# Patient Record
Sex: Female | Born: 1950 | Race: Black or African American | Hispanic: No | Marital: Single | Smoking: Former smoker
Health system: Southern US, Community
[De-identification: ages and names within clinical notes are randomized; demographics above are authoritative.]

## PROBLEM LIST (undated history)

## (undated) DIAGNOSIS — M069 Rheumatoid arthritis, unspecified: Secondary | ICD-10-CM

## (undated) DIAGNOSIS — I480 Paroxysmal atrial fibrillation: Secondary | ICD-10-CM

## (undated) DIAGNOSIS — E039 Hypothyroidism, unspecified: Secondary | ICD-10-CM

## (undated) DIAGNOSIS — E669 Obesity, unspecified: Secondary | ICD-10-CM

## (undated) DIAGNOSIS — E119 Type 2 diabetes mellitus without complications: Secondary | ICD-10-CM

## (undated) DIAGNOSIS — I749 Embolism and thrombosis of unspecified artery: Secondary | ICD-10-CM

## (undated) DIAGNOSIS — E785 Hyperlipidemia, unspecified: Secondary | ICD-10-CM

## (undated) DIAGNOSIS — I10 Essential (primary) hypertension: Secondary | ICD-10-CM

## (undated) HISTORY — PX: BELOW KNEE LEG AMPUTATION: SUR23

## (undated) HISTORY — DX: Paroxysmal atrial fibrillation: I48.0

---

## 2005-01-02 ENCOUNTER — Other Ambulatory Visit: Payer: Self-pay

## 2005-01-03 ENCOUNTER — Observation Stay: Payer: Self-pay | Admitting: Internal Medicine

## 2005-03-22 ENCOUNTER — Ambulatory Visit: Payer: Self-pay | Admitting: *Deleted

## 2005-05-24 ENCOUNTER — Ambulatory Visit: Payer: Self-pay | Admitting: *Deleted

## 2006-05-17 ENCOUNTER — Ambulatory Visit: Payer: Self-pay | Admitting: Gastroenterology

## 2006-06-06 ENCOUNTER — Ambulatory Visit: Payer: Self-pay | Admitting: *Deleted

## 2006-07-25 ENCOUNTER — Emergency Department: Payer: Self-pay | Admitting: Emergency Medicine

## 2006-07-29 ENCOUNTER — Emergency Department: Payer: Self-pay | Admitting: Emergency Medicine

## 2007-05-04 ENCOUNTER — Encounter: Payer: Self-pay | Admitting: Family Medicine

## 2008-03-25 ENCOUNTER — Ambulatory Visit: Payer: Self-pay | Admitting: Family Medicine

## 2009-03-10 ENCOUNTER — Ambulatory Visit: Payer: Self-pay | Admitting: Family Medicine

## 2010-03-17 ENCOUNTER — Ambulatory Visit: Payer: Self-pay | Admitting: Family Medicine

## 2011-04-11 ENCOUNTER — Ambulatory Visit: Payer: Self-pay | Admitting: Family Medicine

## 2012-04-24 ENCOUNTER — Ambulatory Visit: Payer: Self-pay | Admitting: Family Medicine

## 2013-03-27 ENCOUNTER — Ambulatory Visit: Payer: Self-pay | Admitting: Rheumatology

## 2013-11-18 ENCOUNTER — Ambulatory Visit: Payer: Self-pay | Admitting: Family Medicine

## 2013-11-25 ENCOUNTER — Ambulatory Visit: Payer: Self-pay | Admitting: Family Medicine

## 2018-02-07 ENCOUNTER — Inpatient Hospital Stay
Admission: EM | Admit: 2018-02-07 | Discharge: 2018-02-09 | DRG: 602 | Disposition: A | Discharge status: Home / Self Care | Payer: Medicare Other | Attending: Family Medicine | Admitting: Family Medicine

## 2018-02-07 ENCOUNTER — Other Ambulatory Visit: Payer: Self-pay

## 2018-02-07 ENCOUNTER — Emergency Department: Payer: Medicare Other

## 2018-02-07 DIAGNOSIS — Z8261 Family history of arthritis: Secondary | ICD-10-CM

## 2018-02-07 DIAGNOSIS — Z8249 Family history of ischemic heart disease and other diseases of the circulatory system: Secondary | ICD-10-CM

## 2018-02-07 DIAGNOSIS — Z8349 Family history of other endocrine, nutritional and metabolic diseases: Secondary | ICD-10-CM

## 2018-02-07 DIAGNOSIS — D649 Anemia, unspecified: Secondary | ICD-10-CM | POA: Diagnosis present

## 2018-02-07 DIAGNOSIS — E785 Hyperlipidemia, unspecified: Secondary | ICD-10-CM | POA: Diagnosis present

## 2018-02-07 DIAGNOSIS — I509 Heart failure, unspecified: Secondary | ICD-10-CM

## 2018-02-07 DIAGNOSIS — L03115 Cellulitis of right lower limb: Secondary | ICD-10-CM

## 2018-02-07 DIAGNOSIS — I959 Hypotension, unspecified: Secondary | ICD-10-CM | POA: Diagnosis not present

## 2018-02-07 DIAGNOSIS — I110 Hypertensive heart disease with heart failure: Secondary | ICD-10-CM | POA: Diagnosis present

## 2018-02-07 DIAGNOSIS — Z79890 Hormone replacement therapy (postmenopausal): Secondary | ICD-10-CM

## 2018-02-07 DIAGNOSIS — E876 Hypokalemia: Secondary | ICD-10-CM | POA: Diagnosis not present

## 2018-02-07 DIAGNOSIS — J811 Chronic pulmonary edema: Secondary | ICD-10-CM | POA: Diagnosis present

## 2018-02-07 DIAGNOSIS — Z79899 Other long term (current) drug therapy: Secondary | ICD-10-CM

## 2018-02-07 DIAGNOSIS — Z6841 Body Mass Index (BMI) 40.0 and over, adult: Secondary | ICD-10-CM

## 2018-02-07 DIAGNOSIS — Z87891 Personal history of nicotine dependence: Secondary | ICD-10-CM

## 2018-02-07 DIAGNOSIS — Z7901 Long term (current) use of anticoagulants: Secondary | ICD-10-CM

## 2018-02-07 DIAGNOSIS — R739 Hyperglycemia, unspecified: Secondary | ICD-10-CM | POA: Diagnosis not present

## 2018-02-07 DIAGNOSIS — Z86718 Personal history of other venous thrombosis and embolism: Secondary | ICD-10-CM

## 2018-02-07 DIAGNOSIS — I10 Essential (primary) hypertension: Secondary | ICD-10-CM | POA: Diagnosis present

## 2018-02-07 DIAGNOSIS — I480 Paroxysmal atrial fibrillation: Secondary | ICD-10-CM | POA: Diagnosis present

## 2018-02-07 DIAGNOSIS — Z89512 Acquired absence of left leg below knee: Secondary | ICD-10-CM

## 2018-02-07 DIAGNOSIS — E039 Hypothyroidism, unspecified: Secondary | ICD-10-CM | POA: Diagnosis present

## 2018-02-07 DIAGNOSIS — I5031 Acute diastolic (congestive) heart failure: Secondary | ICD-10-CM | POA: Diagnosis present

## 2018-02-07 DIAGNOSIS — J810 Acute pulmonary edema: Secondary | ICD-10-CM

## 2018-02-07 DIAGNOSIS — M7989 Other specified soft tissue disorders: Secondary | ICD-10-CM | POA: Diagnosis not present

## 2018-02-07 DIAGNOSIS — L0390 Cellulitis, unspecified: Secondary | ICD-10-CM | POA: Diagnosis present

## 2018-02-07 DIAGNOSIS — M069 Rheumatoid arthritis, unspecified: Secondary | ICD-10-CM | POA: Diagnosis present

## 2018-02-07 HISTORY — DX: Rheumatoid arthritis, unspecified: M06.9

## 2018-02-07 HISTORY — DX: Embolism and thrombosis of unspecified artery: I74.9

## 2018-02-07 HISTORY — DX: Obesity, unspecified: E66.9

## 2018-02-07 HISTORY — DX: Hypothyroidism, unspecified: E03.9

## 2018-02-07 HISTORY — DX: Hyperlipidemia, unspecified: E78.5

## 2018-02-07 HISTORY — DX: Essential (primary) hypertension: I10

## 2018-02-07 LAB — CBC WITH DIFFERENTIAL/PLATELET
Abs Immature Granulocytes: 0.04 10*3/uL (ref 0.00–0.07)
Basophils Absolute: 0 10*3/uL (ref 0.0–0.1)
Basophils Relative: 0 %
Eosinophils Absolute: 0 10*3/uL (ref 0.0–0.5)
Eosinophils Relative: 0 %
HCT: 38.9 % (ref 36.0–46.0)
HEMOGLOBIN: 12.4 g/dL (ref 12.0–15.0)
Immature Granulocytes: 1 %
LYMPHS PCT: 16 %
Lymphs Abs: 1.3 10*3/uL (ref 0.7–4.0)
MCH: 27.4 pg (ref 26.0–34.0)
MCHC: 31.9 g/dL (ref 30.0–36.0)
MCV: 85.9 fL (ref 80.0–100.0)
Monocytes Absolute: 0.6 10*3/uL (ref 0.1–1.0)
Monocytes Relative: 8 %
Neutro Abs: 6.1 10*3/uL (ref 1.7–7.7)
Neutrophils Relative %: 75 %
Platelets: 269 10*3/uL (ref 150–400)
RBC: 4.53 MIL/uL (ref 3.87–5.11)
RDW: 16.1 % — ABNORMAL HIGH (ref 11.5–15.5)
WBC: 8.1 10*3/uL (ref 4.0–10.5)
nRBC: 0 % (ref 0.0–0.2)

## 2018-02-07 LAB — COMPREHENSIVE METABOLIC PANEL
ALK PHOS: 41 U/L (ref 38–126)
ALT: 11 U/L (ref 0–44)
AST: 20 U/L (ref 15–41)
Albumin: 3.3 g/dL — ABNORMAL LOW (ref 3.5–5.0)
Anion gap: 6 (ref 5–15)
BUN: 12 mg/dL (ref 8–23)
CO2: 27 mmol/L (ref 22–32)
Calcium: 8.5 mg/dL — ABNORMAL LOW (ref 8.9–10.3)
Chloride: 103 mmol/L (ref 98–111)
Creatinine, Ser: 0.94 mg/dL (ref 0.44–1.00)
GFR calc Af Amer: >60 mL/min (ref >60)
GFR calc non Af Amer: >60 mL/min (ref >60)
GLUCOSE: 137 mg/dL — AB (ref 70–99)
Potassium: 3.7 mmol/L (ref 3.5–5.1)
SODIUM: 136 mmol/L (ref 135–145)
TOTAL PROTEIN: 7.9 g/dL (ref 6.5–8.1)
Total Bilirubin: 1.2 mg/dL (ref 0.3–1.2)

## 2018-02-07 LAB — TROPONIN I: Troponin I: <0.03 ng/mL (ref <0.03)

## 2018-02-07 LAB — T4, FREE: Free T4: 1.11 ng/dL (ref 0.82–1.77)

## 2018-02-07 LAB — PROTIME-INR
INR: 2.31
Prothrombin Time: 25.1 seconds — ABNORMAL HIGH (ref 11.4–15.2)

## 2018-02-07 LAB — TSH: TSH: 1.252 u[IU]/mL (ref 0.350–4.500)

## 2018-02-07 LAB — BRAIN NATRIURETIC PEPTIDE: B Natriuretic Peptide: 231 pg/mL — ABNORMAL HIGH (ref 0.0–100.0)

## 2018-02-07 MED ORDER — SODIUM CHLORIDE 0.9 % IV SOLN
2.0000 g | INTRAVENOUS | Status: DC
  Administered 2018-02-08 (×2): 2 g via INTRAVENOUS
  Filled 2018-02-07 (×2): qty 20
  Filled 2018-02-07: qty 2

## 2018-02-07 MED ORDER — FUROSEMIDE 10 MG/ML IJ SOLN
20.0000 mg | Freq: Once | INTRAMUSCULAR | Status: AC
  Administered 2018-02-07: 20 mg via INTRAVENOUS
  Filled 2018-02-07: qty 4

## 2018-02-07 MED ORDER — VANCOMYCIN HCL IN DEXTROSE 1-5 GM/200ML-% IV SOLN
1000.0000 mg | Freq: Once | INTRAVENOUS | Status: AC
  Administered 2018-02-08: 1000 mg via INTRAVENOUS
  Filled 2018-02-07: qty 200

## 2018-02-07 MED ORDER — IPRATROPIUM-ALBUTEROL 0.5-2.5 (3) MG/3ML IN SOLN
3.0000 mL | Freq: Once | RESPIRATORY_TRACT | Status: AC
  Administered 2018-02-07: 3 mL via RESPIRATORY_TRACT
  Filled 2018-02-07: qty 3

## 2018-02-07 NOTE — ED Triage Notes (Signed)
Pt arrived via home with right leg swelling that is more than normal. Family states that right leg is warmer than usual. 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 Pt A&Ox4, audible wheezing on expiration. Vitals WNL.

## 2018-02-07 NOTE — ED Provider Notes (Signed)
[facility] Emergency Department Provider Note  ____________________________________________   First MD Initiated Contact with Patient 02/07/18 2302     (approximate)  I have reviewed the triage vital signs and the nursing notes.   HISTORY  Chief Complaint Leg Swelling   HPI Maryjane Benedict is a 67 y.o. female who self presents to the emergency department with several days of right lower extremity edema and warmth.  She has a complex past medical history including hypertension as well as previous deep vein thrombosis.  She has a remote left above-the-knee amputation for "blood clot".  She has no history of heart disease.  She does sleep on about 3 pillows and does occasionally wake at night short of breath.  She says for the last 3 days or so she is had insidious onset slowly progressive now moderate severity shortness of breath and dry cough.  Worse with exertion.  She is concerned that the swelling in her right leg is progressive and that she could have another blood clot or that it could be infected.  She denies abdominal pain nausea or vomiting.  She does report increasing fatigue.   Her symptoms are improved with rest.   Past Medical History:  Diagnosis Date  . Hypertension     There are no active problems to display for this patient.     Prior to Admission medications   Medication Sig Start Date End Date Taking? Authorizing Provider  gabapentin (NEURONTIN) 600 MG tablet Take 600 mg by mouth 3 (three) times daily.    [provider]    Allergies Patient has no allergy information on record.  No family history on file.  Social History Social History   Tobacco Use  . Smoking status: Not on file  Substance Use Topics  . Alcohol use: Not on file  . Drug use: Not on file    Review of Systems Constitutional: Positive for chills Eyes: No visual changes. ENT: No sore throat. Cardiovascular: Denies chest pain. Respiratory: Positive  for shortness of breath. Gastrointestinal: No abdominal pain.  No nausea, no vomiting.  No diarrhea.  No constipation. Genitourinary: Negative for dysuria. Musculoskeletal: Positive for leg swelling Skin: Positive for rash. Neurological: Negative for headaches, focal weakness or numbness.   ____________________________________________   PHYSICAL EXAM:  VITAL SIGNS: ED Triage Vitals  Enc Vitals Group     BP 02/07/18 2246 134/68     Pulse Rate 02/07/18 2247 87     Resp 02/07/18 2248 20     Temp 02/07/18 2248 98.7 F (37.1 C)     Temp Source 02/07/18 2248 Oral     SpO2 02/07/18 2247 98 %     Weight 02/07/18 2248 (!) 400 lb (181.4 kg)     Height 02/07/18 2248 5\' 6"  (1.676 m)     Head Circumference --      Peak Flow --      Pain Score 02/07/18 2248 8     Pain Loc --      Pain Edu? --      Excl. in GC? --     Constitutional: Alert and oriented x4 appears obviously somewhat short of breath although nontoxic in no diaphoresis Eyes: PERRL EOMI. Head: Atraumatic. Nose: No congestion/rhinnorhea. Mouth/Throat: No trismus Neck: Difficulty when lying completely flat and significant JVD Cardiovascular: Normal rate, regular rhythm. Grossly normal heart sounds.  Good peripheral circulation. Respiratory: Increased respiratory effort with expiratory wheeze in all fields lung sounds are equal bilaterally lung sounds are  predominantly worse at the bases Gastrointestinal: Super morbidly obese Musculoskeletal: Above-the-knee amputation on the left.  Right lower extremity extremely edematous with cracked and warm skin.  Overlying erythema.  No purulence.  Roughly 3+ pitting edema to the upper thigh. Unable to palpate a pulse but she has biphasic DP on the right foot and brisk capillary refill Neurologic:  Normal speech and language. No gross focal neurologic deficits are appreciated. Skin: Cracked warm and cellulitic appearing skin as above Psychiatric: Mood and affect are normal. Speech and  behavior are normal.    ____________________________________________   DIFFERENTIAL includes but not limited to  Heart failure, DVT, acute pulmonary edema, bronchitis, pneumonia viral syndrome ____________________________________________   LABS (all labs ordered are listed, but only abnormal results are displayed)  Labs Reviewed  COMPREHENSIVE METABOLIC PANEL - Abnormal; Notable for the following components:      Result Value   Glucose, Bld 137 (*)    Calcium 8.5 (*)    Albumin 3.3 (*)    All other components within normal limits  BRAIN NATRIURETIC PEPTIDE - Abnormal; Notable for the following components:   B Natriuretic Peptide 231.0 (*)    All other components within normal limits  CBC WITH DIFFERENTIAL/PLATELET - Abnormal; Notable for the following components:   RDW 16.1 (*)    All other components within normal limits  PROTIME-INR - Abnormal; Notable for the following components:   Prothrombin Time 25.1 (*)    All other components within normal limits  TROPONIN I  TSH  T4, FREE  URINALYSIS, COMPLETE (UACMP) WITH MICROSCOPIC  INFLUENZA PANEL BY PCR (TYPE A & B)    Lab work reviewed by me with elevated beta natruretic peptide despite super morbid obesity concerning for acute pulmonary edema __________________________________________  EKG  ED ECG REPORT I, Merrily Brittle, the attending physician, personally viewed and interpreted this ECG.  Date: 02/07/2018 EKG Time:  Rate: 85 Rhythm: normal sinus rhythm QRS Axis: normal Intervals: normal ST/T Wave abnormalities: normal Narrative Interpretation: no evidence of acute ischemia  ____________________________________________  RADIOLOGY  Chest x-ray reviewed by me consistent with acute pulmonary edema ____________________________________________   PROCEDURES  Procedure(s) performed: no  Procedures  Critical Care performed: no  ____________________________________________   INITIAL IMPRESSION /  ASSESSMENT AND PLAN / ED COURSE  Pertinent labs & imaging results that were available during my care of the patient were reviewed by me and considered in my medical decision making (see chart for details).   As part of my medical decision making, I reviewed the following data within the electronic MEDICAL RECORD NUMBER History obtained from family if available, nursing notes, old chart and ekg, as well as notes from prior ED visits.  The patient comes to the emergency department short of breath with clinical fluid overload with JVD difficulty lying flat and significant right lower extremity edema.  Her right lower extremity does appear to be superinfected.  She is crackly throughout and I think she most likely has new onset heart failure leading to worsening edema and cellulitis and not a recurrent DVT.  I added on an INR and will check a right lower extremity ultrasound however I will cover her with ceftriaxone and vancomycin according to our antibiogram and begin diuresis with 20 mg of IV Lasix as she is Lasix nave.  As this would be new onset heart failure she requires inpatient admission for echocardiogram, monitoring of her fluid status, and cardiology consultation.  The patient verbalizes understanding and agreement with the plan.  I  then discussed with the hospitalist who has graciously agreed to admit the patient to his service.      ____________________________________________   FINAL CLINICAL IMPRESSION(S) / ED DIAGNOSES  Final diagnoses:  Acute pulmonary edema (HCC)  Acute heart failure, unspecified heart failure type (HCC)  Cellulitis of right lower extremity  Morbid obesity (HCC)      NEW MEDICATIONS STARTED DURING THIS VISIT:  New Prescriptions   No medications on file     Note:  This document was prepared using Dragon voice recognition software and may include unintentional dictation errors.    Merrily Brittle, MD 02/08/18 [phone]

## 2018-02-08 ENCOUNTER — Other Ambulatory Visit: Payer: Self-pay

## 2018-02-08 ENCOUNTER — Encounter: Payer: Self-pay | Admitting: Internal Medicine

## 2018-02-08 ENCOUNTER — Observation Stay: Payer: Medicare Other

## 2018-02-08 ENCOUNTER — Inpatient Hospital Stay (HOSPITAL_COMMUNITY)
Admit: 2018-02-08 | Discharge: 2018-02-08 | Disposition: A | Discharge status: Home / Self Care | Payer: Medicare Other | Attending: Internal Medicine | Admitting: Internal Medicine

## 2018-02-08 DIAGNOSIS — Z87891 Personal history of nicotine dependence: Secondary | ICD-10-CM | POA: Diagnosis not present

## 2018-02-08 DIAGNOSIS — Z6841 Body Mass Index (BMI) 40.0 and over, adult: Secondary | ICD-10-CM | POA: Diagnosis not present

## 2018-02-08 DIAGNOSIS — Z89512 Acquired absence of left leg below knee: Secondary | ICD-10-CM | POA: Diagnosis not present

## 2018-02-08 DIAGNOSIS — M7989 Other specified soft tissue disorders: Secondary | ICD-10-CM | POA: Diagnosis present

## 2018-02-08 DIAGNOSIS — R739 Hyperglycemia, unspecified: Secondary | ICD-10-CM | POA: Diagnosis not present

## 2018-02-08 DIAGNOSIS — E785 Hyperlipidemia, unspecified: Secondary | ICD-10-CM | POA: Diagnosis present

## 2018-02-08 DIAGNOSIS — J810 Acute pulmonary edema: Secondary | ICD-10-CM | POA: Diagnosis not present

## 2018-02-08 DIAGNOSIS — Z8249 Family history of ischemic heart disease and other diseases of the circulatory system: Secondary | ICD-10-CM | POA: Diagnosis not present

## 2018-02-08 DIAGNOSIS — E876 Hypokalemia: Secondary | ICD-10-CM | POA: Diagnosis not present

## 2018-02-08 DIAGNOSIS — Z79899 Other long term (current) drug therapy: Secondary | ICD-10-CM | POA: Diagnosis not present

## 2018-02-08 DIAGNOSIS — Z79890 Hormone replacement therapy (postmenopausal): Secondary | ICD-10-CM | POA: Diagnosis not present

## 2018-02-08 DIAGNOSIS — M069 Rheumatoid arthritis, unspecified: Secondary | ICD-10-CM | POA: Diagnosis present

## 2018-02-08 DIAGNOSIS — E039 Hypothyroidism, unspecified: Secondary | ICD-10-CM | POA: Diagnosis present

## 2018-02-08 DIAGNOSIS — Z86718 Personal history of other venous thrombosis and embolism: Secondary | ICD-10-CM | POA: Diagnosis not present

## 2018-02-08 DIAGNOSIS — Z8261 Family history of arthritis: Secondary | ICD-10-CM | POA: Diagnosis not present

## 2018-02-08 DIAGNOSIS — I5031 Acute diastolic (congestive) heart failure: Secondary | ICD-10-CM | POA: Diagnosis not present

## 2018-02-08 DIAGNOSIS — I10 Essential (primary) hypertension: Secondary | ICD-10-CM | POA: Diagnosis present

## 2018-02-08 DIAGNOSIS — I959 Hypotension, unspecified: Secondary | ICD-10-CM | POA: Diagnosis not present

## 2018-02-08 DIAGNOSIS — I110 Hypertensive heart disease with heart failure: Secondary | ICD-10-CM | POA: Diagnosis present

## 2018-02-08 DIAGNOSIS — I480 Paroxysmal atrial fibrillation: Secondary | ICD-10-CM | POA: Diagnosis present

## 2018-02-08 DIAGNOSIS — R000 Tachycardia, unspecified: Secondary | ICD-10-CM

## 2018-02-08 DIAGNOSIS — Z7901 Long term (current) use of anticoagulants: Secondary | ICD-10-CM | POA: Diagnosis not present

## 2018-02-08 DIAGNOSIS — L03115 Cellulitis of right lower limb: Secondary | ICD-10-CM | POA: Diagnosis present

## 2018-02-08 DIAGNOSIS — Z8349 Family history of other endocrine, nutritional and metabolic diseases: Secondary | ICD-10-CM | POA: Diagnosis not present

## 2018-02-08 DIAGNOSIS — D649 Anemia, unspecified: Secondary | ICD-10-CM | POA: Diagnosis present

## 2018-02-08 DIAGNOSIS — J811 Chronic pulmonary edema: Secondary | ICD-10-CM | POA: Diagnosis present

## 2018-02-08 DIAGNOSIS — L0390 Cellulitis, unspecified: Secondary | ICD-10-CM | POA: Diagnosis present

## 2018-02-08 LAB — MAGNESIUM: Magnesium: 1.7 mg/dL (ref 1.7–2.4)

## 2018-02-08 LAB — CBC
HCT: 33.4 % — ABNORMAL LOW (ref 36.0–46.0)
HEMOGLOBIN: 10.5 g/dL — AB (ref 12.0–15.0)
MCH: 26.9 pg (ref 26.0–34.0)
MCHC: 31.4 g/dL (ref 30.0–36.0)
MCV: 85.4 fL (ref 80.0–100.0)
Platelets: 232 10*3/uL (ref 150–400)
RBC: 3.91 MIL/uL (ref 3.87–5.11)
RDW: 16 % — ABNORMAL HIGH (ref 11.5–15.5)
WBC: 7.8 10*3/uL (ref 4.0–10.5)
nRBC: 0 % (ref 0.0–0.2)

## 2018-02-08 LAB — URINALYSIS, COMPLETE (UACMP) WITH MICROSCOPIC
Bilirubin Urine: NEGATIVE
GLUCOSE, UA: NEGATIVE mg/dL
Ketones, ur: NEGATIVE mg/dL
Leukocytes, UA: NEGATIVE
Nitrite: NEGATIVE
Protein, ur: NEGATIVE mg/dL
Specific Gravity, Urine: 1.005 (ref 1.005–1.030)
Squamous Epithelial / HPF: NONE SEEN (ref 0–5)
WBC, UA: NONE SEEN WBC/hpf (ref 0–5)
pH: 5 (ref 5.0–8.0)

## 2018-02-08 LAB — BASIC METABOLIC PANEL
Anion gap: 7 (ref 5–15)
BUN: 12 mg/dL (ref 8–23)
CO2: 26 mmol/L (ref 22–32)
Calcium: 8 mg/dL — ABNORMAL LOW (ref 8.9–10.3)
Chloride: 102 mmol/L (ref 98–111)
Creatinine, Ser: 0.9 mg/dL (ref 0.44–1.00)
GFR calc Af Amer: >60 mL/min (ref >60)
Glucose, Bld: 156 mg/dL — ABNORMAL HIGH (ref 70–99)
Potassium: 3.1 mmol/L — ABNORMAL LOW (ref 3.5–5.1)
Sodium: 135 mmol/L (ref 135–145)

## 2018-02-08 LAB — ECHOCARDIOGRAM COMPLETE
Height: 66 in
Weight: 6456 oz

## 2018-02-08 LAB — INFLUENZA PANEL BY PCR (TYPE A & B)
Influenza A By PCR: NEGATIVE
Influenza B By PCR: NEGATIVE

## 2018-02-08 LAB — HEMOGLOBIN A1C
Hgb A1c MFr Bld: 6.1 % — ABNORMAL HIGH (ref 4.8–5.6)
Mean Plasma Glucose: 128.37 mg/dL

## 2018-02-08 LAB — LACTIC ACID, PLASMA: LACTIC ACID, VENOUS: 1.3 mmol/L (ref 0.5–1.9)

## 2018-02-08 LAB — TSH: TSH: 1.45 u[IU]/mL (ref 0.350–4.500)

## 2018-02-08 MED ORDER — DILTIAZEM HCL-DEXTROSE 100-5 MG/100ML-% IV SOLN (PREMIX)
5.0000 mg/h | INTRAVENOUS | Status: DC
  Administered 2018-02-08: 5 mg/h via INTRAVENOUS
  Filled 2018-02-08: qty 100

## 2018-02-08 MED ORDER — ONDANSETRON HCL 4 MG/2ML IJ SOLN: 4.0000 mg | Freq: Four times a day (QID) | INTRAMUSCULAR | Status: DC | PRN

## 2018-02-08 MED ORDER — POTASSIUM CHLORIDE 20 MEQ PO PACK: 60.0000 meq | PACK | Freq: Once | ORAL | Status: DC

## 2018-02-08 MED ORDER — GABAPENTIN 600 MG PO TABS
600.0000 mg | ORAL_TABLET | Freq: Three times a day (TID) | ORAL | Status: DC
  Administered 2018-02-08 – 2018-02-09 (×4): 600 mg via ORAL
  Filled 2018-02-08 (×4): qty 1

## 2018-02-08 MED ORDER — DILTIAZEM HCL 25 MG/5ML IV SOLN
10.0000 mg | Freq: Once | INTRAVENOUS | Status: AC
  Administered 2018-02-08: 10 mg via INTRAVENOUS
  Filled 2018-02-08: qty 5

## 2018-02-08 MED ORDER — AMIODARONE HCL IN DEXTROSE 360-4.14 MG/200ML-% IV SOLN
60.0000 mg/h | INTRAVENOUS | Status: AC
  Administered 2018-02-08: 60 mg/h via INTRAVENOUS
  Filled 2018-02-08: qty 200

## 2018-02-08 MED ORDER — ACETAMINOPHEN 325 MG PO TABS
650.0000 mg | ORAL_TABLET | Freq: Four times a day (QID) | ORAL | Status: DC | PRN
  Administered 2018-02-08 – 2018-02-09 (×3): 650 mg via ORAL
  Filled 2018-02-08 (×3): qty 2

## 2018-02-08 MED ORDER — WARFARIN - PHARMACIST DOSING INPATIENT
Freq: Every day | Status: DC
  Administered 2018-02-08

## 2018-02-08 MED ORDER — SODIUM CHLORIDE 0.9 % IV SOLN
Freq: Once | INTRAVENOUS | Status: AC
  Administered 2018-02-08: 20:00:00 via INTRAVENOUS

## 2018-02-08 MED ORDER — ONDANSETRON HCL 4 MG PO TABS: 4.0000 mg | ORAL_TABLET | Freq: Four times a day (QID) | ORAL | Status: DC | PRN

## 2018-02-08 MED ORDER — FUROSEMIDE 10 MG/ML IJ SOLN
20.0000 mg | Freq: Once | INTRAMUSCULAR | Status: AC
  Administered 2018-02-08: 20 mg via INTRAVENOUS
  Filled 2018-02-08: qty 2

## 2018-02-08 MED ORDER — MAGNESIUM SULFATE 2 GM/50ML IV SOLN
2.0000 g | Freq: Once | INTRAVENOUS | Status: AC
  Administered 2018-02-08: 2 g via INTRAVENOUS
  Filled 2018-02-08: qty 50

## 2018-02-08 MED ORDER — SODIUM CHLORIDE 0.9 % IV SOLN
INTRAVENOUS | Status: DC | PRN
  Administered 2018-02-08: 1000 mL via INTRAVENOUS

## 2018-02-08 MED ORDER — LEVOTHYROXINE SODIUM 137 MCG PO TABS
137.0000 ug | ORAL_TABLET | Freq: Every day | ORAL | Status: DC
  Administered 2018-02-08 – 2018-02-09 (×2): 137 ug via ORAL
  Filled 2018-02-08 (×2): qty 1

## 2018-02-08 MED ORDER — WARFARIN SODIUM 10 MG PO TABS
10.0000 mg | ORAL_TABLET | Freq: Every day | ORAL | Status: DC
  Administered 2018-02-08: 10 mg via ORAL
  Filled 2018-02-08: qty 1

## 2018-02-08 MED ORDER — POTASSIUM CHLORIDE 20 MEQ PO PACK
60.0000 meq | PACK | Freq: Two times a day (BID) | ORAL | Status: AC
  Administered 2018-02-08 (×2): 60 meq via ORAL
  Filled 2018-02-08 (×2): qty 3

## 2018-02-08 MED ORDER — IPRATROPIUM-ALBUTEROL 0.5-2.5 (3) MG/3ML IN SOLN: 3.0000 mL | RESPIRATORY_TRACT | Status: DC | PRN

## 2018-02-08 MED ORDER — VANCOMYCIN HCL 10 G IV SOLR
1250.0000 mg | Freq: Three times a day (TID) | INTRAVENOUS | Status: DC
  Administered 2018-02-08 – 2018-02-09 (×3): 1250 mg via INTRAVENOUS
  Filled 2018-02-08 (×5): qty 1250

## 2018-02-08 MED ORDER — ACETAMINOPHEN 650 MG RE SUPP: 650.0000 mg | Freq: Four times a day (QID) | RECTAL | Status: DC | PRN

## 2018-02-08 MED ORDER — AMIODARONE HCL IN DEXTROSE 360-4.14 MG/200ML-% IV SOLN
30.0000 mg/h | INTRAVENOUS | Status: DC
  Administered 2018-02-08 – 2018-02-09 (×2): 30 mg/h via INTRAVENOUS
  Filled 2018-02-08: qty 200

## 2018-02-08 MED ORDER — PRAVASTATIN SODIUM 20 MG PO TABS
20.0000 mg | ORAL_TABLET | Freq: Every day | ORAL | Status: DC
  Administered 2018-02-08: 20 mg via ORAL
  Filled 2018-02-08: qty 1

## 2018-02-08 NOTE — Progress Notes (Signed)
   02/08/18 1620  Clinical Encounter Type  Visited With Patient and family together  Visit Type Initial;Spiritual support  Referral From Nurse  Consult/Referral To Chaplain  Spiritual Encounters  Spiritual Needs Brochure;Other (Comment)   CH received an OR to educate the patient on the AD process. After speaking with Kayla Reyes, she decided that she would wait to do her AD if she deems necessary.

## 2018-02-08 NOTE — Care Management Note (Signed)
Case Management Note  Patient Details  Name: Kayla Reyes MRN: [id number] Date of Birth: [birth year]  Subjective/Objective:       Patient is from home with sister and son.  Admitted with right leg swelling and pulmonary edema.  No history of CHF.  BNP mildly elevated.  In IV antibiotics and IV lasix.   Patient is current with her PCP.  Obtains medications at [facility] in [place].  Denies difficulties accessing medical care or affording medications.  She is currently on room air.  Uses a wheelchair at home to get around.  Left [facility] in 2008.  Offered home health services and patient declines at this time.  Will continue to follow through discharge.            Action/Plan:   Expected Discharge Date:                  Expected Discharge Plan:  Home/Self Care  In-House Referral:     Discharge planning Services  CM Consult  Post Acute Care Choice:    Choice offered to:     DME Arranged:    DME Agency:     HH Arranged:  Patient Refused HH HH Agency:     Status of Service:  In process, will continue to follow  If discussed at Long Length of Stay Meetings, dates discussed:    Additional Comments:  Sherren Kerns, RN 02/08/2018, 1:38 PM

## 2018-02-08 NOTE — Progress Notes (Signed)
*  PRELIMINARY RESULTS* Echocardiogram 2D Echocardiogram has been performed.  Kayla Reyes 02/08/2018, 10:49 AM

## 2018-02-08 NOTE — Plan of Care (Signed)
  Problem: Spiritual Needs Goal: Ability to function at adequate level Outcome: Progressing   Problem: Education: Goal: Knowledge of General Education information will improve Description Including pain rating scale, medication(s)/side effects and non-pharmacologic comfort measures Outcome: Progressing   Problem: Health Behavior/Discharge Planning: Goal: Ability to manage health-related needs will improve Outcome: Progressing   Problem: Clinical Measurements: Goal: Ability to maintain clinical measurements within normal limits will improve Outcome: Progressing Goal: Will remain free from infection Outcome: Progressing Goal: Diagnostic test results will improve Outcome: Progressing Goal: Respiratory complications will improve Outcome: Progressing Goal: Cardiovascular complication will be avoided Outcome: Progressing   Problem: Pain Managment: Goal: General experience of comfort will improve Outcome: Progressing   Problem: Education: Goal: Ability to demonstrate management of disease process will improve Outcome: Progressing Goal: Ability to verbalize understanding of medication therapies will improve Outcome: Progressing Goal: Individualized Educational Video(s) Outcome: Progressing   Problem: Cardiac: Goal: Ability to achieve and maintain adequate cardiopulmonary perfusion will improve Outcome: Progressing

## 2018-02-08 NOTE — Progress Notes (Signed)
Call received by this RN from central telemetry.  Patient heart rate in 180-200.  Patient asymptomatic, VSS.  This RN called Dr. Vivia Ewing and new orders received for one time cardizem push and EKG.  Patient showing AFIB on EKG.  Cardizem pushed.  Heart rate down to 110's-120's at the moment.  Patient without complaints.

## 2018-02-08 NOTE — Progress Notes (Signed)
Patient in acute afib in the 170s. Dr, Starleen Blue at the bedside. New order of Diltiazem infusion received. Diltiazem infusion started per order.

## 2018-02-08 NOTE — Progress Notes (Signed)
Pharmacy Antibiotic Note  Kayla Reyes is a 66 y.o. female admitted on 02/07/2018 with cellulitis.  Pharmacy has been consulted for vancomycin dosing.  Plan: DW 108kg  Vd 76L kei 0.089 hr-1  T1/2 8 hours Vancomycin 1250 mg q 8 hours ordered with stacked dosing. Level before 5th dose. Goal trough 10-15   Height: 5\' 6"  (167.6 cm) Weight: (!) 403 lb 8 oz (183 kg) IBW/kg (Calculated) : 59.3  Temp (24hrs), Avg:100 F (37.8 C), Min:98.7 F (37.1 C), Max:101.6 F (38.7 C)  Recent Labs  Lab 02/07/18 2259  WBC 8.1  CREATININE 0.94    Estimated Creatinine Clearance: 102.5 mL/min (by C-G formula based on SCr of 0.94 mg/dL).    Not on File  Antimicrobials this admission: Vancomycin, ceftriaxone 12/26 >>    >>   Dose adjustments this admission:   Microbiology results: No micro   Thank you for allowing pharmacy to be a part of this patient'Reyes care.  Kayla Reyes 02/08/2018 5:55 AM

## 2018-02-08 NOTE — Progress Notes (Signed)
   02/08/18 0800  Clinical Encounter Type  Visited With Patient;Family  Visit Type Initial;Spiritual support (AD Education/Creation)  Referral From Nurse  Recommendations Follow-up later today.   OR request for AD received. Upon arrival, patient was sleeping. Her daughters asked for Chaplain to return later in the day for AD creation/update.

## 2018-02-08 NOTE — Progress Notes (Signed)
Dr, Anne Hahn was notified of patient's SBP being in the 90s, while patient still in the afib in the 170s and Diltiazem infusion, and patient's last magnesium of 1.7. New order to discontinue Diltiazem infusion was stopped. 50mg  of magnesium IV and Amioderone infusion at 60mg /hr   started per order .

## 2018-02-08 NOTE — Consult Note (Signed)
Cardiology Consultation:   Patient ID: Kayla Reyes; [id number]; 1950-06-28   Admit date: 02/07/2018 Date of Consult: 02/08/2018  Primary Care Provider: Leanna Sato, MD Primary Cardiologist: New to [facility] - consult by Kirke Corin   Patient Profile:   Kayla Reyes is a 67 y.o. 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 Anne Hahn.  History of Present Illness:   Ms. Brower has no previously known cardiac history. She notes a long history of swelling in the right leg, though over the past 1-2 weeks this swelling has been worse with associated pain, warmth, and erythema. In this setting, she has noted some increased SOB and cough that has been productive of clear sputum with associated wheezing. She has stable 2 pillow orthopnea and denies any PND, abdominal distension, or early satiety. No chest pain, palpitations, dizziness, presyncope, or syncope. She does not know her baseline weight as she does not weigh herself at home. She does not watch her diet for salt.   Upon the patient's arrival to [facility] they were found to have stable BP and heart rate. T max 101.6 this morning. EKG showed no acute changes as below, CXR showed aortic calcifications with slightly increased interstitial markings on the right. Lower extremity venous ultrasound was a limited study, though negative for DVT. Labs showed troponin negative x 1, BNP 231, K+ 3.7-->3.1, SCr 0.94-->0.90, albumin 3.3, WBC 8.1, HGB 12.4-->10.5, TSH normal, INR 2.31, influenza negative. Since her admission, she has received IV Lasix 20 mg x 2, Dounebs, and IV Rocephin and vancomycin for lower extremity cellulitis.  Cardiology asked to evaluate for possible CHF component. Currently, notes some improvement in her SOB and cough following IV Lasix as above. Echo is pending.    Past Medical History:  Diagnosis Date  . Arterial thrombosis (HCC)    a. s/p thromboectomy and thrombolysis with left BKA in 2008 on chronic Coumadin   . HLD (hyperlipidemia)   . HLD (hyperlipidemia)   . Hypertension   . Hypothyroidism   . Obesity   . Rheumatoid arthritis [facility])     Past Surgical History:  Procedure Laterality Date  . BELOW KNEE LEG AMPUTATION       Home Meds: Prior to Admission medications   Medication Sig Start Date End Date Taking? Authorizing Provider  albuterol (PROVENTIL HFA;VENTOLIN HFA) 108 (90 Base) MCG/ACT inhaler Inhale 2 puffs into the lungs every 6 (six) hours as needed for wheezing or shortness of breath.   Yes [provider]  atenolol-chlorthalidone (TENORETIC) 50-25 MG tablet Take 1 tablet by mouth at bedtime.   Yes [provider]  fluticasone (FLONASE) 50 MCG/ACT nasal spray Place 1 spray into both nostrils daily as needed for allergies or rhinitis.   Yes [provider]  gabapentin (NEURONTIN) 600 MG tablet Take 600 mg by mouth 3 (three) times daily.   Yes [provider]  levothyroxine (SYNTHROID, LEVOTHROID) 137 MCG tablet Take 137 mcg by mouth daily before breakfast.   Yes [provider]  lovastatin (MEVACOR) 20 MG tablet Take 20 mg by mouth every evening.   Yes [provider]  warfarin (COUMADIN) 10 MG tablet Take 10 mg  by mouth daily.   Yes [provider]    Inpatient Medications: Scheduled Meds: . gabapentin  600 mg Oral TID  . levothyroxine  137 mcg Oral QAC breakfast  . potassium chloride  60 mEq Oral Once  . pravastatin  20 mg Oral q1800  . warfarin  10 mg Oral q1800   Continuous Infusions: . cefTRIAXone (ROCEPHIN)  IV Stopped (02/08/18 0124)  . vancomycin     PRN Meds: acetaminophen **OR** acetaminophen,  ipratropium-albuterol, ondansetron **OR** ondansetron (ZOFRAN) IV  Allergies:  Not on File  Social History:   Social History   Socioeconomic History  . Marital status: Single    Spouse name: Not on file  . Number of children: Not on file  . Years of education: Not on file  . Highest education level: Not on file  Occupational History  . Not on file  Social Needs  . Financial resource strain: Not on file  . Food insecurity:    Worry: Not on file    Inability: Not on file  . Transportation needs:    Medical: Not on file    Non-medical: Not on file  Tobacco Use  . Smoking status: Former Games developer  . Smokeless tobacco: Never Used  Substance and Sexual Activity  . Alcohol use: Not Currently    Frequency: Never  . Drug use: Not Currently  . Sexual activity: Not on file  Lifestyle  . Physical activity:    Days per week: Not on file    Minutes per session: Not on file  . Stress: Not on file  Relationships  . Social connections:    Talks on phone: Not on file    Gets together: Not on file    Attends religious service: Not on file    Active member of club or organization: Not on file    Attends meetings of clubs or organizations: Not on file    Relationship status: Not on file  . Intimate partner violence:    Fear of current or ex partner: Not on file    Emotionally abused: Not on file    Physically abused: Not on file    Forced sexual activity: Not on file  Other Topics Concern  . Not on file  Social History Narrative  . Not on file     Family History:   Family History  Problem Relation Age of Onset  . Arthritis Mother   . Hypertension Mother   . Cancer Father   . Thyroid disease Father     ROS:  Review of Systems  Constitutional: Positive for malaise/fatigue. Negative for chills, diaphoresis, fever and weight loss.  HENT: Negative for congestion.   Eyes: Negative for discharge and redness.  Respiratory: Positive for cough, sputum production, shortness of  breath and wheezing. Negative for hemoptysis.        Clear sputum  Cardiovascular: Positive for leg swelling. Negative for chest pain, palpitations, orthopnea, claudication and PND.  Gastrointestinal: Negative for abdominal pain, blood in stool, heartburn, melena, nausea and vomiting.  Genitourinary: Negative for hematuria.  Musculoskeletal: Negative for falls and myalgias.  Skin: Negative for rash.  Neurological: Positive for weakness. Negative for dizziness, tingling, tremors, sensory change, speech change, focal weakness and loss of consciousness.  Endo/Heme/Allergies: Does not bruise/bleed easily.  Psychiatric/Behavioral: Negative for substance abuse. The patient is not nervous/anxious.   All other systems reviewed and are negative.     Physical Exam/Data:   Vitals:   02/08/18 0220 02/08/18 0500 02/08/18 1610  02/08/18 0813  BP: (!) 105/54 (!) 85/55 (!) 102/56 100/61  Pulse: 98 93 85 87  Resp:    20  Temp: (!) 101.6 F (38.7 C) 99.6 F (37.6 C)  99.2 F (37.3 C)  TempSrc: Oral Oral  Oral  SpO2: 100% 97%  100%  Weight: (!) 183 kg     Height:        Intake/Output Summary (Last 24 hours) at 02/08/2018 0838 Last data filed at 02/08/2018 0508 Gross per 24 hour  Intake -  Output 300 ml  Net -300 ml   Filed Weights   02/07/18 2248 02/08/18 0220  Weight: (!) 181.4 kg (!) 183 kg   Body mass index is 65.13 kg/m.   Physical Exam: General: Well developed, well nourished, in no acute distress. Head: Normocephalic, atraumatic, sclera non-icteric, no xanthomas, nares without discharge.  Neck: Negative for carotid bruits. JVD difficult to assess secondary to body habitus. Lungs: Clear bilaterally to auscultation without wheezes, rales, or rhonchi. Breathing is unlabored. Heart: RRR with S1 S2. No murmurs, rubs, or gallops appreciated. Abdomen: Soft, non-tender, non-distended with normoactive bowel sounds. No hepatomegaly. No rebound/guarding. No obvious abdominal masses. Msk:   Strength and tone appear normal for age. Extremities: No clubbing or cyanosis. Status post left BKA. Right lower extremity with 1+ lower extremity swelling to the knee with changes consistent with chronic swelling.  Neuro: Alert and oriented X 3. No facial asymmetry. No focal deficit. Moves all extremities spontaneously. Psych:  Responds to questions appropriately with a normal affect.   EKG:  The EKG was personally reviewed and demonstrates: NSR, 85 bpm, inferior Q waves, low voltage QRS along the precordial leads, no acute st/t changes  Telemetry:  Telemetry was personally reviewed and demonstrates: NSR with PACs  Weights: Filed Weights   02/07/18 2248 02/08/18 0220  Weight: (!) 181.4 kg (!) 183 kg    Relevant CV Studies: Myoview 2006: Negative scan with an EF of 72%.   Laboratory Data:  Chemistry Recent Labs  Lab 02/07/18 2259 02/08/18 0620  NA 136 135  K 3.7 3.1*  CL 103 102  CO2 27 26  GLUCOSE 137* 156*  BUN 12 12  CREATININE 0.94 0.90  CALCIUM 8.5* 8.0*  GFRNONAA >60 >60  GFRAA >60 >60  ANIONGAP 6 7    Recent Labs  Lab 02/07/18 2259  PROT 7.9  ALBUMIN 3.3*  AST 20  ALT 11  ALKPHOS 41  BILITOT 1.2   Hematology Recent Labs  Lab 02/07/18 2259 02/08/18 0620  WBC 8.1 7.8  RBC 4.53 3.91  HGB 12.4 10.5*  HCT 38.9 33.4*  MCV 85.9 85.4  MCH 27.4 26.9  MCHC 31.9 31.4  RDW 16.1* 16.0*  PLT 269 232   Cardiac Enzymes Recent Labs  Lab 02/07/18 2259  TROPONINI <0.03   No results for input(s): TROPIPOC in the last 168 hours.  BNP Recent Labs  Lab 02/07/18 2259  BNP 231.0*    DDimer No results for input(s): DDIMER in the last 168 hours.  Radiology/Studies:  Dg Chest 2 View  Result Date: 02/07/2018 IMPRESSION: Changes most consistent with asymmetric edema within the right lung. Electronically Signed   By: Alcide Clever M.D.   On: 02/07/2018 23:35   US Venous Img Lower Right (dvt Study)  Result Date: 02/08/2018 IMPRESSION: Somewhat limited exam  although no deep venous thrombosis is noted. Electronically Signed   By: Alcide Clever M.D.   On: 02/08/2018 02:11    Assessment and Plan:  1. Mild pulmonary edema: -Not the primary reason for the patient's presentation to the ED or the reason for her admission -BNP is minimally elevated at 231 with CXR demonstrating slight right-sided pulmonary edema -Status post IV Lasix 20 mg x 2 with improvement in her dyspnea  -Will give another dose of IV Lasix 20 mg with KCl repletion  -May need prn Lasix at home for lower extremity swelling, weight gain, SOB -Echo pending to evaluate EF -If echo demonstrates a new cardiomyopathy, ischemic evaluation will be needed, however, for now we will continue Coumadin without transition to heparin gtt  2. Arterial thrombus: -Status post thrombectomy, thrombolysis and left BKA in 2008 with a negative hypercoagulable workup -Chronic Coumadin therapy per IM/pharmacy/PCP  3. Right lower extremity cellulitis with fever: -ABX per IM -Lower extremity venous ultrasound was somewhat limited, though negative for DVT -Patient has been on Coumadin dating back to 2008 with therapeutic INR upon admission making DVT unlikely  4. HTN: -Blood pressure on the soft side -PTA atenolol/chlorthalidone held -Status post IV Lasix as above  5. HLD: -Continue PTA statin  6. Hypokalemia: -Replete to goal 4.0  7. Anemia: -Possibly dilutional -Monitor -Per IM  8. Hyperglycemia: -Check A1c for further risk stratification    For questions or updates, please contact [facility] Please consult [email or web address] for contact info under Cardiology/STEMI.   Signed, Eula Listen, PA-C [facility] Pager: [phone] 02/08/2018, 8:38 AM

## 2018-02-08 NOTE — H&P (Signed)
[facility] -  at [facility]   PATIENT NAME: Kayla Reyes    MR#:  [id number]  DATE OF BIRTH:  1950/08/20  DATE OF ADMISSION:  02/07/2018  PRIMARY CARE PHYSICIAN: Leanna Sato, MD   REQUESTING/REFERRING PHYSICIAN: Lamont Snowball, MD  CHIEF COMPLAINT:   Chief Complaint  Patient presents with  . Leg Swelling    HISTORY OF PRESENT ILLNESS:  Kayla Reyes  is a 67 y.o. female who presents with chief complaint as above.  Patient presents to the ED with complaint of right lower extremity erythema, warmth, tenderness for the past couple of days.  She states that she always has significant swelling in that leg, though it has potentially been worse recently.  She is unable to elucidate a clear timeframe.  However, couple of days ago it became tender and erythematous.  Here in the ED tonight she appears to have some cellulitis in that leg.  She has skin cracking due to significant edema.  She also had some pulmonary edema on chest x-ray and a mildly elevated BNP, raising concern for the possibility of heart failure.  Hospitalist were called for treatment of cellulitis and further evaluation of heart failure.  PAST MEDICAL HISTORY:   Past Medical History:  Diagnosis Date  . Arterial thrombosis (HCC)   . HLD (hyperlipidemia)   . Hypertension   . Hypothyroidism   . Rheumatoid arthritis (HCC)      PAST SURGICAL HISTORY:   Past Surgical History:  Procedure Laterality Date  . BELOW KNEE LEG AMPUTATION       SOCIAL HISTORY:   Social History   Tobacco Use  . Smoking status: Former Smoker  Substance Use Topics  . Alcohol use: Not Currently    Frequency: Never     FAMILY HISTORY:   Family History  Problem Relation Age of Onset  . Arthritis Mother   . Hypertension Mother   . Cancer Father   . Thyroid disease Father      DRUG ALLERGIES:  Not on File  MEDICATIONS AT HOME:   Prior to Admission medications   Medication Sig Start Date End  Date Taking? Authorizing Provider  albuterol (PROVENTIL HFA;VENTOLIN HFA) 108 (90 Base) MCG/ACT inhaler Inhale 2 puffs into the lungs every 6 (six) hours as needed for wheezing or shortness of breath.   Yes [provider]  atenolol-chlorthalidone (TENORETIC) 50-25 MG tablet Take 1 tablet by mouth at bedtime.   Yes [provider]  fluticasone (FLONASE) 50 MCG/ACT nasal spray Place 1 spray into both nostrils daily as needed for allergies or rhinitis.   Yes [provider]  gabapentin (NEURONTIN) 600 MG tablet Take 600 mg by mouth 3 (three) times daily.   Yes [provider]  levothyroxine (SYNTHROID, LEVOTHROID) 137 MCG tablet Take 137 mcg by mouth daily before breakfast.   Yes [provider]  lovastatin (MEVACOR) 20 MG tablet Take 20 mg by mouth every evening.   Yes [provider]  warfarin (COUMADIN) 10 MG tablet Take 10 mg by mouth daily.   Yes [provider]    REVIEW OF SYSTEMS:  Review of Systems  Constitutional: Negative for chills, fever, malaise/fatigue and weight loss.  HENT: Negative for ear pain, hearing loss and tinnitus.   Eyes: Negative for blurred vision, double vision, pain and redness.  Respiratory: Negative for cough, hemoptysis and shortness of breath.   Cardiovascular: Positive for leg swelling. Negative for chest pain, palpitations and orthopnea.  Gastrointestinal: Negative for abdominal  pain, constipation, diarrhea, nausea and vomiting.  Genitourinary: Negative for dysuria, frequency and hematuria.  Musculoskeletal: Negative for back pain, joint pain and neck pain.  Skin:       Erythema and tenderness of the right lower extremity  Neurological: Negative for dizziness, tremors, focal weakness and weakness.  Endo/Heme/Allergies: Negative for polydipsia. Does not bruise/bleed easily.  Psychiatric/Behavioral: Negative for depression. The patient is not nervous/anxious and does not have insomnia.      VITAL  SIGNS:   Vitals:   02/07/18 2247 02/07/18 2248 02/07/18 2300 02/07/18 2305  BP:  134/68  (!) 113/55  Pulse: 87 87 87 85  Resp:  20 (!) 25 19  Temp:  98.7 F (37.1 C)    TempSrc:  Oral    SpO2: 98% 99% 98% 97%  Weight:  (!) 181.4 kg    Height:  5\' 6"  (1.676 m)     Wt Readings from Last 3 Encounters:  02/07/18 (!) 181.4 kg    PHYSICAL EXAMINATION:  Physical Exam  Vitals reviewed. Constitutional: She is oriented to person, place, and time. She appears well-developed and well-nourished. No distress.  HENT:  Head: Normocephalic and atraumatic.  Mouth/Throat: Oropharynx is clear and moist.  Eyes: Pupils are equal, round, and reactive to light. Conjunctivae and EOM are normal. No scleral icterus.  Neck: Normal range of motion. Neck supple. No JVD present. No thyromegaly present.  Cardiovascular: Normal rate, regular rhythm and intact distal pulses. Exam reveals no gallop and no friction rub.  No murmur heard. Respiratory: Effort normal. No respiratory distress. She has no wheezes. She has rales.  GI: Soft. Bowel sounds are normal. She exhibits no distension. There is no abdominal tenderness.  Musculoskeletal: Normal range of motion.        General: Deformity (Left BKA) and edema present.     Comments: No arthritis, no gout  Lymphadenopathy:    She has no cervical adenopathy.  Neurological: She is alert and oriented to person, place, and time. No cranial nerve deficit.  No dysarthria, no aphasia  Skin: Skin is warm and dry. No rash noted. There is erythema (With tenderness and warmth of the right lower extremity).  Psychiatric: She has a normal mood and affect. Her behavior is normal. Judgment and thought content normal.    LABORATORY PANEL:   CBC Recent Labs  Lab 02/07/18 2259  WBC 8.1  HGB 12.4  HCT 38.9  PLT 269   ------------------------------------------------------------------------------------------------------------------  Chemistries  Recent Labs  Lab  02/07/18 2259  NA 136  K 3.7  CL 103  CO2 27  GLUCOSE 137*  BUN 12  CREATININE 0.94  CALCIUM 8.5*  AST 20  ALT 11  ALKPHOS 41  BILITOT 1.2   ------------------------------------------------------------------------------------------------------------------  Cardiac Enzymes Recent Labs  Lab 02/07/18 2259  TROPONINI <0.03   ------------------------------------------------------------------------------------------------------------------  RADIOLOGY:  Dg Chest 2 View  Result Date: 02/07/2018 CLINICAL DATA:  Shortness of breath and leg swelling EXAM: CHEST - 2 VIEW COMPARISON:  07/25/2006 FINDINGS: Cardiac shadow is within normal limits. Aortic calcifications are seen. The lungs are well aerated bilaterally with slight interstitial markings on the right which may represent some asymmetric edema. No focal infiltrate or effusion is seen. No bony abnormality is noted. IMPRESSION: Changes most consistent with asymmetric edema within the right lung. Electronically Signed   By: 09/24/2006 M.D.   On: 02/07/2018 23:35    EKG:   Orders placed or performed during the hospital encounter of 02/07/18  . EKG 12-Lead  .  EKG 12-Lead    IMPRESSION AND PLAN:  Principal Problem:   Pulmonary edema -concern for possible heart failure.  IV Lasix given in the ED.  BNP was mildly elevated.  We will get an echocardiogram, and a cardiology consult Active Problems:   Cellulitis -IV antibiotics   HTN (hypertension) -continue home meds   Hypothyroidism -home dose thyroid replacement   HLD (hyperlipidemia) -Home dose antilipid  Chart review performed and case discussed with ED provider. Labs, imaging and/or ECG reviewed by provider and discussed with patient/family. Management plans discussed with the patient and/or family.  DVT PROPHYLAXIS: Systemic anticoagulation  GI PROPHYLAXIS:  None  ADMISSION STATUS: Observation  CODE STATUS: Full  TOTAL TIME TAKING CARE OF THIS PATIENT: 40 minutes.    Mark Hassey FIELDING 02/08/2018, 12:35 AM  [facility]  Office  [phone]  CC: Primary care physician; Leanna Sato, MD  Note:  This document was prepared using Dragon voice recognition software and may include unintentional dictation errors.

## 2018-02-08 NOTE — Progress Notes (Signed)
Dr, Anne Hahn was notified of patient conversion  back to NSR. Per Dr. Marjory Sneddon now to be infusing at 30mg /hr.

## 2018-02-08 NOTE — Progress Notes (Signed)
ANTICOAGULATION CONSULT NOTE - Initial Consult  Pharmacy Consult for warfarin dosing Indication: h/o arterial thrombosis  Not on File  Patient Measurements: Height: 5\' 6"  (167.6 cm) Weight: (!) 403 lb 8 oz (183 kg) IBW/kg (Calculated) : 59.3 Heparin Dosing Weight: n/a  Vital Signs: Temp: 101.6 F (38.7 C) (12/26 0220) Temp Source: Oral (12/26 0220) BP: 105/54 (12/26 0220) Pulse Rate: 98 (12/26 0220)  Labs: Recent Labs    02/07/18 2259  HGB 12.4  HCT 38.9  PLT 269  LABPROT 25.1*  INR 2.31  CREATININE 0.94  TROPONINI <0.03    Estimated Creatinine Clearance: 102.5 mL/min (by C-G formula based on SCr of 0.94 mg/dL).   Medical History: Past Medical History:  Diagnosis Date  . Arterial thrombosis (HCC)   . HLD (hyperlipidemia)   . Hypertension   . Hypothyroidism   . Rheumatoid arthritis (HCC)     Medications:  Home dose 10 mg daily  Assessment: INR therapeutic on admission  Goal of Therapy:  INR 2-3    Plan:  Resume home dose as above. Daily INR while on antibiotics.  Gautam Langhorst S 02/08/2018,2:52 AM

## 2018-02-08 NOTE — Care Management Obs Status (Signed)
MEDICARE OBSERVATION STATUS NOTIFICATION   Patient Details  Name: Kayla Reyes MRN: [id number] Date of Birth: [birth year]   Medicare Observation Status Notification Given:  Yes    Sherren Kerns, RN 02/08/2018, 1:35 PM

## 2018-02-08 NOTE — Progress Notes (Signed)
Patient heart rate back up to 170's-190's.  MD aware.  Bolus NS ordered as well as TSH and lactic acid.  MD will be down to see patient.

## 2018-02-08 NOTE — Progress Notes (Signed)
Paged by nurse regarding patient having tachycardia in the 180s. Patient seen and evaluated.  Sitting comfortably.  No chest pain or shortness of breath.  Admitted for right lower extremity cellulitis.  Temperature 100.7.  Obese patient Irregularly irregular with tachycardia Lungs clear to auscultation Lower extremity edema  *Atrial fibrillation with rapid ventricular rate.  IV Cardizem push 10 mg given.  Heart rate continues to be high.  Started on Cardizem drip.  Patient already on warfarin with therapeutic INR.  Sent message to Dr. Kirke Corin for follow-up tomorrow regarding new onset atrial fibrillation.  Will check TSH.  I also ordered a lactic acid to rule out sepsis at this point with the cellulitis. 500 ml NS bolus given.  * RLE cellulitis On IV abx.  Discussed with patient in detail and all questions answered.  Total critical care time spent 35 minutes

## 2018-02-09 LAB — PROTIME-INR
INR: 2.65
Prothrombin Time: 27.9 seconds — ABNORMAL HIGH (ref 11.4–15.2)

## 2018-02-09 LAB — BASIC METABOLIC PANEL
Anion gap: 7 (ref 5–15)
BUN: 11 mg/dL (ref 8–23)
CO2: 26 mmol/L (ref 22–32)
Calcium: 7.9 mg/dL — ABNORMAL LOW (ref 8.9–10.3)
Chloride: 103 mmol/L (ref 98–111)
Creatinine, Ser: 0.86 mg/dL (ref 0.44–1.00)
GFR calc Af Amer: >60 mL/min (ref >60)
GFR calc non Af Amer: >60 mL/min (ref >60)
Glucose, Bld: 159 mg/dL — ABNORMAL HIGH (ref 70–99)
POTASSIUM: 3.8 mmol/L (ref 3.5–5.1)
Sodium: 136 mmol/L (ref 135–145)

## 2018-02-09 LAB — CBC
HCT: 34.7 % — ABNORMAL LOW (ref 36.0–46.0)
HEMOGLOBIN: 11 g/dL — AB (ref 12.0–15.0)
MCH: 27.4 pg (ref 26.0–34.0)
MCHC: 31.7 g/dL (ref 30.0–36.0)
MCV: 86.3 fL (ref 80.0–100.0)
Platelets: 234 10*3/uL (ref 150–400)
RBC: 4.02 MIL/uL (ref 3.87–5.11)
RDW: 16.1 % — ABNORMAL HIGH (ref 11.5–15.5)
WBC: 7.1 10*3/uL (ref 4.0–10.5)
nRBC: 0 % (ref 0.0–0.2)

## 2018-02-09 LAB — VANCOMYCIN, TROUGH: VANCOMYCIN TR: 18 ug/mL (ref 15–20)

## 2018-02-09 LAB — HIV ANTIBODY (ROUTINE TESTING W REFLEX): HIV Screen 4th Generation wRfx: NONREACTIVE

## 2018-02-09 MED ORDER — CEFDINIR 300 MG PO CAPS: 300.0000 mg | ORAL_CAPSULE | Freq: Two times a day (BID) | ORAL | 0 refills | Status: DC

## 2018-02-09 MED ORDER — WARFARIN SODIUM 7.5 MG PO TABS
7.5000 mg | ORAL_TABLET | Freq: Once | ORAL | Status: DC
  Filled 2018-02-09: qty 1

## 2018-02-09 MED ORDER — ATENOLOL 25 MG PO TABS: 75.0000 mg | ORAL_TABLET | Freq: Every day | ORAL | 5 refills | Status: DC

## 2018-02-09 MED ORDER — WARFARIN SODIUM 6 MG PO TABS: 7.0000 mg | ORAL_TABLET | Freq: Once | ORAL | Status: DC

## 2018-02-09 MED ORDER — DOXYCYCLINE HYCLATE 100 MG PO CAPS: 100.0000 mg | ORAL_CAPSULE | Freq: Two times a day (BID) | ORAL | 0 refills | Status: DC

## 2018-02-09 MED ORDER — METOPROLOL TARTRATE 25 MG PO TABS
12.5000 mg | ORAL_TABLET | Freq: Two times a day (BID) | ORAL | Status: DC
  Filled 2018-02-09: qty 1

## 2018-02-09 MED ORDER — FUROSEMIDE 20 MG PO TABS: 20.0000 mg | ORAL_TABLET | Freq: Every day | ORAL | 5 refills | Status: DC

## 2018-02-09 NOTE — Progress Notes (Signed)
Heart Failure Education  Counseled patient on heart failure. Discussed discharge medications, metoprolol and furosemide, decreasing salt intake, OTC medications to avoid, S/S of HF, and when to contact PCP. Discussed weighing daily, but patient is currently unable to weigh herself as she is in a wheelchair. Discussed being mindful of S/S and see doctor regularly. Patient was accepting of counseling and understood.   Mauri Reading, PharmD Pharmacy Resident  02/09/2018 3:16 PM

## 2018-02-09 NOTE — Progress Notes (Signed)
Pt to d/c home per MD order, [facility] has been called for transportation.

## 2018-02-09 NOTE — Progress Notes (Signed)
Progress Note  Patient Name: Kayla Reyes Date of Encounter: 02/09/2018  Primary Cardiologist: new to [facility] - consult by Kirke Corin  Subjective   She developed new onset Afib with RVR at 18:46 on 12/26 with ventricular rates into the 180s bpm. She was given IV Cardizem with slight improvement in rates, though ultimately required IV Cardizem gtt. In this setting, she became hypotensive with SBP in the 80s to 90s mmHg. IV Cardizem gtt was stopped and she was placed on IV amiodarone. Labs showed a magnesium of 1.7, potassium 3.1, TSH normal. She received IV magnesium and potassium was repleted to 3.8 by this morning. She is already anticoagulated with Coumadin with a therapeutic INR given her history of arterial clot occlusion with recommendation for chronic Coumadin per notes. She converted to NSR at 23:02 and has remained in sinus rhythm since. INR this morning of 2.65. She remains on IV amiodarone.   During the above, she noted a "fluttering" in her chest. She reports a prior history of "fluttering" though has never formally had this evaluated or been told she has Afib in the past. Currently feels well and is without chest pain, SOB, or palpitations.   Inpatient Medications    Scheduled Meds: . gabapentin  600 mg Oral TID  . levothyroxine  137 mcg Oral QAC breakfast  . pravastatin  20 mg Oral q1800  . warfarin  7 mg Oral ONCE-1800  . Warfarin - Pharmacist Dosing Inpatient   Does not apply q1800   Continuous Infusions: . sodium chloride 1,000 mL (02/08/18 2235)  . amiodarone 30 mg/hr (02/08/18 2339)  . cefTRIAXone (ROCEPHIN)  IV 2 g (02/08/18 2330)  . vancomycin 1,250 mg (02/09/18 0403)   PRN Meds: sodium chloride, acetaminophen **OR** acetaminophen, ipratropium-albuterol, ondansetron **OR** ondansetron (ZOFRAN) IV   Vital Signs    Vitals:   02/09/18 0200 02/09/18 0400 02/09/18 0559 02/09/18 0602  BP: (!) 95/59 (!) 106/58  107/63  Pulse: 85 88 87 87  Resp:   18   Temp:   99.1  F (37.3 C)   TempSrc:      SpO2:   97%   Weight:    (!) 181.1 kg  Height:        Intake/Output Summary (Last 24 hours) at 02/09/2018 0750 Last data filed at 02/09/2018 0500 Gross per 24 hour  Intake 779.14 ml  Output 1950 ml  Net -1170.86 ml   Filed Weights   02/07/18 2248 02/08/18 0220 02/09/18 0602  Weight: (!) 181.4 kg (!) 183 kg (!) 181.1 kg    Telemetry    Developed new onset Afib with RVR at 18:46-->23:02 NSR with frequent PACs, remains in NSR this this morning with less atrial ectopy - Personally Reviewed  ECG    12/25 - NSR with low voltage; 12/26 - Afib with RVR, 177 bpm, right axis deviation, nonspecific inferolateral st/t changes - Personally Reviewed  Physical Exam   GEN: No acute distress.   Neck: No JVD. Cardiac: RRR, no murmurs, rubs, or gallops.  Respiratory: Clear to auscultation bilaterally.  GI: Obese, soft, nontender, non-distended.   MS: 1+ right lower extremity edema with changes consistent with chronic swelling; Status post left BKA. Neuro:  Alert and oriented x 3; Nonfocal.  Psych: Normal affect.  Labs    Chemistry Recent Labs  Lab 02/07/18 2259 02/08/18 0620 02/09/18 0528  NA 136 135 136  K 3.7 3.1* 3.8  CL 103 102 103  CO2 27 26 26   GLUCOSE 137* 156*  159*  BUN 12 12 11   CREATININE 0.94 0.90 0.86  CALCIUM 8.5* 8.0* 7.9*  PROT 7.9  --   --   ALBUMIN 3.3*  --   --   AST 20  --   --   ALT 11  --   --   ALKPHOS 41  --   --   BILITOT 1.2  --   --   GFRNONAA >60 >60 >60  GFRAA >60 >60 >60  ANIONGAP 6 7 7      Hematology Recent Labs  Lab 02/07/18 2259 02/08/18 0620 02/09/18 0528  WBC 8.1 7.8 7.1  RBC 4.53 3.91 4.02  HGB 12.4 10.5* 11.0*  HCT 38.9 33.4* 34.7*  MCV 85.9 85.4 86.3  MCH 27.4 26.9 27.4  MCHC 31.9 31.4 31.7  RDW 16.1* 16.0* 16.1*  PLT 269 232 234    Cardiac Enzymes Recent Labs  Lab 02/07/18 2259  TROPONINI <0.03   No results for input(s): TROPIPOC in the last 168 hours.   BNP Recent Labs  Lab  02/07/18 2259  BNP 231.0*     DDimer No results for input(s): DDIMER in the last 168 hours.   Radiology    Dg Chest 2 View  Result Date: 02/07/2018 IMPRESSION: Changes most consistent with asymmetric edema within the right lung. Electronically Signed   By: Alcide Clever M.D.   On: 02/07/2018 23:35   US Venous Img Lower Right (dvt Study)  Result Date: 02/08/2018 IMPRESSION: Somewhat limited exam although no deep venous thrombosis is noted. Electronically Signed   By: Alcide Clever M.D.   On: 02/08/2018 02:11    Cardiac Studies   Echo 02/08/2018: Study Conclusions  - Left ventricle: The cavity size was normal. Systolic function was   normal. The estimated ejection fraction was in the range of 55%   to 60%. Wall motion was normal; there were no regional wall   motion abnormalities. Left ventricular diastolic function   parameters were normal. - Pulmonary arteries: Systolic pressure could not be accurately   estimated. - Inferior vena cava: The vessel was normal in size.  Impressions:  - suboptimal study due to morbid obesity.  Patient Profile     67 y.o. 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   Assessment & Plan    1. New onset Afib with RVR; -Brief episode from 18:46-->23:02, likely in the setting of lower extremity cellulitis, hypokalemia and hypomagnesemia  -Converted to NSR with IV amiodarone and has remained in sinus since -Already adequately anticoagulated with Coumadin given her history of arterial clot with recommendation for chronic  Coumadin  -Stop IV amiodarone -Start Lopressor 12.5 mg bid -TSH normal -Recent echo as above -Potassium improved  -Status post magnesium IV repletion -Recommend outpatient cardiac monitoring in follow up to evaluate for recurrent arrhythmia given her reported prior palpitations without formal evaluation   2. Mild pulmonary edema with possible mild diastolic CHF: -Improved following gentle diuresis  -Echo as above -She may require low dose Lasix at discharge  3. Arterial thrombus: -Status post thrombectomy, thrombolysis and left BKA in 2008 with a  negative hypercoagulable workup -Chronic Coumadin therapy per IM/pharmacy/PCP  4. Right lower extremity cellulitis with fever: -ABX per IM -Lower extremity venous ultrasound was somewhat limited, though negative for DVT -Patient has been on Coumadin dating back to 2008 with therapeutic INR upon admission making DVT unlikely  5. HTN: -Blood pressure on the soft side -PTA atenolol/chlorthalidone held -Status post IV Lasix as above -Start low dose Lopressor as above  6. HLD: -Continue PTA statin  7. Hypokalemia: -Replete to goal 4.0  8. Anemia: -Stable  9. Hyperglycemia: -A1c 6.1 -Outpatient follow up    For questions or updates, please contact [facility] Please consult [email or web address] for contact info under Cardiology/STEMI.    Signed, Eula Listen, PA-C [facility] Pager: [phone] 02/09/2018, 7:50 AM

## 2018-02-09 NOTE — Discharge Summary (Signed)
[facility] [place] at [facility]   PATIENT NAME: Kayla Reyes    MR#:  [id number]  DATE OF BIRTH:  08-15-1950  DATE OF ADMISSION:  02/07/2018 ADMITTING PHYSICIAN: Oralia Manis, MD  DATE OF DISCHARGE: No discharge date for patient encounter.  PRIMARY CARE PHYSICIAN: Leanna Sato, MD    ADMISSION DIAGNOSIS:  Morbid obesity (HCC) [E66.01] Acute pulmonary edema (HCC) [J81.0] Cellulitis of right lower extremity [L03.115] Acute heart failure, unspecified heart failure type (HCC) [I50.9]  DISCHARGE DIAGNOSIS:  Principal Problem:   Pulmonary edema Active Problems:   Cellulitis   HTN (hypertension)   Hypothyroidism   HLD (hyperlipidemia)   SECONDARY DIAGNOSIS:   Past Medical History:  Diagnosis Date  . Arterial thrombosis (HCC)    a. s/p thromboectomy and thrombolysis with left BKA in 2008 on chronic Coumadin   . HLD (hyperlipidemia)   . HLD (hyperlipidemia)   . Hypertension   . Hypothyroidism   . Obesity   . Rheumatoid arthritis [facility])     HOSPITAL COURSE:  *Acute right lower extremity cellulitis Resolving Treated empirically on our cellulitis protocol while in house, will discharge on cefdinir/doxycycline, patient follow-up with primary care provider status post discharge for reevaluation  *Acute paroxysmal A. fib with RVR Resolved Cardiology did see patient while in house - recommended increasing beta-blocker therapy, low-dose Lasix, echocardiogram was a normal study, BNP was mildly elevated  *Acute probable mild diastolic congestive heart failure Cardiology did see patient while in house, recommended low-dose Lasix, beta-blocker therapy going forward, will follow with pulmonology status post discharge for continued care going forward  *Chronic benign essential hypertension Able on current regiment  *Chronic hypothyroidism Continue Synthroid  *Chronic hyperlipidemia, unspecified Continue statin therapy   *Chronic morbid obesity   Lifestyle modification recommended   *Chronic lower extremity amputee Home health services status post discharge given limited mobility  DISCHARGE CONDITIONS:   stable  CONSULTS OBTAINED:  Treatment Team:  Iran Ouch, MD  DRUG ALLERGIES:  Not on File  DISCHARGE MEDICATIONS:   Allergies as of 02/09/2018   Not on File     Medication List    STOP taking these medications   atenolol-chlorthalidone 50-25 MG tablet Commonly known as:  TENORETIC     TAKE these medications   albuterol 108 (90 Base) MCG/ACT inhaler Commonly known as:  PROVENTIL HFA;VENTOLIN HFA Inhale 2 puffs into the lungs every 6 (six) hours as needed for wheezing or shortness of breath.   atenolol 25 MG tablet Commonly known as:  TENORMIN Take 3 tablets (75 mg total) by mouth daily.   cefdinir 300 MG capsule Commonly known as:  OMNICEF Take 1 capsule (300 mg total) by mouth 2 (two) times daily.   doxycycline 100 MG capsule Commonly known as:  VIBRAMYCIN Take 1 capsule (100 mg total) by mouth 2 (two) times daily.   FLONASE 50 MCG/ACT nasal spray Generic drug:  fluticasone Place 1 spray into both nostrils daily as needed for allergies or rhinitis.   furosemide 20 MG tablet Commonly known as:  LASIX Take 1 tablet (20 mg total) by mouth daily.   gabapentin 600 MG tablet Commonly known as:  NEURONTIN Take 600 mg by mouth 3 (three) times daily.   levothyroxine 137 MCG tablet Commonly known as:  SYNTHROID, LEVOTHROID Take 137 mcg by mouth daily before breakfast.   lovastatin 20 MG tablet Commonly known as:  MEVACOR Take 20 mg by mouth every evening.   warfarin 10 MG tablet Commonly known as:  COUMADIN Take 10 mg by mouth daily.        DISCHARGE INSTRUCTIONS:     If you experience worsening of your admission symptoms, develop shortness of breath, life threatening emergency, suicidal or homicidal thoughts you must seek medical attention immediately by calling 911 or calling your  MD immediately  if symptoms less severe.  You Must read complete instructions/literature along with all the possible adverse reactions/side effects for all the Medicines you take and that have been prescribed to you. Take any new Medicines after you have completely understood and accept all the possible adverse reactions/side effects.   Please note  You were cared for by a hospitalist during your hospital stay. If you have any questions about your discharge medications or the care you received while you were in the hospital after you are discharged, you can call the unit and asked to speak with the hospitalist on call if the hospitalist that took care of you is not available. Once you are discharged, your primary care physician will handle any further medical issues. Please note that NO REFILLS for any discharge medications will be authorized once you are discharged, as it is imperative that you return to your primary care physician (or establish a relationship with a primary care physician if you do not have one) for your aftercare needs so that they can reassess your need for medications and monitor your lab values.    Today   CHIEF COMPLAINT:   Chief Complaint  Patient presents with  . Leg Swelling    HISTORY OF PRESENT ILLNESS:  67 y.o. female who presents with chief complaint as above.  Patient presents to the ED with complaint of right lower extremity erythema, warmth, tenderness for the past couple of days.  She states that she always has significant swelling in that leg, though it has potentially been worse recently.  She is unable to elucidate a clear timeframe.  However, couple of days ago it became tender and erythematous.  Here in the ED tonight she appears to have some cellulitis in that leg.  She has skin cracking due to significant edema.  She also had some pulmonary edema on chest x-ray and a mildly elevated BNP, raising concern for the possibility of heart failure.  Hospitalist  were called for treatment of cellulitis and further evaluation of heart failure.    VITAL SIGNS:  Blood pressure (!) 108/55, pulse 90, temperature 99.5 F (37.5 C), temperature source Oral, resp. rate 18, height 5\' 6"  (1.676 m), weight (!) 181.1 kg, SpO2 97 %.  I/O:    Intake/Output Summary (Last 24 hours) at 02/09/2018 1036 Last data filed at 02/09/2018 0700 Gross per 24 hour  Intake 1260.76 ml  Output 1150 ml  Net 110.76 ml    PHYSICAL EXAMINATION:  GENERAL:  67 y.o.-year-old patient lying in the bed with no acute distress.  EYES: Pupils equal, round, reactive to light and accommodation. No scleral icterus. Extraocular muscles intact.  HEENT: Head atraumatic, normocephalic. Oropharynx and nasopharynx clear.  NECK:  Supple, no jugular venous distention. No thyroid enlargement, no tenderness.  LUNGS: Normal breath sounds bilaterally, no wheezing, rales,rhonchi or crepitation. No use of accessory muscles of respiration.  CARDIOVASCULAR: S1, S2 normal. No murmurs, rubs, or gallops.  ABDOMEN: Soft, non-tender, non-distended. Bowel sounds present. No organomegaly or mass.  EXTREMITIES: No pedal edema, cyanosis, or clubbing.  NEUROLOGIC: Cranial nerves II through XII are intact. Muscle strength 5/5 in all extremities. Sensation intact. Gait not checked.  PSYCHIATRIC:  The patient is alert and oriented x 3.  SKIN: No obvious rash, lesion, or ulcer.   DATA REVIEW:   CBC Recent Labs  Lab 02/09/18 0528  WBC 7.1  HGB 11.0*  HCT 34.7*  PLT 234    Chemistries  Recent Labs  Lab 02/07/18 2259 02/08/18 0620 02/09/18 0528  NA 136 135 136  K 3.7 3.1* 3.8  CL 103 102 103  CO2 27 26 26   GLUCOSE 137* 156* 159*  BUN 12 12 11   CREATININE 0.94 0.90 0.86  CALCIUM 8.5* 8.0* 7.9*  MG  --  1.7  --   AST 20  --   --   ALT 11  --   --   ALKPHOS 41  --   --   BILITOT 1.2  --   --     Cardiac Enzymes Recent Labs  Lab 02/07/18 2259  TROPONINI <0.03    Microbiology Results  No  results found for this or any previous visit.  RADIOLOGY:  Dg Chest 2 View  Result Date: 02/07/2018 CLINICAL DATA:  Shortness of breath and leg swelling EXAM: CHEST - 2 VIEW COMPARISON:  07/25/2006 FINDINGS: Cardiac shadow is within normal limits. Aortic calcifications are seen. The lungs are well aerated bilaterally with slight interstitial markings on the right which may represent some asymmetric edema. No focal infiltrate or effusion is seen. No bony abnormality is noted. IMPRESSION: Changes most consistent with asymmetric edema within the right lung. Electronically Signed   By: Alcide Clever M.D.   On: 02/07/2018 23:35   US Venous Img Lower Right (dvt Study)  Result Date: 02/08/2018 CLINICAL DATA:  Right leg pain and swelling for several days EXAM: RIGHT LOWER EXTREMITY VENOUS DOPPLER ULTRASOUND TECHNIQUE: Gray-scale sonography with graded compression, as well as color Doppler and duplex ultrasound were performed to evaluate the lower extremity deep venous systems from the level of the common femoral vein and including the common femoral, femoral, profunda femoral, popliteal and calf veins including the posterior tibial, peroneal and gastrocnemius veins when visible. The superficial great saphenous vein was also interrogated. Spectral Doppler was utilized to evaluate flow at rest and with distal augmentation maneuvers in the common femoral, femoral and popliteal veins. COMPARISON:  None. FINDINGS: Contralateral Common Femoral Vein: Unable to be observed due to overlying soft tissue Common Femoral Vein: No evidence of thrombus. Normal compressibility, respiratory phasicity and response to augmentation. Saphenofemoral Junction: No evidence of thrombus. Normal compressibility and flow on color Doppler imaging. Profunda Femoral Vein: No evidence of thrombus. Normal compressibility and flow on color Doppler imaging. Femoral Vein: No evidence of thrombus. Normal compressibility, respiratory phasicity and  response to augmentation. Popliteal Vein: No evidence of thrombus. Normal compressibility, respiratory phasicity and response to augmentation. Calf Veins: Not well visualized. Superficial Great Saphenous Vein: No evidence of thrombus. Normal compressibility. Venous Reflux:  None. Other Findings:  None. IMPRESSION: Somewhat limited exam although no deep venous thrombosis is noted. Electronically Signed   By: Alcide Clever M.D.   On: 02/08/2018 02:11    EKG:   Orders placed or performed during the hospital encounter of 02/07/18  . EKG 12-Lead  . EKG 12-Lead  . EKG 12-Lead  . EKG 12-Lead      Management plans discussed with the patient, family and they are in agreement.  CODE STATUS:     Code Status Orders  (From admission, onward)         Start     Ordered   02/08/18 0217  Full code  Continuous     02/08/18 0216        Code Status History    This patient has a current code status but no historical code status.      TOTAL TIME TAKING CARE OF THIS PATIENT: 40 minutes.    Evelena Asa Salary M.D on 02/09/2018 at 10:36 AM  Between 7am to 6pm - Pager - [phone]  After 6pm go to [email or web address] - password [email or web address]  [facility]  Office  [phone]  CC: Primary care physician; Leanna Sato, MD   Note: This dictation was prepared with Dragon dictation along with smaller phrase technology. Any transcriptional errors that result from this process are unintentional.

## 2018-02-09 NOTE — Care Management Note (Signed)
Case Management Note  Patient Details  Name: Kayla Reyes MRN: [id number] Date of Birth: 1950-09-21  Subjective/Objective:  Patient continues to refuse home health. Patient is wheelchair bound but has consistently told RNCM team that she has many supports and voices no safety concerns. Patient prefers to use EMS for transportation and she is without her wheelchair and needs assistance getting into the home. Medical necessity completed. Primary RN aware of disposition. No further needs                  Action/Plan:   Expected Discharge Date:  02/09/18               Expected Discharge Plan:  Home/Self Care  In-House Referral:     Discharge planning Services  CM Consult  Post Acute Care Choice:  Home Health Choice offered to:     DME Arranged:    DME Agency:     HH Arranged:  Patient Refused HH HH Agency:     Status of Service:  Completed, signed off  If discussed at [employer], dates discussed:    Additional Comments:  Virgel Manifold, RN 02/09/2018, 11:40 AM

## 2018-02-09 NOTE — Progress Notes (Addendum)
ANTICOAGULATION CONSULT NOTE - Follow-Up Consult  Pharmacy Consult for warfarin dosing Indication: h/o arterial thrombosis  Not on File  Patient Measurements: Height: 5\' 6"  (167.6 cm) Weight: (!) 399 lb 3.2 oz (181.1 kg) IBW/kg (Calculated) : 59.3 Heparin Dosing Weight: n/a  Vital Signs: Temp: 99.1 F (37.3 C) (12/27 0559) BP: 107/63 (12/27 0602) Pulse Rate: 87 (12/27 0602)  Labs: Recent Labs    02/07/18 2259 02/08/18 0620 02/09/18 0528  HGB 12.4 10.5* 11.0*  HCT 38.9 33.4* 34.7*  PLT 269 232 234  LABPROT 25.1*  --  27.9*  INR 2.31  --  2.65  CREATININE 0.94 0.90 0.86  TROPONINI <0.03  --   --     Estimated Creatinine Clearance: 111.2 mL/min (by C-G formula based on SCr of 0.86 mg/dL).   Medical History: Past Medical History:  Diagnosis Date  . Arterial thrombosis (HCC)    a. s/p thromboectomy and thrombolysis with left BKA in 2008 on chronic Coumadin   . HLD (hyperlipidemia)   . HLD (hyperlipidemia)   . Hypertension   . Hypothyroidism   . Obesity   . Rheumatoid arthritis (HCC)     Medications:  Home dose 10 mg daily  Assessment: 67 yo female admitted for leg swelling and heart failure. Found to have cellulitis of right lower extremity. Patient had no prior cardiac history. On warfarin outpatient for arterial thrombus with left BKA in 2008. Has been therapeutic outpatient. Patient is on ceftriaxone + Vancomycin for cellulitis. Ceftriaxone can increase warfarin effects, will monitor.  Patient did have afib with RVR on 12/26 was started on diltiazem drip, which has since been discontinued, but was on amiodarone drip that ran for ~11 hours. Will decrease warfarin dose tonight by 25% giving a 7.5 mg dose to prevent supratherapeutic INR. Will follow-up.   INR therapeutic at 2.65.  Goal of Therapy:  INR 2-3    Plan:  Decrease warfarin dose to 7.5 mg PO at 1800 as above. Daily INR/CBC while on antibiotics   1/27, PharmD Pharmacy Resident   02/09/2018 7:15 AM

## 2018-02-09 NOTE — Progress Notes (Signed)
Kayla Reyes to be D/C'd Home per MD order.  Discussed prescriptions and follow up appointments with the patient. Prescriptions given to patient, medication list explained in detail. Pt verbalized understanding.  Allergies as of 02/09/2018   Not on File     Medication List    STOP taking these medications   atenolol-chlorthalidone 50-25 MG tablet Commonly known as:  TENORETIC     TAKE these medications   albuterol 108 (90 Base) MCG/ACT inhaler Commonly known as:  PROVENTIL HFA;VENTOLIN HFA Inhale 2 puffs into the lungs every 6 (six) hours as needed for wheezing or shortness of breath.   atenolol 25 MG tablet Commonly known as:  TENORMIN Take 3 tablets (75 mg total) by mouth daily.   cefdinir 300 MG capsule Commonly known as:  OMNICEF Take 1 capsule (300 mg total) by mouth 2 (two) times daily.   doxycycline 100 MG capsule Commonly known as:  VIBRAMYCIN Take 1 capsule (100 mg total) by mouth 2 (two) times daily.   FLONASE 50 MCG/ACT nasal spray Generic drug:  fluticasone Place 1 spray into both nostrils daily as needed for allergies or rhinitis.   furosemide 20 MG tablet Commonly known as:  LASIX Take 1 tablet (20 mg total) by mouth daily.   gabapentin 600 MG tablet Commonly known as:  NEURONTIN Take 600 mg by mouth 3 (three) times daily.   levothyroxine 137 MCG tablet Commonly known as:  SYNTHROID, LEVOTHROID Take 137 mcg by mouth daily before breakfast.   lovastatin 20 MG tablet Commonly known as:  MEVACOR Take 20 mg by mouth every evening.   warfarin 10 MG tablet Commonly known as:  COUMADIN Take 10 mg by mouth daily.       Vitals:   02/09/18 1103 02/09/18 1517  BP: 102/65 112/69  Pulse: 81 84  Resp:  18  Temp:  98.5 F (36.9 C)  SpO2:  99%    Tele box removed and returned. Skin clean, dry and intact without evidence of skin break down, no evidence of skin tears noted. IV catheter discontinued intact. Site without signs and symptoms of  complications. Dressing and pressure applied. Pt denies pain at this time. No complaints noted.  An After Visit Summary was printed and given to the patient. Patient escorted via WC, and D/C home via private auto.  Rigoberto Noel

## 2018-02-12 ENCOUNTER — Telehealth: Payer: Self-pay

## 2018-02-12 NOTE — Telephone Encounter (Signed)
Flagged on EMMI report for not knowing who to call regarding changes in condition.  Called and spoke with patient.  She is aware to follow up with Dr. Marvis Moeller, her PCP or Eula Listen with [facility].  She has already reached out to Dr. Marvis Moeller' office this morning with a question regarding her warfarin and is waiting to hear back.  No other questions or concerns at [facility] time.  I thanked her for her time and informed her she would receive one more automated call checking in over the next few days.

## 2018-02-16 ENCOUNTER — Encounter: Payer: Self-pay | Admitting: Physician Assistant

## 2018-02-16 NOTE — Progress Notes (Signed)
Cardiology Office Note Date:  02/19/2018  Patient ID:  Kayla Reyes, DOB 06/05/1950, MRN [id number] PCP:  Leanna SatoMiles, Linda M, MD  Cardiologist:  Dr. Kirke CorinArida, MD    Chief Complaint: Hospital follow up  History of Present Illness: Kayla Reyes is a 68 y.o. 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   Prior to the above admission, she did not have any previously known cardiac history. She was admitted to [facility] in 01/2018 with increased swelling of the right leg and SOB for the prior 1-2 weeks. Lower extremity ultrasound was negative for DVT. CXR showed slightly increased interstitial markings. Cardiac enzymes were negative. BNP 231. INR was therapeutic at 2.31. She was gently diuresed by IM for possible mild diastolic CHF. Echo showed an EF of 55-60%, normal wall motion, normal LV diastolic function, unable to estimate PASP, IVC normal in size, no significant valvular abnormalities. Her lower extremity swelling was felt to be mostly secondary to venous insufficiency in the setting of morbid obesity. She did develop new onset Afib in the setting of hypokalemia prior to discharge and converted to sinus rhythm with IV amiodarone. INR remained therapeutic during her admission with discharge INR of 2.65. It was recommended she be placed on metoprolol by cardiology.  However, upon patient being discharged she was continued on atenolol.     Labs: 01/2018 - INR 2.65, K+ 3.8, SCr 0.86, WBC 7.1, HGB 11.0, PLT 234, TSH normal, Mg++ 1.7, A1c 6.1  She comes  in doing well from a cardiac perspective.  She did note on the day of discharge, 12/20 7 in the evening hours she had several episodes of tachypalpitations that spontaneously resolved each time.  Since then, she has not had any symptoms concerning for A. fib.  No chest pain, increased shortness of breath, dizziness, presyncope, or syncope.  Her right lower extremity swelling remains stable.  She has a stable, 3 pillow orthopnea.  No early satiety.  She is unable to weigh herself at home secondary to her prior left-sided BKA.  Last INR check was 02/14/2018 at home with a patient reported value of 3.0.  Her INRs are followed by her PCP and have been stable.  No falls, BRBPR or melena.  She is dealing with a URI with significant nasal congestion, sinus pressure, postnasal drip, cough, and wheeze.  She has been using her albuterol inhaler as needed though has typically only been using it once daily if at all.   Past Medical History:  Diagnosis Date  . Arterial thrombosis (HCC)    a. s/p thromboectomy and thrombolysis with left BKA in 2008 on chronic Coumadin   . HLD (hyperlipidemia)   . Hypertension   . Hypothyroidism   . Obesity   . PAF (paroxysmal atrial fibrillation) (HCC)    a. noted 12/19 in the setting of hypokalemia; b. CHADS2VASc 4 (HTN, age x 1, vascular disease, female); c. on chronic Coumadin in the setting of prior arterial clot  . Rheumatoid arthritis [facility](HCC)     Past Surgical History:  Procedure Laterality Date  . BELOW KNEE LEG  AMPUTATION      Current Meds  Medication Sig  . albuterol (PROVENTIL HFA;VENTOLIN HFA) 108 (90 Base) MCG/ACT inhaler Inhale 2 puffs into the lungs every 6 (six) hours as needed for wheezing or shortness of breath.  Marland Kitchen atenolol (TENORMIN) 25 MG tablet Take 3 tablets (75 mg total) by mouth daily.  . fluticasone (FLONASE) 50 MCG/ACT nasal spray Place 1 spray into both nostrils daily as needed for allergies or rhinitis.  . furosemide (LASIX) 20 MG tablet Take 1  tablet (20 mg total) by mouth daily.  Marland Kitchen gabapentin (NEURONTIN) 600 MG tablet Take 600 mg by mouth 3 (three) times daily.  Marland Kitchen levothyroxine (SYNTHROID, LEVOTHROID) 137 MCG tablet Take 137 mcg by mouth daily before breakfast.  . lovastatin (MEVACOR) 20 MG tablet Take 20 mg by mouth every evening.  . warfarin (COUMADIN) 10 MG tablet Take 10 mg by mouth daily.    Allergies:   Patient has no allergy information on record.   Social History:  The patient  reports that she has quit smoking. She has never used smokeless tobacco. She reports previous alcohol use. She reports previous drug use.   Family History:  The patient's family history includes Arthritis in her mother; Cancer in her father; Hypertension in her mother; Thyroid disease in her father.  ROS:   Review of Systems  Constitutional: Positive for malaise/fatigue. Negative for chills, diaphoresis, fever and weight loss.  HENT: Positive for congestion, sinus pain and sore throat.        Postnasal drip  Eyes: Negative for discharge and redness.  Respiratory: Positive for cough and wheezing. Negative for hemoptysis, sputum production and shortness of breath.   Cardiovascular: Positive for leg swelling. Negative for chest pain, palpitations, orthopnea, claudication and PND.  Gastrointestinal: Negative for abdominal pain, blood in stool, heartburn, melena, nausea and vomiting.  Genitourinary: Negative for hematuria.  Musculoskeletal: Negative for falls and myalgias.  Skin: Negative for rash.  Neurological: Positive for weakness. Negative for dizziness, tingling, tremors, sensory change, speech change, focal weakness and loss of consciousness.  Endo/Heme/Allergies: Does not bruise/bleed easily.  Psychiatric/Behavioral: Negative for substance abuse. The patient is not nervous/anxious.   All other systems reviewed and are negative.    PHYSICAL EXAM:  VS:  BP 132/70 (BP Location: Right Arm, Patient Position: Sitting, Cuff Size: Large)   Pulse  68   Ht 5\' 6"  (1.676 m)   Wt (!) 399 lb (181 kg)   BMI 64.40 kg/m  BMI: Body mass index is 64.4 kg/m.  Physical Exam  Constitutional: She is oriented to person, place, and time. She appears well-developed and well-nourished.  HENT:  Head: Normocephalic and atraumatic.  Eyes: Right eye exhibits no discharge. Left eye exhibits no discharge.  Neck: Normal range of motion. No JVD present.  Cardiovascular: Normal rate, regular rhythm, S1 normal, S2 normal and normal heart sounds. Exam reveals no distant heart sounds, no friction rub, no midsystolic click and no opening snap.  No murmur heard. Pulmonary/Chest: Effort normal. No respiratory distress. She has no decreased breath sounds. She has wheezes in the right lower field and the left lower field. She has no rales. She exhibits no tenderness.  Abdominal: Soft. She exhibits no distension. There is no abdominal tenderness.  Musculoskeletal:        General: Edema present.     Comments: 1+ right lower extremity swelling with chronic woody edema appearance.  Status post left-sided BKA.  Neurological: She is alert and oriented to person, place, and time.  Skin: Skin is warm and dry. No cyanosis. Nails show no clubbing.  Psychiatric: She has a normal mood and affect. Her speech is normal and behavior is normal. Judgment and thought content normal.     EKG:  Was ordered and interpreted by me today. Shows NSR, 68 bpm, no acute st/t changes   Recent Labs: 02/07/2018: ALT 11; B Natriuretic Peptide 231.0 02/08/2018: Magnesium 1.7; TSH 1.450 02/09/2018: BUN 11; Creatinine, Ser 0.86; Hemoglobin 11.0; Platelets 234; Potassium 3.8; Sodium 136  No results found for requested labs within last 8760 hours.   Estimated Creatinine Clearance: 111.2 mL/min (by C-G formula based on SCr of 0.86 mg/dL).   Wt Readings from Last 3 Encounters:  02/19/18 (!) 399 lb (181 kg)  02/09/18 (!) 399 lb 3.2 oz (181.1 kg)     Other studies reviewed: Additional  studies/records reviewed today include: summarized above  ASSESSMENT AND PLAN:  1. PAF: Maintaining sinus rhythm.  Given the patient's active wheezing in the setting of URI, we we will discontinue her atenolol and place her on Cardizem CD 120 mg daily.  This will assist in some of her upper respiratory issues in the setting of her URI.  However, I do have concerns that long-term calcium channel blocker usage in her will certainly exacerbate her right lower extremity swelling given she has underlying dependent edema with venous insufficiency.  In this setting, I would recommend to only use calcium channel blocker for short-term and once her wheezing has improved with transition her to metoprolol succinate.  Given her CHADS2VASc of at least 4 (HTN, age x 1, vascular disease, female) she will remain on Coumadin, which she was previously on secondary to arterial thrombus.    2. HFpEF: She appears well compensated and is euvolemic on exam.  Right lower extremity swelling is not related to heart failure and more related to dependent edema from chronic venous insufficiency in the setting of morbid obesity.  Recommend compression stockings and leg elevation.  Can use low-dose diuretic sparingly for exacerbations.  Should she redevelop A. fib with RVR for sustained time span, she would be at increased risk for diastolic CHF exacerbation.  3. Venous insufficiency: Leg elevation and compression stocking recommended.  Lasix sparingly.  4. HTN: Blood pressure reasonably controlled today.  Transition from atenolol to Cardizem as outlined above.  Remains on Lasix 20 mg daily.  Check BMP.  5. HLD: Continue statin.  Managed by PCP.  6. History of arterial thrombus status post left BKA: On chronic Coumadin.  Managed by PCP.  7. Morbid obesity: Weight loss advised.  8. URI/wheezing: Advised patient to follow-up with PCP this week.  She will use her albuterol inhaler every 4 to 6 hours.  We have discontinued her  beta-blocker as outlined above given ongoing wheezing.  We will not transition her from albuterol to Xopenex given cost concerns.  Disposition: F/u with Dr. Kirke CorinArida or an APP in 3 months.  Current medicines are reviewed at length with the patient today.  The patient did not have any concerns regarding medicines.  Signed, Eula Listenyan Dunn, PA-C 02/19/2018 1:34 PM     [facility] [place] [address] [place], [place] [address] [phone]

## 2018-02-19 ENCOUNTER — Encounter: Payer: Self-pay | Admitting: Physician Assistant

## 2018-02-19 ENCOUNTER — Ambulatory Visit (INDEPENDENT_AMBULATORY_CARE_PROVIDER_SITE_OTHER): Payer: Medicare Other | Admitting: Physician Assistant

## 2018-02-19 VITALS — BP 132/70 | HR 68 | Ht 66.0 in | Wt 399.0 lb

## 2018-02-19 DIAGNOSIS — I5032 Chronic diastolic (congestive) heart failure: Secondary | ICD-10-CM | POA: Diagnosis not present

## 2018-02-19 DIAGNOSIS — I10 Essential (primary) hypertension: Secondary | ICD-10-CM | POA: Diagnosis not present

## 2018-02-19 DIAGNOSIS — E785 Hyperlipidemia, unspecified: Secondary | ICD-10-CM | POA: Diagnosis not present

## 2018-02-19 DIAGNOSIS — I480 Paroxysmal atrial fibrillation: Secondary | ICD-10-CM

## 2018-02-19 DIAGNOSIS — I872 Venous insufficiency (chronic) (peripheral): Secondary | ICD-10-CM

## 2018-02-19 MED ORDER — DILTIAZEM HCL ER COATED BEADS 120 MG PO CP24: 120.0000 mg | ORAL_CAPSULE | Freq: Every day | ORAL | 3 refills | Status: DC

## 2018-02-19 NOTE — Patient Instructions (Signed)
Medication Instructions:  Your physician has recommended you make the following change in your medication:  1- STOP Atenolol 2- START Cardizem Take 1 capsule (120 mg total) by mouth daily. If you need a refill on your cardiac medications before your next appointment, please call your pharmacy.   Lab work: Your physician recommends that you return for lab work today (CBC, BMET)  If you have labs (blood work) drawn today and your tests are completely normal, you will receive your results only by: Marland Kitchen MyChart Message (if you have MyChart) OR . A paper copy in the mail If you have any lab test that is abnormal or we need to change your treatment, we will call you to review the results.  Testing/Procedures: None ordered   Follow-Up: At [facility], you and your health needs are our priority.  As part of our continuing mission to provide you with exceptional heart care, we have created designated Provider Care Teams.  These Care Teams include your primary Cardiologist (physician) and Advanced Practice Providers (APPs -  Physician Assistants and Nurse Practitioners) who all work together to provide you with the care you need, when you need it. You will need a follow up appointment in 3 months. You may see Dr. Kirke Corin or one of the following Advanced Practice Providers on your designated Care Team:   Nicolasa Ducking, NP Eula Listen, PA-C . Marisue Ivan, PA-C

## 2018-02-20 ENCOUNTER — Telehealth: Payer: Self-pay

## 2018-02-20 LAB — COMPREHENSIVE METABOLIC PANEL
ALT: 6 IU/L (ref 0–32)
AST: 16 IU/L (ref 0–40)
Albumin/Globulin Ratio: 0.7 — ABNORMAL LOW (ref 1.2–2.2)
Albumin: 3.1 g/dL — ABNORMAL LOW (ref 3.6–4.8)
Alkaline Phosphatase: 62 IU/L (ref 39–117)
BUN/Creatinine Ratio: 7 — ABNORMAL LOW (ref 12–28)
BUN: 7 mg/dL — ABNORMAL LOW (ref 8–27)
Bilirubin Total: 0.3 mg/dL (ref 0.0–1.2)
CO2: 24 mmol/L (ref 20–29)
Calcium: 8.8 mg/dL (ref 8.7–10.3)
Chloride: 99 mmol/L (ref 96–106)
Creatinine, Ser: 0.95 mg/dL (ref 0.57–1.00)
GFR calc Af Amer: 73 mL/min/{1.73_m2} (ref >59)
GFR calc non Af Amer: 63 mL/min/{1.73_m2} (ref >59)
Globulin, Total: 4.2 g/dL (ref 1.5–4.5)
Glucose: 103 mg/dL — ABNORMAL HIGH (ref 65–99)
Potassium: 4.6 mmol/L (ref 3.5–5.2)
Sodium: 137 mmol/L (ref 134–144)
TOTAL PROTEIN: 7.3 g/dL (ref 6.0–8.5)

## 2018-02-20 LAB — CBC
Hematocrit: 38.1 % (ref 34.0–46.6)
Hemoglobin: 12.6 g/dL (ref 11.1–15.9)
MCH: 27 pg (ref 26.6–33.0)
MCHC: 33.1 g/dL (ref 31.5–35.7)
MCV: 82 fL (ref 79–97)
PLATELETS: 401 10*3/uL (ref 150–450)
RBC: 4.66 x10E6/uL (ref 3.77–5.28)
RDW: 15.3 % (ref 11.7–15.4)
WBC: 5.8 10*3/uL (ref 3.4–10.8)

## 2018-02-20 NOTE — Telephone Encounter (Signed)
Call attempted, LMTCB 

## 2018-02-20 NOTE — Telephone Encounter (Signed)
Results called to pt. Pt verbalized understanding.  

## 2018-02-20 NOTE — Telephone Encounter (Signed)
Patient returning call.

## 2018-02-20 NOTE — Telephone Encounter (Signed)
-----   Message from Sondra Bargesyan M Dunn, PA-C sent at 02/20/2018  7:46 AM EST ----- Blood count normal.  Renal function normal.  Potassium at goal.  Albumin is low, which is likely contributing to some of her edema.

## 2019-03-14 NOTE — Progress Notes (Deleted)
Cardiology Office Note    Date:  03/14/2019   ID:  Kayla Reyes, DOB 04-04-1950, MRN [id number]  PCP:  Leanna Sato, MD  Cardiologist:  Lorine Bears, MD  Electrophysiologist:  None   Chief Complaint: ***  History of Present Illness:   Kayla Reyes is a 69 y.o. female with history of ***  ***   Labs independently reviewed: ***  Past Medical History:  Diagnosis Date  . Arterial thrombosis (HCC)    a. s/p thromboectomy and thrombolysis with left BKA in 2008 on chronic Coumadin   . HLD (hyperlipidemia)   . Hypertension   . Hypothyroidism   . Obesity   . PAF (paroxysmal atrial fibrillation) (HCC)    a. noted 12/19 in the setting of hypokalemia; b. CHADS2VASc 4 (HTN, age x 1, vascular disease, female); c. on chronic Coumadin in the setting of prior arterial clot  . Rheumatoid arthritis [facility])     Past Surgical History:  Procedure Laterality Date  . BELOW KNEE LEG AMPUTATION      Current Medications: No outpatient medications have been marked as taking for the 03/20/19 encounter (Appointment) with Sondra Barges, PA-C.    Allergies:   Patient has no allergy information on record.   Social History   Socioeconomic History  . Marital status: Single    Spouse name: Not on file  . Number of children: Not on file  . Years of education: Not on file  . Highest education level: Not on file  Occupational History  . Not on file  Tobacco Use  . Smoking status: Former Games developer  . Smokeless tobacco: Never Used  Substance and Sexual Activity  . Alcohol use: Not Currently  . Drug use: Not Currently  . Sexual activity: Not on file  Other Topics Concern  . Not on file  Social History Narrative  . Not on file   Social Determinants of Health   Financial Resource Strain:   . Difficulty of Paying Living Expenses: Not on file  Food Insecurity:   . Worried About Programme researcher, broadcasting/film/video in the Last Year: Not on file  . Ran Out of Food in the Last Year: Not on file    Transportation Needs:   . Lack of Transportation (Medical): Not on file  . Lack of Transportation (Non-Medical): Not on file  Physical Activity:   . Days of Exercise per Week: Not on file  . Minutes of Exercise per Session: Not on file  Stress:   . Feeling of Stress : Not on file  Social Connections:   . Frequency of Communication with Friends and Family: Not on file  . Frequency of Social Gatherings with Friends and Family: Not on file  . Attends Religious Services: Not on file  . Active Member of Clubs or Organizations: Not on file  . Attends Banker Meetings: Not on file  . Marital Status: Not on file     Family History:  The patient's family history includes Arthritis in her mother; Cancer in her father; Hypertension in her mother; Thyroid disease in her father.  ROS:   ROS   EKGs/Labs/Other Studies Reviewed:    Studies reviewed were summarized above. The additional studies were reviewed today: ***  EKG:  EKG is ordered today.  The EKG ordered today demonstrates ***  Recent Labs: No results found for requested labs within last 8760 hours.  Recent Lipid Panel No results found for: CHOL, TRIG, HDL, CHOLHDL, VLDL, LDLCALC, LDLDIRECT  PHYSICAL  EXAM:    VS:  There were no vitals taken for this visit.  BMI: There is no height or weight on file to calculate BMI.  Physical Exam  Wt Readings from Last 3 Encounters:  02/19/18 (!) 399 lb (181 kg)  02/09/18 (!) 399 lb 3.2 oz (181.1 kg)     ASSESSMENT & PLAN:   1. ***  Disposition: F/u with Dr. Fletcher Anon or an APP in ***.   Medication Adjustments/Labs and Tests Ordered: Current medicines are reviewed at length with the patient today.  Concerns regarding medicines are outlined above. Medication changes, Labs and Tests ordered today are summarized above and listed in the Patient Instructions accessible in Encounters.   Signed, Christell Faith, PA-C 03/14/2019 4:37 PM     [address], [place] [address] [phone]

## 2019-03-15 ENCOUNTER — Telehealth: Payer: Self-pay | Admitting: Physician Assistant

## 2019-03-15 NOTE — Telephone Encounter (Signed)
Patient calling  States that she has had a cough  Would like to know if it would be ok to come to appointment on Wed Please advise

## 2019-03-15 NOTE — Telephone Encounter (Signed)
I don't know if she would pass the screening coming in.  We have a couple of options:  1) if she is doing well and stable from a cardiac perspective we could postpone her visit for a couple of weeks until she is feeling better  2) we could transition her to a virtual visit  3) if she is having acute issues from a cardiac perspective that she feels like need to be evaluated in person we could follow current recommendations, which I believe are to have her be the last patient for the day with all staff wearing appropriate PPE

## 2019-03-15 NOTE — Telephone Encounter (Signed)
I spoke with the patient. She states she was diagnosed with a sinus infection last month and has had a cough since then. Her PCP has advised her to take mucinex. She is not on any antibiotics and not running a fever.   She is due for a follow up appointment on 03/20/19 with Eula Listen, PA. Will forward to provider to review. The patient is aware we will call her back and voices understanding.

## 2019-03-15 NOTE — Telephone Encounter (Signed)
I called and spoke with the patient regarding Ryan's recommendations.  The patient states she his stable from a Cardiac perspective.   She would like to r/s her follow up a few weeks out. I have advised her I will cancel her appt for 2/3 with Eula Listen, PA and send a message to scheduling to please call her to arrange a follow up in a few weeks.  The patient is agreeable and voices understanding.  Staff message sent to scheduling to please call the patient.

## 2019-03-20 ENCOUNTER — Ambulatory Visit: Payer: Medicare Other | Admitting: Physician Assistant

## 2019-04-01 NOTE — Progress Notes (Signed)
Office Visit    Patient Name: Kayla Reyes Date of Encounter: 04/10/2019  Primary Care Provider:  Leanna Sato, MD Primary Cardiologist:  Lorine Bears, MD Electrophysiologist:  None   Chief Complaint    Kayla Reyes is a 69 y.o. 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    Past Medical History    Past Medical History:  Diagnosis Date  . Arterial thrombosis (HCC)    a. s/p thromboectomy and thrombolysis with left BKA in 2008 on chronic Coumadin   . HLD (hyperlipidemia)   . Hypertension   . Hypothyroidism   . Obesity   . PAF (paroxysmal atrial fibrillation) (HCC)    a. noted 12/19 in the setting of hypokalemia; b. CHADS2VASc 4 (HTN, age x 1, vascular disease, female); c. on chronic Coumadin in the setting of prior arterial clot  . Rheumatoid arthritis [facility])    Past Surgical History:  Procedure Laterality Date  . BELOW KNEE LEG AMPUTATION      Allergies  No Known Allergies  History of Present Illness    Kayla Reyes is a 69 y.o. 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 She was last seen 02/2018 by Eula Listen, PA.  Admitted 01/2018 increased RLE swelling and SOB. LE ultrasound negative for DVT. CXR with slightly increased interstitial markings. Cardiac enzymes negative, BNP 231, INR therapeutic at 2.31. Echo EF 55-60^, normal wall motion, normal LV diastolic function, unable to estimate PASP, IVC normal size, no significant valvular abnormalities. LE edema presumed due to venous insufficiency. New onset atrial fib in hospital in setting of hypokalemia, converted to SR with IV  amiodarone.   She follows with rheumatology for rheymatoid arthritis.  Labs via Care Everywhere 04/09/19:  Hb 13.1, Creatinine 0.7, GFR 101, AST 13, ALT 8  She will notice intermittent palpitations maybe once per month that will go away on their own lasting only a couple minutes. She reports they are not bothersome. Not associated with pain or shortness of breath. We discussed triggers of palpitations - does not drink/smoke, minimal caffeine intake, no prescription nor OTC palpitations. We discussed atrial fibrillation and she was reassured that her risk of stroke is reduced by her Warfarin.   Reports no chest pain, pressure, tightness. No SOB nor DOE. Reports her RLE edema is stable at baseline. She does try to sit with her leg up. Does not wear compression stockings.   Reports her rheumatoid arthritis bothers her. Very limited exercise capacity due to this and her L BKA. No formal exercise regimen. Tells me she sometimes is frustrated by things she cannot do for herself.  EKGs/Labs/Other Studies Reviewed:   The following studies were reviewed today:  EKG:  EKG is ordered today.  The ekg ordered today demonstrates SR 68 bpm with poor R wave progression.   Recent Labs: No results found for requested labs within last 8760 hours.  Recent Lipid Panel No results found for: CHOL, TRIG, HDL, CHOLHDL, VLDL, LDLCALC, LDLDIRECT  Home Medications   Current Meds  Medication Sig  . albuterol (PROVENTIL HFA;VENTOLIN HFA) 108 (90 Base) MCG/ACT inhaler Inhale 2 puffs into the lungs every 6 (  six) hours as needed for wheezing or shortness of breath.  . fluticasone (FLONASE) 50 MCG/ACT nasal spray Place 1 spray into both nostrils daily as needed for allergies or rhinitis.  . folic acid (FOLVITE) 1 MG tablet Take 1 mg by mouth daily.   . furosemide (LASIX) 20 MG tablet Take 1 tablet (20 mg total) by mouth daily.  Marland Kitchen gabapentin (NEURONTIN) 600 MG tablet Take 600 mg by mouth 3 (three) times daily.  .  hydroxychloroquine (PLAQUENIL) 200 MG tablet TAKE 2 TABLETS(400 MG) BY MOUTH EVERY DAY  . levothyroxine (SYNTHROID, LEVOTHROID) 137 MCG tablet Take 137 mcg by mouth daily before breakfast.  . lovastatin (MEVACOR) 20 MG tablet Take 20 mg by mouth every evening.  . methotrexate (RHEUMATREX) 2.5 MG tablet Take 2.5 mg by mouth once a week.   . metoprolol tartrate (LOPRESSOR) 25 MG tablet Take 25 mg by mouth 2 (two) times daily.   . predniSONE (DELTASONE) 5 MG tablet Take 5 mg by mouth daily with breakfast.   . warfarin (COUMADIN) 10 MG tablet Take 10 mg by mouth daily.      Review of Systems      Review of Systems  Constitution: Negative for chills, fever and malaise/fatigue.  Cardiovascular: Positive for leg swelling and palpitations. Negative for chest pain, dyspnea on exertion, irregular heartbeat, near-syncope, orthopnea and syncope.  Respiratory: Negative for cough, shortness of breath and wheezing.   Gastrointestinal: Negative for melena, nausea and vomiting.  Genitourinary: Negative for hematuria.  Neurological: Negative for dizziness, light-headedness and weakness.   All other systems reviewed and are otherwise negative except as noted above.  Physical Exam    VS:  BP (!) 144/82 (BP Location: Right Arm, Patient Position: Sitting, Cuff Size: Large)   Pulse 68   Ht 5\' 7"  (1.702 m)   SpO2 98%   BMI 62.49 kg/m  , BMI Body mass index is 62.49 kg/m. GEN: Well nourished, morbidly obese, well developed, in no acute distress. HEENT: normal. Neck: Supple, no JVD, carotid bruits, or masses. Cardiac: RRR, no murmurs, rubs, or gallops. No clubbing, cyanosis.  Radials 2+ and equal bilaterally. RLE DP and PT 1+. RLE with 1+ swelling with chronic woody edema appearance.  Respiratory: Respirations regular and unlabored, clear to auscultation bilaterally. GI: Soft, nontender, nondistended, BS + x 4. MS: No deformity or atrophy. S/p L BKA.  Skin: Warm and dry, no rash. Neuro:  Strength and  sensation are intact. Psych: Normal affect.  Accessory Clinical Findings    ECG personally reviewed by me today - SR 68 bpm with poor R wave progression - no acute changes.  Assessment & Plan    1. PAF - Intermittent palpitations <1x/week that last only minutes and are not bothersome. Continue Metoprolol 25mg  BID. Would avoid CCB in setting of chronic RLE edema. EKG today SR 68 bpm. We discussed triggers to avoid such as caffeine, etoh, stress. We discussed increasing her Metoprolol and she politely declines as she feels her symptoms are well controlled. Appropriately anticoagulated on Warfarin for CHADS2VASc of at least 4 due to hx of arterial thrombus.  2. HFpEF - Well compensated and euvolemic on exam. RLE not related to HF and more related to dependent edema from venous insufficiency in setting of morbid obesity. If noted to be in recurrent atrial fibrillation, increased risk diastolic CHF exacerbation.   3. Venous insufficiency - RLE noted 1+ edema with chronic edema woody appearance. Recommend elevating lower extremity. Recommend compression stocking. Recommend low sodium diet. Continue  Lasix as prescribed by her primary care provider.   4. HTN - BP mildly elevated today but well controlled at recent office visit. Continue present antihypertensive regimen.   5. HLD - Follows with per primary care provider. Continue Lovastatin 20mg  daily.   6. Hx of arterial thrombus s/p L BKA - On chronic Coumadin. Denies bleeding complications.   7. Morbid obesity - Weight loss recommended. As she is s/p L BKA most of weight loss will likely be related to diet as her exercise abilities are limited.   Disposition: Follow up in 6 month(s) with Dr. Fletcher Anon or APP   Loel Dubonnet, NP 04/10/2019, 3:18 PM

## 2019-04-10 ENCOUNTER — Other Ambulatory Visit: Payer: Self-pay

## 2019-04-10 ENCOUNTER — Ambulatory Visit (INDEPENDENT_AMBULATORY_CARE_PROVIDER_SITE_OTHER): Payer: Medicare Other | Admitting: Family

## 2019-04-10 ENCOUNTER — Encounter: Payer: Self-pay | Admitting: Family

## 2019-04-10 VITALS — BP 144/82 | HR 68 | Ht 67.0 in

## 2019-04-10 DIAGNOSIS — I872 Venous insufficiency (chronic) (peripheral): Secondary | ICD-10-CM

## 2019-04-10 DIAGNOSIS — I5032 Chronic diastolic (congestive) heart failure: Secondary | ICD-10-CM | POA: Diagnosis not present

## 2019-04-10 DIAGNOSIS — I10 Essential (primary) hypertension: Secondary | ICD-10-CM

## 2019-04-10 DIAGNOSIS — I480 Paroxysmal atrial fibrillation: Secondary | ICD-10-CM | POA: Diagnosis not present

## 2019-04-10 DIAGNOSIS — Z7901 Long term (current) use of anticoagulants: Secondary | ICD-10-CM

## 2019-04-10 NOTE — Patient Instructions (Signed)
Medication Instructions:  No medication changes today.  *If you need a refill on your cardiac medications before your next appointment, please call your pharmacy*  Lab Work: No lab work today.  If you have labs (blood work) drawn today and your tests are completely normal, you will receive your results only by: Marland Kitchen MyChart Message (if you have MyChart) OR . A paper copy in the mail If you have any lab test that is abnormal or we need to change your treatment, we will call you to review the results.  Testing/Procedures: You had an EKG. It showed sinus rhythm which is a normal heart rhyth.  Follow-Up: At [facility], you and your health needs are our priority.  As part of our continuing mission to provide you with exceptional heart care, we have created designated Provider Care Teams.  These Care Teams include your primary Cardiologist (physician) and Advanced Practice Providers (APPs -  Physician Assistants and Nurse Practitioners) who all work together to provide you with the care you need, when you need it.  Your next appointment:   6 month(s)  The format for your next appointment:   In Person  Provider:    You may see Lorine Bears, MD or one of the following Advanced Practice Providers on your designated Care Team:    Nicolasa Ducking, NP  Eula Listen, PA-C  Marisue Ivan, PA-C   Other Instructions  Atrial Fibrillation  Atrial fibrillation is a type of heartbeat that is irregular or fast. If you have this condition, your heart beats without any order. This makes it hard for your heart to pump blood in a normal way. Atrial fibrillation may come and go, or it may become a long-lasting problem. If this condition is not treated, it can put you at higher risk for stroke, heart failure, and other heart problems. What are the causes? This condition may be caused by diseases that damage the heart. They include:  High blood pressure.  Heart failure.  Heart valve  disease.  Heart surgery. Other causes include:  Diabetes.  Thyroid disease.  Being overweight.  Kidney disease. Sometimes the cause is not known. What increases the risk? You are more likely to develop this condition if:  You are older.  You smoke.  You exercise often and very hard.  You have a family history of this condition.  You are a man.  You use drugs.  You drink a lot of alcohol.  You have lung conditions, such as emphysema, pneumonia, or COPD.  You have sleep apnea. What are the signs or symptoms? Common symptoms of this condition include:  A feeling that your heart is beating very fast.  Chest pain or discomfort.  Feeling short of breath.  Suddenly feeling light-headed or weak.  Getting tired easily during activity.  Fainting.  Sweating. In some cases, there are no symptoms. How is this treated? Treatment for this condition depends on underlying conditions and how you feel when you have atrial fibrillation. They include:  Medicines to: ? Prevent blood clots. ? Treat heart rate or heart rhythm problems.  Using devices, such as a pacemaker, to correct heart rhythm problems.  Doing surgery to remove the part of the heart that sends bad signals.  Closing an area where clots can form in the heart (left atrial appendage). In some cases, your doctor will treat other underlying conditions. Follow these instructions at home: Medicines  Take over-the-counter and prescription medicines only as told by your doctor.  Do not  take any new medicines without first talking to your doctor.  If you are taking blood thinners: ? Talk with your doctor before you take any medicines that have aspirin or NSAIDs, such as ibuprofen, in them. ? Take your medicine exactly as told by your doctor. Take it at the same time each day. ? Avoid activities that could hurt or bruise you. Follow instructions about how to prevent falls. ? Wear a bracelet that says you are  taking blood thinners. Or, carry a card that lists what medicines you take. Lifestyle      Do not use any products that have nicotine or tobacco in them. These include cigarettes, e-cigarettes, and chewing tobacco. If you need help quitting, ask your doctor.  Eat heart-healthy foods. Talk with your doctor about the right eating plan for you.  Exercise regularly as told by your doctor.  Do not drink alcohol.  Lose weight if you are overweight.  Do not use drugs, including cannabis. General instructions  If you have a condition that causes breathing to stop for a short period of time (apnea), treat it as told by your doctor.  Keep a healthy weight. Do not use diet pills unless your doctor says they are safe for you. Diet pills may make heart problems worse.  Keep all follow-up visits as told by your doctor. This is important. Contact a doctor if:  You notice a change in the speed, rhythm, or strength of your heartbeat.  You are taking a blood-thinning medicine and you get more bruising.  You get tired more easily when you move or exercise.  You have a sudden change in weight. Get help right away if:   You have pain in your chest or your belly (abdomen).  You have trouble breathing.  You have side effects of blood thinners, such as blood in your vomit, poop (stool), or pee (urine), or bleeding that cannot stop.  You have any signs of a stroke. "BE FAST" is an easy way to remember the main warning signs: ? B - Balance. Signs are dizziness, sudden trouble walking, or loss of balance. ? E - Eyes. Signs are trouble seeing or a change in how you see. ? F - Face. Signs are sudden weakness or loss of feeling in the face, or the face or eyelid drooping on one side. ? A - Arms. Signs are weakness or loss of feeling in an arm. This happens suddenly and usually on one side of the body. ? S - Speech. Signs are sudden trouble speaking, slurred speech, or trouble understanding what  people say. ? T - Time. Time to call emergency services. Write down what time symptoms started.  You have other signs of a stroke, such as: ? A sudden, very bad headache with no known cause. ? Feeling like you may vomit (nausea). ? Vomiting. ? A seizure. These symptoms may be an emergency. Do not wait to see if the symptoms will go away. Get medical help right away. Call your local emergency services (911 in the [place] Do not drive yourself to the hospital. Summary  Atrial fibrillation is a type of heartbeat that is irregular or fast.  You are at higher risk of this condition if you smoke, are older, have diabetes, or are overweight.  Follow your doctor's instructions about medicines, diet, exercise, and follow-up visits.  Get help right away if you have signs or symptoms of a stroke.  Get help right away if you cannot catch your breath,  or you have chest pain or discomfort. This information is not intended to replace advice given to you by your health care provider. Make sure you discuss any questions you have with your health care provider. Document Revised: 07/25/2018 Document Reviewed: 07/25/2018 Elsevier Patient Education  2020 [employer].

## 2019-10-08 ENCOUNTER — Encounter: Payer: Self-pay | Admitting: Cardiovascular Disease

## 2019-10-08 ENCOUNTER — Other Ambulatory Visit: Payer: Self-pay

## 2019-10-08 ENCOUNTER — Ambulatory Visit (INDEPENDENT_AMBULATORY_CARE_PROVIDER_SITE_OTHER): Payer: Medicare Other | Admitting: Cardiovascular Disease

## 2019-10-08 VITALS — BP 116/72 | HR 72 | Ht 67.0 in

## 2019-10-08 DIAGNOSIS — I480 Paroxysmal atrial fibrillation: Secondary | ICD-10-CM | POA: Diagnosis not present

## 2019-10-08 DIAGNOSIS — I10 Essential (primary) hypertension: Secondary | ICD-10-CM | POA: Diagnosis not present

## 2019-10-08 DIAGNOSIS — I5030 Unspecified diastolic (congestive) heart failure: Secondary | ICD-10-CM | POA: Diagnosis not present

## 2019-10-08 DIAGNOSIS — E785 Hyperlipidemia, unspecified: Secondary | ICD-10-CM | POA: Diagnosis not present

## 2019-10-08 NOTE — Patient Instructions (Signed)

## 2019-10-08 NOTE — Progress Notes (Signed)
Cardiology Office Note   Date:  10/08/2019   ID:  Kayla Reyes, DOB February 27, 1950, MRN [id number]  PCP:  Leanna Sato, MD  Cardiologist:   Lorine Bears, MD   Chief Complaint  Patient presents with  . Other    6 month follow up. Patient c/o swelling in hands. meds reviewed verbally with patient.       History of Present Illness: Kayla Reyes is a 69 y.o. female who presents for a follow-up visit regarding paroxysmal atrial fibrillation.  She has history of arterial embolism with occlusion of the left external iliac artery, common femoral artery and SFA status post thrombectomy and thrombolysis with subsequent BKA in 2008, osteoarthritis, essential hypertension, hyperlipidemia, obesity and hypothyroidism.   She is on long-term anticoagulation with warfarin. Echocardiogram done in December 2019 showed an EF of 55 to 60%. She has been doing reasonably well with no recent chest pain or worsening dyspnea.  No palpitations.  Unfortunately, she is mostly wheelchair-bound.  Anticoagulation is managed by her primary care physician.  Past Medical History:  Diagnosis Date  . Arterial thrombosis (HCC)    a. s/p thromboectomy and thrombolysis with left BKA in 2008 on chronic Coumadin   . HLD (hyperlipidemia)   . Hypertension   . Hypothyroidism   . Obesity   . PAF (paroxysmal atrial fibrillation) (HCC)    a. noted 12/19 in the setting of hypokalemia; b. CHADS2VASc 4 (HTN, age x 1, vascular disease, female); c. on chronic Coumadin in the setting of prior arterial clot  . Rheumatoid arthritis [facility])     Past Surgical History:  Procedure Laterality Date  . BELOW KNEE LEG AMPUTATION       Current Outpatient Medications  Medication Sig Dispense Refill  . albuterol (PROVENTIL HFA;VENTOLIN HFA) 108 (90 Base) MCG/ACT inhaler Inhale 2 puffs into the lungs every 6 (six) hours as needed for wheezing or shortness of breath.    . fluticasone (FLONASE) 50 MCG/ACT nasal spray Place 1 spray into  both nostrils daily as needed for allergies or rhinitis.    . furosemide (LASIX) 20 MG tablet Take 1 tablet (20 mg total) by mouth daily. 30 tablet 5  . gabapentin (NEURONTIN) 600 MG tablet Take 600 mg by mouth 3 (three) times daily.    . hydroxychloroquine (PLAQUENIL) 200 MG tablet TAKE 2 TABLETS(400 MG) BY MOUTH EVERY DAY    . levothyroxine (SYNTHROID, LEVOTHROID) 137 MCG tablet Take 137 mcg by mouth daily before breakfast.    . lovastatin (MEVACOR) 20 MG tablet Take 20 mg by mouth every evening.    . methotrexate (RHEUMATREX) 2.5 MG tablet Take 2.5 mg by mouth once a week.     . metoprolol tartrate (LOPRESSOR) 25 MG tablet Take 25 mg by mouth 2 (two) times daily.     . predniSONE (DELTASONE) 5 MG tablet Take 5 mg by mouth daily with breakfast.     . warfarin (COUMADIN) 10 MG tablet Take 10 mg by mouth daily.     No current facility-administered medications for this visit.    Allergies:   Patient has no known allergies.    Social History:  The patient  reports that she has quit smoking. She has never used smokeless tobacco. She reports previous alcohol use. She reports previous drug use.   Family History:  The patient's family history includes Arthritis in her mother; Cancer in her father; Hypertension in her mother; Thyroid disease in her father.    ROS:  Please see the  history of present illness.   Otherwise, review of systems are positive for none.   All other systems are reviewed and negative.    PHYSICAL EXAM: VS:  BP 116/72 (BP Location: Left Wrist, Patient Position: Sitting, Cuff Size: Normal)   Pulse 72   Ht 5\' 7"  (1.702 m)   SpO2 97%   BMI 62.49 kg/m  , BMI Body mass index is 62.49 kg/m. GEN: Morbidly obese patient in no acute distress. HEENT: normal  Neck: no JVD, carotid bruits, or masses Cardiac: RRR; no murmurs, rubs, or gallops,no edema  Respiratory:  clear to auscultation bilaterally, normal work of breathing GI: soft, nontender, nondistended, + BS MS: no  deformity or atrophy  Skin: warm and dry, no rash Neuro:  Strength and sensation are intact Psych: euthymic mood, full affect   EKG:  EKG is ordered today. The ekg ordered today demonstrates normal sinus rhythm with no significant ST or T wave changes.   Recent Labs: No results found for requested labs within last 8760 hours.    Lipid Panel No results found for: CHOL, TRIG, HDL, CHOLHDL, VLDL, LDLCALC, LDLDIRECT    Wt Readings from Last 3 Encounters:  02/19/18 (!) 399 lb (181 kg)  02/09/18 (!) 399 lb 3.2 oz (181.1 kg)       No flowsheet data found.    ASSESSMENT AND PLAN:  1.  Paroxysmal atrial fibrillation: She continues to be in sinus rhythm with no evidence of significant palpitations.  She is on long-term anticoagulation with warfarin.  2.  Chronic diastolic heart failure: Volume status is difficult to evaluate given morbid obesity.  Recommend continuing small dose furosemide.  3.  Chronic venous insufficiency: Stable.  4.  Essential hypertension: Blood pressure is reasonably controlled.  5.  History of arterial embolism status post left BKA: On long-term anticoagulation with warfarin.  6.  Morbid obesity: Unfortunately, she reports inability to exercise given previous amputation and difficulty using a prosthesis.    Disposition:   FU with me in 1 year  Signed,  02/11/18, MD  10/08/2019 2:42 PM    [facility]

## 2020-09-05 IMAGING — CR DG CHEST 2V
4 series · 4 of 4 positions shown · non-contrast
Comparison: 07/25/2006

CLINICAL DATA: Shortness of breath and leg swelling

EXAM:
CHEST - 2 VIEW

[chest lat (1 of 2)]
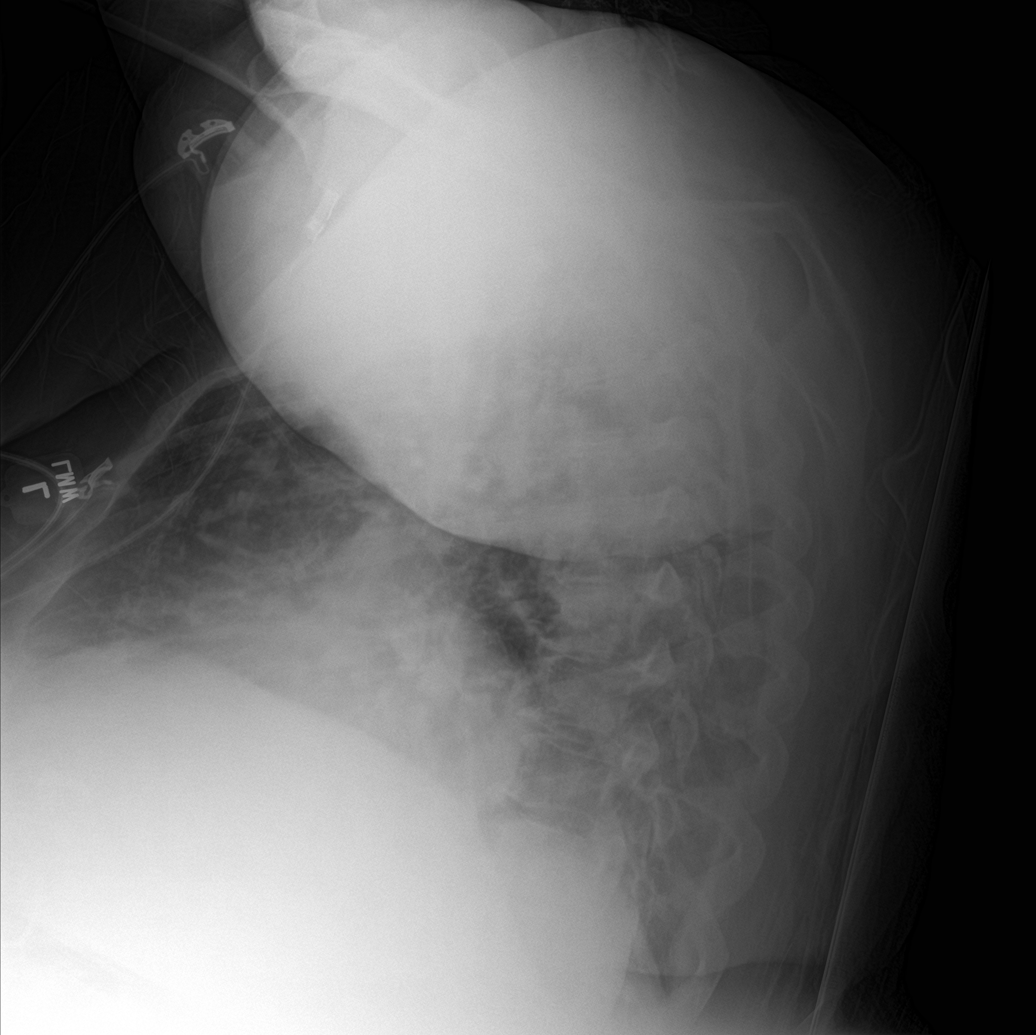

[chest ap (1 of 2)]
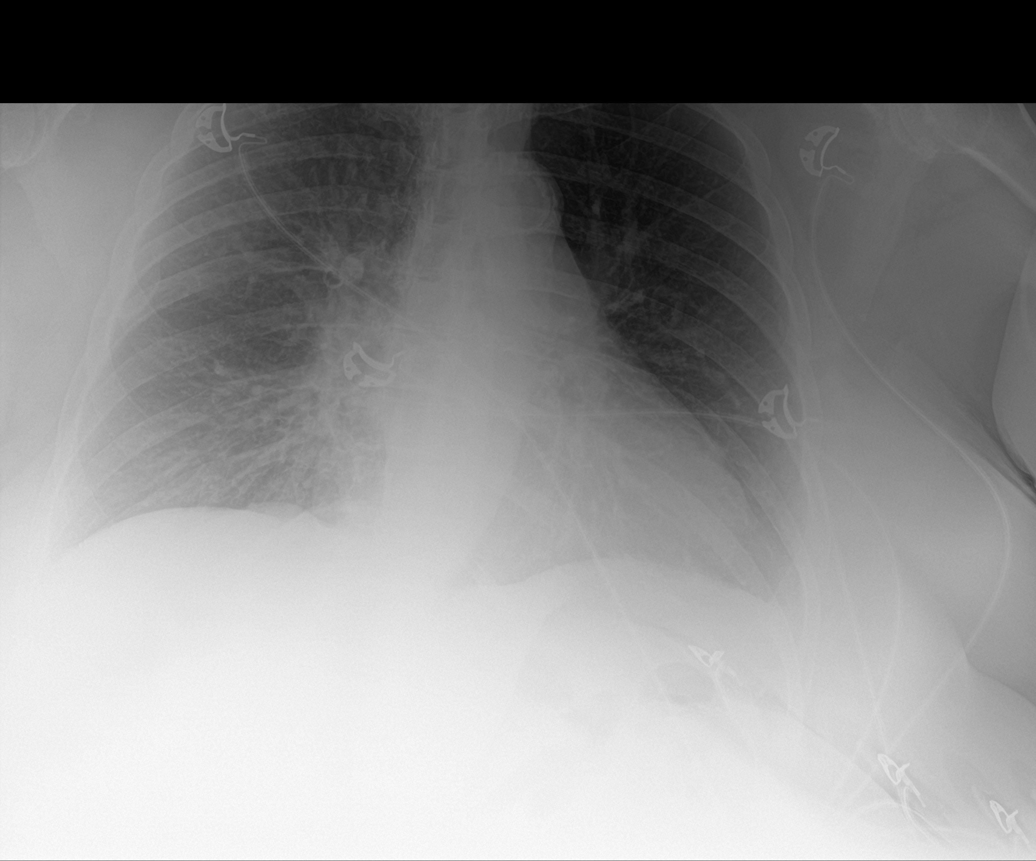

[chest lat (2 of 2)]
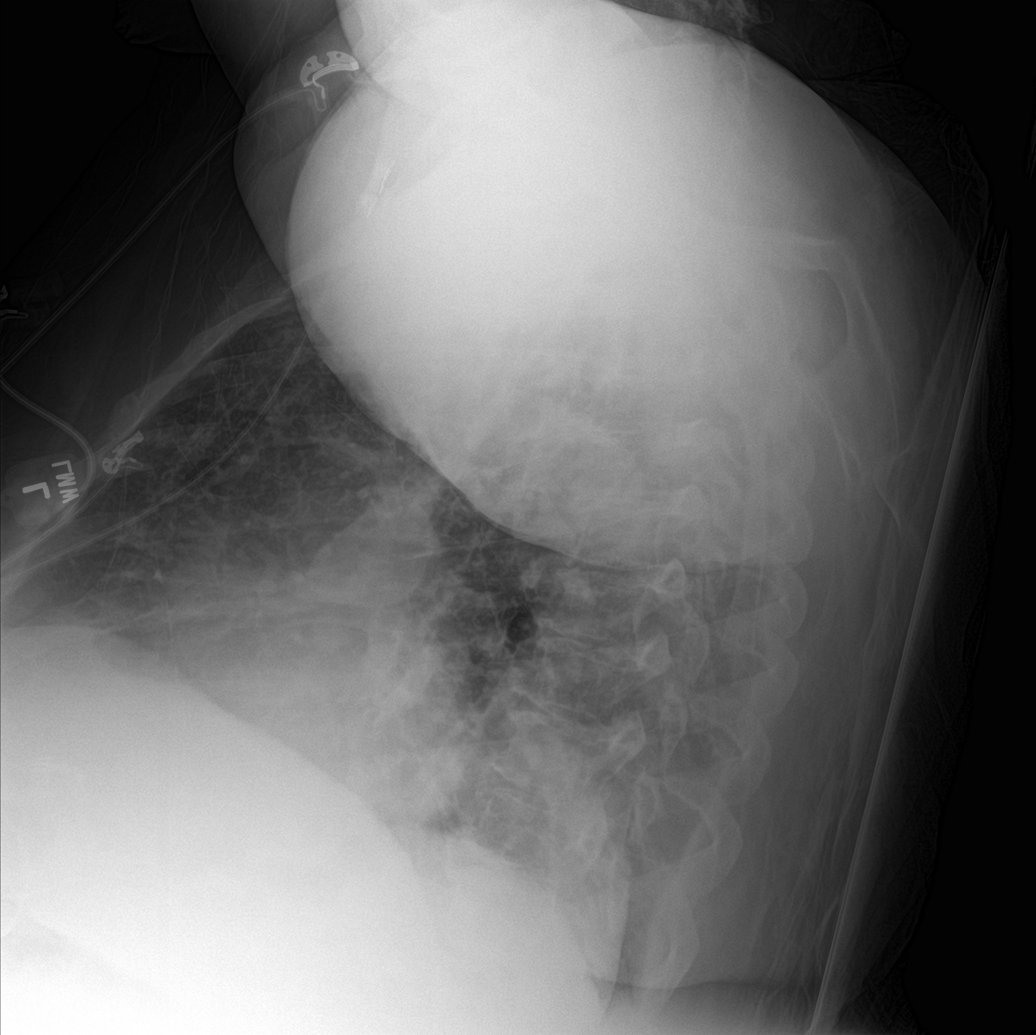

[chest ap (2 of 2)]
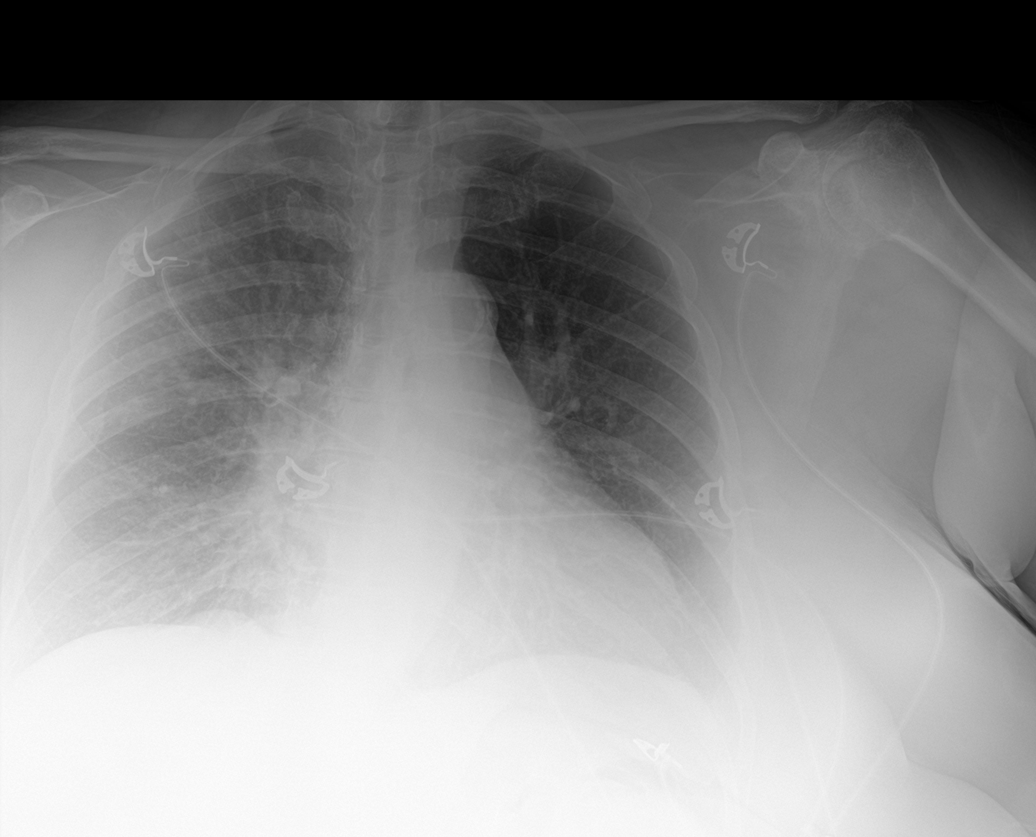

[4 of 4 positions shown; findings below may reference images not displayed]

FINDINGS: Cardiac shadow is within normal limits. Aortic calcifications are
seen. The lungs are well aerated bilaterally with slight
interstitial markings on the right which may represent some
asymmetric edema. No focal infiltrate or effusion is seen. No bony
abnormality is noted.
IMPRESSION: Changes most consistent with asymmetric edema within the right lung.

## 2022-05-10 ENCOUNTER — Inpatient Hospital Stay: Payer: Medicare HMO

## 2022-05-10 ENCOUNTER — Emergency Department: Payer: Medicare HMO

## 2022-05-10 ENCOUNTER — Other Ambulatory Visit: Payer: Self-pay

## 2022-05-10 ENCOUNTER — Inpatient Hospital Stay (HOSPITAL_COMMUNITY)
Admit: 2022-05-10 | Discharge: 2022-05-10 | Disposition: A | Discharge status: Home / Self Care | Payer: Medicare HMO | Attending: Critical Care Medicine | Admitting: Critical Care Medicine

## 2022-05-10 ENCOUNTER — Inpatient Hospital Stay
Admission: EM | Admit: 2022-05-10 | Discharge: 2022-05-19 | DRG: 871 | Disposition: A | Discharge status: Home / Self Care | Payer: Medicare HMO | Attending: Internal Medicine | Admitting: Internal Medicine

## 2022-05-10 ENCOUNTER — Encounter: Payer: Self-pay | Admitting: Radiology

## 2022-05-10 DIAGNOSIS — Z7901 Long term (current) use of anticoagulants: Secondary | ICD-10-CM

## 2022-05-10 DIAGNOSIS — D84821 Immunodeficiency due to drugs: Secondary | ICD-10-CM | POA: Diagnosis present

## 2022-05-10 DIAGNOSIS — Z1152 Encounter for screening for COVID-19: Secondary | ICD-10-CM

## 2022-05-10 DIAGNOSIS — L89152 Pressure ulcer of sacral region, stage 2: Secondary | ICD-10-CM | POA: Diagnosis present

## 2022-05-10 DIAGNOSIS — E785 Hyperlipidemia, unspecified: Secondary | ICD-10-CM | POA: Diagnosis present

## 2022-05-10 DIAGNOSIS — D696 Thrombocytopenia, unspecified: Secondary | ICD-10-CM | POA: Diagnosis not present

## 2022-05-10 DIAGNOSIS — L89159 Pressure ulcer of sacral region, unspecified stage: Secondary | ICD-10-CM

## 2022-05-10 DIAGNOSIS — M1990 Unspecified osteoarthritis, unspecified site: Secondary | ICD-10-CM | POA: Diagnosis present

## 2022-05-10 DIAGNOSIS — E872 Acidosis, unspecified: Secondary | ICD-10-CM | POA: Diagnosis present

## 2022-05-10 DIAGNOSIS — A419 Sepsis, unspecified organism: Secondary | ICD-10-CM | POA: Diagnosis not present

## 2022-05-10 DIAGNOSIS — I2489 Other forms of acute ischemic heart disease: Secondary | ICD-10-CM | POA: Diagnosis present

## 2022-05-10 DIAGNOSIS — Z87891 Personal history of nicotine dependence: Secondary | ICD-10-CM

## 2022-05-10 DIAGNOSIS — I480 Paroxysmal atrial fibrillation: Secondary | ICD-10-CM

## 2022-05-10 DIAGNOSIS — Z79890 Hormone replacement therapy (postmenopausal): Secondary | ICD-10-CM

## 2022-05-10 DIAGNOSIS — Z8349 Family history of other endocrine, nutritional and metabolic diseases: Secondary | ICD-10-CM

## 2022-05-10 DIAGNOSIS — J9601 Acute respiratory failure with hypoxia: Secondary | ICD-10-CM | POA: Diagnosis present

## 2022-05-10 DIAGNOSIS — M793 Panniculitis, unspecified: Secondary | ICD-10-CM | POA: Diagnosis not present

## 2022-05-10 DIAGNOSIS — Z79631 Long term (current) use of antimetabolite agent: Secondary | ICD-10-CM

## 2022-05-10 DIAGNOSIS — N182 Chronic kidney disease, stage 2 (mild): Secondary | ICD-10-CM | POA: Diagnosis present

## 2022-05-10 DIAGNOSIS — R000 Tachycardia, unspecified: Secondary | ICD-10-CM | POA: Diagnosis not present

## 2022-05-10 DIAGNOSIS — E876 Hypokalemia: Secondary | ICD-10-CM | POA: Diagnosis present

## 2022-05-10 DIAGNOSIS — R9431 Abnormal electrocardiogram [ECG] [EKG]: Secondary | ICD-10-CM

## 2022-05-10 DIAGNOSIS — R7401 Elevation of levels of liver transaminase levels: Secondary | ICD-10-CM | POA: Diagnosis present

## 2022-05-10 DIAGNOSIS — Z8261 Family history of arthritis: Secondary | ICD-10-CM

## 2022-05-10 DIAGNOSIS — Z89512 Acquired absence of left leg below knee: Secondary | ICD-10-CM | POA: Diagnosis not present

## 2022-05-10 DIAGNOSIS — I130 Hypertensive heart and chronic kidney disease with heart failure and stage 1 through stage 4 chronic kidney disease, or unspecified chronic kidney disease: Secondary | ICD-10-CM | POA: Diagnosis present

## 2022-05-10 DIAGNOSIS — R053 Chronic cough: Secondary | ICD-10-CM | POA: Diagnosis present

## 2022-05-10 DIAGNOSIS — Z86718 Personal history of other venous thrombosis and embolism: Secondary | ICD-10-CM

## 2022-05-10 DIAGNOSIS — I5033 Acute on chronic diastolic (congestive) heart failure: Secondary | ICD-10-CM | POA: Diagnosis present

## 2022-05-10 DIAGNOSIS — B372 Candidiasis of skin and nail: Secondary | ICD-10-CM | POA: Diagnosis not present

## 2022-05-10 DIAGNOSIS — Z7984 Long term (current) use of oral hypoglycemic drugs: Secondary | ICD-10-CM

## 2022-05-10 DIAGNOSIS — E039 Hypothyroidism, unspecified: Secondary | ICD-10-CM | POA: Diagnosis present

## 2022-05-10 DIAGNOSIS — I4891 Unspecified atrial fibrillation: Secondary | ICD-10-CM

## 2022-05-10 DIAGNOSIS — N179 Acute kidney failure, unspecified: Secondary | ICD-10-CM

## 2022-05-10 DIAGNOSIS — R6520 Severe sepsis without septic shock: Secondary | ICD-10-CM | POA: Diagnosis present

## 2022-05-10 DIAGNOSIS — I959 Hypotension, unspecified: Secondary | ICD-10-CM

## 2022-05-10 DIAGNOSIS — R509 Fever, unspecified: Principal | ICD-10-CM

## 2022-05-10 DIAGNOSIS — M069 Rheumatoid arthritis, unspecified: Secondary | ICD-10-CM | POA: Diagnosis present

## 2022-05-10 DIAGNOSIS — B377 Candidal sepsis: Principal | ICD-10-CM | POA: Diagnosis present

## 2022-05-10 DIAGNOSIS — Z79899 Other long term (current) drug therapy: Secondary | ICD-10-CM

## 2022-05-10 DIAGNOSIS — Z6841 Body Mass Index (BMI) 40.0 and over, adult: Secondary | ICD-10-CM

## 2022-05-10 DIAGNOSIS — R6521 Severe sepsis with septic shock: Secondary | ICD-10-CM

## 2022-05-10 DIAGNOSIS — Z8249 Family history of ischemic heart disease and other diseases of the circulatory system: Secondary | ICD-10-CM

## 2022-05-10 DIAGNOSIS — Z7952 Long term (current) use of systemic steroids: Secondary | ICD-10-CM

## 2022-05-10 DIAGNOSIS — Z7401 Bed confinement status: Secondary | ICD-10-CM

## 2022-05-10 LAB — TROPONIN I (HIGH SENSITIVITY)
Troponin I (High Sensitivity): 38 ng/L — ABNORMAL HIGH (ref <18)
Troponin I (High Sensitivity): 51 ng/L — ABNORMAL HIGH (ref <18)
Troponin I (High Sensitivity): 63 ng/L — ABNORMAL HIGH (ref <18)

## 2022-05-10 LAB — BLOOD GAS, ARTERIAL
Acid-base deficit: 6.6 mmol/L — ABNORMAL HIGH (ref 0.0–2.0)
Acid-base deficit: 9.3 mmol/L — ABNORMAL HIGH (ref 0.0–2.0)
Bicarbonate: 16.7 mmol/L — ABNORMAL LOW (ref 20.0–28.0)
Bicarbonate: 15.5 mmol/L — ABNORMAL LOW (ref 20.0–28.0)
FIO2: 0.21 %
O2 Content: 1.5 L/min
O2 Saturation: 95.8 %
O2 Saturation: 97.5 %
Patient temperature: 37
Patient temperature: 37
pCO2 arterial: 27 mmHg — ABNORMAL LOW (ref 32–48)
pCO2 arterial: 30 mmHg — ABNORMAL LOW (ref 32–48)
pH, Arterial: 7.4 (ref 7.35–7.45)
pH, Arterial: 7.32 — ABNORMAL LOW (ref 7.35–7.45)
pO2, Arterial: 71 mmHg — ABNORMAL LOW (ref 83–108)
pO2, Arterial: 78 mmHg — ABNORMAL LOW (ref 83–108)

## 2022-05-10 LAB — CBC WITH DIFFERENTIAL/PLATELET
Abs Immature Granulocytes: 0.04 10*3/uL (ref 0.00–0.07)
Basophils Absolute: 0 10*3/uL (ref 0.0–0.1)
Basophils Relative: 1 %
Eosinophils Absolute: 0 10*3/uL (ref 0.0–0.5)
Eosinophils Relative: 0 %
HCT: 48.8 % — ABNORMAL HIGH (ref 36.0–46.0)
Hemoglobin: 15.2 g/dL — ABNORMAL HIGH (ref 12.0–15.0)
Immature Granulocytes: 1 %
Lymphocytes Relative: 16 %
Lymphs Abs: 0.7 10*3/uL (ref 0.7–4.0)
MCH: 30.2 pg (ref 26.0–34.0)
MCHC: 31.1 g/dL (ref 30.0–36.0)
MCV: 97 fL (ref 80.0–100.0)
Monocytes Absolute: 0.1 10*3/uL (ref 0.1–1.0)
Monocytes Relative: 2 %
Neutro Abs: 3.5 10*3/uL (ref 1.7–7.7)
Neutrophils Relative %: 80 %
Platelets: 217 10*3/uL (ref 150–400)
RBC: 5.03 MIL/uL (ref 3.87–5.11)
RDW: 17.4 % — ABNORMAL HIGH (ref 11.5–15.5)
Smear Review: NORMAL
WBC: 4.4 10*3/uL (ref 4.0–10.5)
nRBC: 0.5 % — ABNORMAL HIGH (ref 0.0–0.2)

## 2022-05-10 LAB — BASIC METABOLIC PANEL
Anion gap: 10 (ref 5–15)
BUN: 23 mg/dL (ref 8–23)
CO2: 18 mmol/L — ABNORMAL LOW (ref 22–32)
Calcium: 7.5 mg/dL — ABNORMAL LOW (ref 8.9–10.3)
Chloride: 106 mmol/L (ref 98–111)
Creatinine, Ser: 1.38 mg/dL — ABNORMAL HIGH (ref 0.44–1.00)
GFR, Estimated: 41 mL/min — ABNORMAL LOW (ref >60)
Glucose, Bld: 183 mg/dL — ABNORMAL HIGH (ref 70–99)
Potassium: 3.6 mmol/L (ref 3.5–5.1)
Sodium: 134 mmol/L — ABNORMAL LOW (ref 135–145)

## 2022-05-10 LAB — GLUCOSE, CAPILLARY
Glucose-Capillary: 123 mg/dL — ABNORMAL HIGH (ref 70–99)
Glucose-Capillary: 134 mg/dL — ABNORMAL HIGH (ref 70–99)
Glucose-Capillary: 152 mg/dL — ABNORMAL HIGH (ref 70–99)
Glucose-Capillary: 126 mg/dL — ABNORMAL HIGH (ref 70–99)
Glucose-Capillary: 168 mg/dL — ABNORMAL HIGH (ref 70–99)

## 2022-05-10 LAB — URINALYSIS, W/ REFLEX TO CULTURE (INFECTION SUSPECTED)
Bacteria, UA: NONE SEEN
Bilirubin Urine: NEGATIVE
Glucose, UA: >=500 mg/dL — AB
Ketones, ur: NEGATIVE mg/dL
Leukocytes,Ua: NEGATIVE
Nitrite: NEGATIVE
Protein, ur: NEGATIVE mg/dL
Specific Gravity, Urine: 1.015 (ref 1.005–1.030)
pH: 5 (ref 5.0–8.0)

## 2022-05-10 LAB — BLOOD GAS, VENOUS
Acid-Base Excess: 6.9 mmol/L — ABNORMAL HIGH (ref 0.0–2.0)
Bicarbonate: 32 mmol/L — ABNORMAL HIGH (ref 20.0–28.0)
O2 Saturation: 89.4 %
Patient temperature: 37
pCO2, Ven: 46 mmHg (ref 44–60)
pH, Ven: 7.45 — ABNORMAL HIGH (ref 7.25–7.43)
pO2, Ven: 55 mmHg — ABNORMAL HIGH (ref 32–45)

## 2022-05-10 LAB — RESPIRATORY PANEL BY PCR

## 2022-05-10 LAB — COMPREHENSIVE METABOLIC PANEL
ALT: 19 U/L (ref 0–44)
AST: 49 U/L — ABNORMAL HIGH (ref 15–41)
Albumin: 3 g/dL — ABNORMAL LOW (ref 3.5–5.0)
Alkaline Phosphatase: 29 U/L — ABNORMAL LOW (ref 38–126)
Anion gap: 11 (ref 5–15)
BUN: 18 mg/dL (ref 8–23)
CO2: 19 mmol/L — ABNORMAL LOW (ref 22–32)
Calcium: 8.1 mg/dL — ABNORMAL LOW (ref 8.9–10.3)
Chloride: 108 mmol/L (ref 98–111)
Creatinine, Ser: 1.94 mg/dL — ABNORMAL HIGH (ref 0.44–1.00)
GFR, Estimated: 28 mL/min — ABNORMAL LOW (ref >60)
Glucose, Bld: 159 mg/dL — ABNORMAL HIGH (ref 70–99)
Potassium: 3.2 mmol/L — ABNORMAL LOW (ref 3.5–5.1)
Sodium: 138 mmol/L (ref 135–145)
Total Bilirubin: 0.8 mg/dL (ref 0.3–1.2)
Total Protein: 6.3 g/dL — ABNORMAL LOW (ref 6.5–8.1)

## 2022-05-10 LAB — T4, FREE: Free T4: 1.03 ng/dL (ref 0.61–1.12)

## 2022-05-10 LAB — HEPARIN LEVEL (UNFRACTIONATED)
Heparin Unfractionated: 0.13 IU/mL — ABNORMAL LOW (ref 0.30–0.70)
Heparin Unfractionated: 0.39 IU/mL (ref 0.30–0.70)
Heparin Unfractionated: 0.3 IU/mL (ref 0.30–0.70)

## 2022-05-10 LAB — PROTIME-INR
INR: 2.8 — ABNORMAL HIGH (ref 0.8–1.2)
INR: 3.7 — ABNORMAL HIGH (ref 0.8–1.2)
Prothrombin Time: 29.1 seconds — ABNORMAL HIGH (ref 11.4–15.2)
Prothrombin Time: 36.4 seconds — ABNORMAL HIGH (ref 11.4–15.2)

## 2022-05-10 LAB — MRSA NEXT GEN BY PCR, NASAL: MRSA by PCR Next Gen: DETECTED — AB

## 2022-05-10 LAB — RENAL FUNCTION PANEL
Albumin: 2.6 g/dL — ABNORMAL LOW (ref 3.5–5.0)
Anion gap: 15 (ref 5–15)
BUN: 23 mg/dL (ref 8–23)
CO2: 15 mmol/L — ABNORMAL LOW (ref 22–32)
Calcium: 7.8 mg/dL — ABNORMAL LOW (ref 8.9–10.3)
Chloride: 103 mmol/L (ref 98–111)
Creatinine, Ser: 1.71 mg/dL — ABNORMAL HIGH (ref 0.44–1.00)
GFR, Estimated: 32 mL/min — ABNORMAL LOW (ref >60)
Glucose, Bld: 174 mg/dL — ABNORMAL HIGH (ref 70–99)
Phosphorus: 4 mg/dL (ref 2.5–4.6)
Potassium: 3.7 mmol/L (ref 3.5–5.1)
Sodium: 133 mmol/L — ABNORMAL LOW (ref 135–145)

## 2022-05-10 LAB — LACTIC ACID, PLASMA
Lactic Acid, Venous: 8.6 mmol/L (ref 0.5–1.9)
Lactic Acid, Venous: >9 mmol/L (ref 0.5–1.9)
Lactic Acid, Venous: 7.9 mmol/L (ref 0.5–1.9)
Lactic Acid, Venous: 7.5 mmol/L (ref 0.5–1.9)
Lactic Acid, Venous: 6.2 mmol/L (ref 0.5–1.9)
Lactic Acid, Venous: 4.6 mmol/L (ref 0.5–1.9)

## 2022-05-10 LAB — RESP PANEL BY RT-PCR (RSV, FLU A&B, COVID)  RVPGX2
Influenza A by PCR: NEGATIVE
Influenza B by PCR: NEGATIVE
Resp Syncytial Virus by PCR: NEGATIVE
SARS Coronavirus 2 by RT PCR: NEGATIVE

## 2022-05-10 LAB — PROCALCITONIN: Procalcitonin: 78.45 ng/mL

## 2022-05-10 LAB — HIV ANTIBODY (ROUTINE TESTING W REFLEX): HIV Screen 4th Generation wRfx: NONREACTIVE

## 2022-05-10 LAB — BRAIN NATRIURETIC PEPTIDE: B Natriuretic Peptide: 103.2 pg/mL — ABNORMAL HIGH (ref 0.0–100.0)

## 2022-05-10 LAB — APTT: aPTT: 31 seconds (ref 24–36)

## 2022-05-10 LAB — MAGNESIUM
Magnesium: 1.6 mg/dL — ABNORMAL LOW (ref 1.7–2.4)
Magnesium: 1.8 mg/dL (ref 1.7–2.4)

## 2022-05-10 LAB — PHOSPHORUS: Phosphorus: 3.1 mg/dL (ref 2.5–4.6)

## 2022-05-10 LAB — TSH: TSH: 0.971 u[IU]/mL (ref 0.350–4.500)

## 2022-05-10 MED ORDER — ALBUTEROL SULFATE (2.5 MG/3ML) 0.083% IN NEBU
2.5000 mg | INHALATION_SOLUTION | RESPIRATORY_TRACT | Status: DC | PRN
  Administered 2022-05-10: 2.5 mg via RESPIRATORY_TRACT
  Filled 2022-05-10: qty 3

## 2022-05-10 MED ORDER — VASOPRESSIN 20 UNITS/100 ML INFUSION FOR SHOCK
0.0000 [IU]/min | INTRAVENOUS | Status: DC
  Administered 2022-05-10: 0.01 [IU]/min via INTRAVENOUS
  Administered 2022-05-11: 0.03 [IU]/min via INTRAVENOUS
  Administered 2022-05-11: 0.04 [IU]/min via INTRAVENOUS
  Administered 2022-05-12: 0.03 [IU]/min via INTRAVENOUS
  Administered 2022-05-12 – 2022-05-13 (×3): 0.04 [IU]/min via INTRAVENOUS
  Filled 2022-05-10 (×7): qty 100

## 2022-05-10 MED ORDER — POTASSIUM CHLORIDE 10 MEQ/100ML IV SOLN
10.0000 meq | INTRAVENOUS | Status: AC
  Administered 2022-05-10 (×3): 10 meq via INTRAVENOUS
  Filled 2022-05-10 (×3): qty 100

## 2022-05-10 MED ORDER — MEDIHONEY WOUND/BURN DRESSING EX PSTE
1.0000 | PASTE | Freq: Every day | CUTANEOUS | Status: DC
  Administered 2022-05-10 – 2022-05-18 (×9): 1 via TOPICAL
  Filled 2022-05-10: qty 44

## 2022-05-10 MED ORDER — ACETAMINOPHEN 325 MG RE SUPP
RECTAL | Status: AC
  Administered 2022-05-10: 975 mg via RECTAL
  Filled 2022-05-10: qty 3

## 2022-05-10 MED ORDER — THIAMINE HCL 100 MG/ML IJ SOLN
100.0000 mg | Freq: Every day | INTRAMUSCULAR | Status: DC
  Administered 2022-05-10 – 2022-05-17 (×8): 100 mg via INTRAVENOUS
  Filled 2022-05-10 (×8): qty 2

## 2022-05-10 MED ORDER — LIDOCAINE HCL 1 % IJ SOLN: 5.0000 mL | Freq: Once | INTRAMUSCULAR | Status: AC

## 2022-05-10 MED ORDER — FLUCONAZOLE IN SODIUM CHLORIDE 400-0.9 MG/200ML-% IV SOLN
400.0000 mg | Freq: Once | INTRAVENOUS | Status: AC
  Administered 2022-05-10: 400 mg via INTRAVENOUS
  Filled 2022-05-10: qty 200

## 2022-05-10 MED ORDER — IOHEXOL 9 MG/ML PO SOLN
1000.0000 mL | Freq: Once | ORAL | Status: AC | PRN
  Administered 2022-05-10: 1000 mL via ORAL

## 2022-05-10 MED ORDER — INSULIN ASPART 100 UNIT/ML IJ SOLN
0.0000 [IU] | INTRAMUSCULAR | Status: DC
  Administered 2022-05-10: 2 [IU] via SUBCUTANEOUS
  Administered 2022-05-10: 3 [IU] via SUBCUTANEOUS
  Administered 2022-05-10 – 2022-05-11 (×6): 2 [IU] via SUBCUTANEOUS
  Administered 2022-05-11: 3 [IU] via SUBCUTANEOUS
  Administered 2022-05-11 – 2022-05-12 (×3): 2 [IU] via SUBCUTANEOUS
  Administered 2022-05-12: 1 [IU] via SUBCUTANEOUS
  Administered 2022-05-13: 3 [IU] via SUBCUTANEOUS
  Administered 2022-05-13 – 2022-05-14 (×6): 2 [IU] via SUBCUTANEOUS
  Administered 2022-05-15: 3 [IU] via SUBCUTANEOUS
  Filled 2022-05-10 (×21): qty 1

## 2022-05-10 MED ORDER — LACTATED RINGERS IV BOLUS
500.0000 mL | Freq: Once | INTRAVENOUS | Status: AC
  Administered 2022-05-10: 500 mL via INTRAVENOUS

## 2022-05-10 MED ORDER — IPRATROPIUM-ALBUTEROL 0.5-2.5 (3) MG/3ML IN SOLN
3.0000 mL | Freq: Four times a day (QID) | RESPIRATORY_TRACT | Status: DC
  Administered 2022-05-10 – 2022-05-11 (×4): 3 mL via RESPIRATORY_TRACT
  Filled 2022-05-10 (×6): qty 3

## 2022-05-10 MED ORDER — FLUCONAZOLE IN SODIUM CHLORIDE 200-0.9 MG/100ML-% IV SOLN
200.0000 mg | INTRAVENOUS | Status: DC
  Administered 2022-05-11 – 2022-05-17 (×7): 200 mg via INTRAVENOUS
  Filled 2022-05-10 (×8): qty 100

## 2022-05-10 MED ORDER — PANTOPRAZOLE SODIUM 40 MG IV SOLR
40.0000 mg | Freq: Every day | INTRAVENOUS | Status: DC
  Administered 2022-05-10 – 2022-05-17 (×8): 40 mg via INTRAVENOUS
  Filled 2022-05-10 (×8): qty 10

## 2022-05-10 MED ORDER — SODIUM CHLORIDE 0.9% FLUSH
10.0000 mL | Freq: Two times a day (BID) | INTRAVENOUS | Status: DC
  Administered 2022-05-10 – 2022-05-13 (×7): 10 mL
  Administered 2022-05-14: 40 mL
  Administered 2022-05-14 – 2022-05-15 (×2): 10 mL
  Administered 2022-05-15: 40 mL
  Administered 2022-05-16 – 2022-05-17 (×3): 10 mL
  Administered 2022-05-17: 20 mL
  Administered 2022-05-18: 10 mL

## 2022-05-10 MED ORDER — NOREPINEPHRINE 4 MG/250ML-% IV SOLN
2.0000 ug/min | INTRAVENOUS | Status: DC
  Administered 2022-05-10: 6 ug/min via INTRAVENOUS
  Administered 2022-05-10: 8 ug/min via INTRAVENOUS
  Administered 2022-05-10: 2 ug/min via INTRAVENOUS
  Filled 2022-05-10 (×2): qty 250

## 2022-05-10 MED ORDER — SODIUM CHLORIDE 0.9% FLUSH: 10.0000 mL | INTRAVENOUS | Status: DC | PRN

## 2022-05-10 MED ORDER — ACETAMINOPHEN 650 MG RE SUPP: 650.0000 mg | Freq: Four times a day (QID) | RECTAL | Status: DC | PRN

## 2022-05-10 MED ORDER — AMIODARONE HCL IN DEXTROSE 360-4.14 MG/200ML-% IV SOLN
30.0000 mg/h | INTRAVENOUS | Status: DC
  Administered 2022-05-11 (×2): 30 mg/h via INTRAVENOUS
  Filled 2022-05-10: qty 200

## 2022-05-10 MED ORDER — LACTATED RINGERS IV BOLUS
1000.0000 mL | Freq: Once | INTRAVENOUS | Status: AC
  Administered 2022-05-10: 1000 mL via INTRAVENOUS

## 2022-05-10 MED ORDER — HEPARIN (PORCINE) 25000 UT/250ML-% IV SOLN
1900.0000 [IU]/h | INTRAVENOUS | Status: DC
  Administered 2022-05-10: 2000 [IU]/h via INTRAVENOUS
  Administered 2022-05-10: 1700 [IU]/h via INTRAVENOUS
  Administered 2022-05-11 (×2): 2000 [IU]/h via INTRAVENOUS
  Administered 2022-05-12 – 2022-05-13 (×4): 2200 [IU]/h via INTRAVENOUS
  Administered 2022-05-14 (×2): 2400 [IU]/h via INTRAVENOUS
  Administered 2022-05-15: 2600 [IU]/h via INTRAVENOUS
  Administered 2022-05-15: 2800 [IU]/h via INTRAVENOUS
  Administered 2022-05-16: 2400 [IU]/h via INTRAVENOUS
  Administered 2022-05-16: 2200 [IU]/h via INTRAVENOUS
  Administered 2022-05-17: 2000 [IU]/h via INTRAVENOUS
  Filled 2022-05-10 (×15): qty 250

## 2022-05-10 MED ORDER — LIDOCAINE HCL 1 % IJ SOLN: 5.0000 mL | Freq: Once | INTRAMUSCULAR | Status: DC

## 2022-05-10 MED ORDER — VANCOMYCIN HCL IN DEXTROSE 1-5 GM/200ML-% IV SOLN
1000.0000 mg | Freq: Once | INTRAVENOUS | Status: DC
  Filled 2022-05-10: qty 200

## 2022-05-10 MED ORDER — MAGNESIUM SULFATE 2 GM/50ML IV SOLN
2.0000 g | Freq: Once | INTRAVENOUS | Status: AC
  Administered 2022-05-10: 2 g via INTRAVENOUS
  Filled 2022-05-10: qty 50

## 2022-05-10 MED ORDER — IPRATROPIUM-ALBUTEROL 0.5-2.5 (3) MG/3ML IN SOLN
3.0000 mL | Freq: Once | RESPIRATORY_TRACT | Status: AC
  Administered 2022-05-10: 3 mL via RESPIRATORY_TRACT

## 2022-05-10 MED ORDER — HYDROCORTISONE SOD SUC (PF) 100 MG IJ SOLR
100.0000 mg | Freq: Every day | INTRAMUSCULAR | Status: DC
  Administered 2022-05-10 – 2022-05-17 (×8): 100 mg via INTRAVENOUS
  Filled 2022-05-10 (×8): qty 2

## 2022-05-10 MED ORDER — LACTATED RINGERS IV BOLUS (SEPSIS)
1000.0000 mL | Freq: Once | INTRAVENOUS | Status: DC
  Administered 2022-05-10: 1000 mL via INTRAVENOUS

## 2022-05-10 MED ORDER — SODIUM CHLORIDE 0.9 % IV SOLN
1.0000 g | Freq: Two times a day (BID) | INTRAVENOUS | Status: DC
  Administered 2022-05-10 – 2022-05-11 (×3): 1 g via INTRAVENOUS
  Filled 2022-05-10: qty 1
  Filled 2022-05-10: qty 20
  Filled 2022-05-10: qty 1
  Filled 2022-05-10: qty 20

## 2022-05-10 MED ORDER — SODIUM CHLORIDE 0.9 % IV SOLN
2.0000 g | Freq: Once | INTRAVENOUS | Status: AC
  Administered 2022-05-10: 2 g via INTRAVENOUS
  Filled 2022-05-10: qty 12.5

## 2022-05-10 MED ORDER — AMIODARONE LOAD VIA INFUSION
150.0000 mg | Freq: Once | INTRAVENOUS | Status: AC
  Administered 2022-05-10: 150 mg via INTRAVENOUS
  Filled 2022-05-10: qty 83.34

## 2022-05-10 MED ORDER — SODIUM CHLORIDE 0.9 % IV SOLN
250.0000 mL | INTRAVENOUS | Status: DC
  Administered 2022-05-10 – 2022-05-14 (×2): 250 mL via INTRAVENOUS

## 2022-05-10 MED ORDER — METRONIDAZOLE 500 MG/100ML IV SOLN
500.0000 mg | Freq: Once | INTRAVENOUS | Status: AC
  Administered 2022-05-10: 500 mg via INTRAVENOUS
  Filled 2022-05-10: qty 100

## 2022-05-10 MED ORDER — SODIUM BICARBONATE 8.4 % IV SOLN
100.0000 meq | Freq: Once | INTRAVENOUS | Status: AC
  Administered 2022-05-10: 100 meq via INTRAVENOUS
  Filled 2022-05-10: qty 50

## 2022-05-10 MED ORDER — LEVOTHYROXINE SODIUM 137 MCG PO TABS
137.0000 ug | ORAL_TABLET | Freq: Every day | ORAL | Status: DC
  Filled 2022-05-10 (×2): qty 1

## 2022-05-10 MED ORDER — VANCOMYCIN HCL IN DEXTROSE 1-5 GM/200ML-% IV SOLN
1000.0000 mg | Freq: Once | INTRAVENOUS | Status: AC
  Administered 2022-05-10: 1000 mg via INTRAVENOUS

## 2022-05-10 MED ORDER — AMIODARONE LOAD VIA INFUSION: 150.0000 mg | Freq: Once | INTRAVENOUS | Status: DC

## 2022-05-10 MED ORDER — NOREPINEPHRINE 16 MG/250ML-% IV SOLN
0.0000 ug/min | INTRAVENOUS | Status: DC
  Administered 2022-05-10: 8 ug/min via INTRAVENOUS
  Filled 2022-05-10 (×2): qty 250

## 2022-05-10 MED ORDER — HYDROXYCHLOROQUINE SULFATE 200 MG PO TABS
200.0000 mg | ORAL_TABLET | Freq: Every day | ORAL | Status: DC
  Filled 2022-05-10: qty 1

## 2022-05-10 MED ORDER — PANTOPRAZOLE SODIUM 40 MG PO TBEC: 40.0000 mg | DELAYED_RELEASE_TABLET | Freq: Every day | ORAL | Status: DC

## 2022-05-10 MED ORDER — MUPIROCIN 2 % EX OINT
1.0000 | TOPICAL_OINTMENT | Freq: Two times a day (BID) | CUTANEOUS | Status: AC
  Administered 2022-05-10 – 2022-05-14 (×10): 1 via NASAL
  Filled 2022-05-10: qty 22

## 2022-05-10 MED ORDER — HEPARIN BOLUS VIA INFUSION
3000.0000 [IU] | Freq: Once | INTRAVENOUS | Status: AC
  Administered 2022-05-10: 3000 [IU] via INTRAVENOUS
  Filled 2022-05-10: qty 3000

## 2022-05-10 MED ORDER — ACETAMINOPHEN 325 MG PO TABS: 650.0000 mg | ORAL_TABLET | Freq: Four times a day (QID) | ORAL | Status: DC | PRN

## 2022-05-10 MED ORDER — ZINC OXIDE 40 % EX OINT
TOPICAL_OINTMENT | Freq: Three times a day (TID) | CUTANEOUS | Status: DC
  Administered 2022-05-13 – 2022-05-15 (×3): 1 via TOPICAL
  Filled 2022-05-10 (×3): qty 113

## 2022-05-10 MED ORDER — METHYLPREDNISOLONE SODIUM SUCC 40 MG IJ SOLR: 40.0000 mg | Freq: Every day | INTRAMUSCULAR | Status: DC

## 2022-05-10 MED ORDER — DOCUSATE SODIUM 100 MG PO CAPS: 100.0000 mg | ORAL_CAPSULE | Freq: Two times a day (BID) | ORAL | Status: DC | PRN

## 2022-05-10 MED ORDER — METHYLPREDNISOLONE SODIUM SUCC 40 MG IJ SOLR
40.0000 mg | Freq: Two times a day (BID) | INTRAMUSCULAR | Status: DC
  Filled 2022-05-10: qty 1

## 2022-05-10 MED ORDER — ALBUMIN HUMAN 25 % IV SOLN
25.0000 g | Freq: Once | INTRAVENOUS | Status: AC
  Administered 2022-05-10: 25 g via INTRAVENOUS
  Filled 2022-05-10: qty 100

## 2022-05-10 MED ORDER — AMIODARONE HCL IN DEXTROSE 360-4.14 MG/200ML-% IV SOLN: 30.0000 mg/h | INTRAVENOUS | Status: DC

## 2022-05-10 MED ORDER — STERILE WATER FOR INJECTION IV SOLN
INTRAVENOUS | Status: DC
  Filled 2022-05-10: qty 1000
  Filled 2022-05-10 (×2): qty 150
  Filled 2022-05-10: qty 1000

## 2022-05-10 MED ORDER — METRONIDAZOLE 500 MG/100ML IV SOLN: 500.0000 mg | Freq: Two times a day (BID) | INTRAVENOUS | Status: DC

## 2022-05-10 MED ORDER — CHLORHEXIDINE GLUCONATE CLOTH 2 % EX PADS
6.0000 | MEDICATED_PAD | Freq: Every day | CUTANEOUS | Status: DC
  Administered 2022-05-10 – 2022-05-13 (×4): 6 via TOPICAL

## 2022-05-10 MED ORDER — AMIODARONE HCL IN DEXTROSE 360-4.14 MG/200ML-% IV SOLN
60.0000 mg/h | INTRAVENOUS | Status: DC
  Administered 2022-05-10 (×2): 60 mg/h via INTRAVENOUS
  Filled 2022-05-10 (×2): qty 200

## 2022-05-10 MED ORDER — ONDANSETRON HCL 4 MG/2ML IJ SOLN
INTRAMUSCULAR | Status: AC
  Administered 2022-05-10: 4 mg
  Filled 2022-05-10: qty 2

## 2022-05-10 MED ORDER — POLYETHYLENE GLYCOL 3350 17 G PO PACK: 17.0000 g | PACK | Freq: Every day | ORAL | Status: DC | PRN

## 2022-05-10 MED ORDER — AMIODARONE HCL IN DEXTROSE 360-4.14 MG/200ML-% IV SOLN: 60.0000 mg/h | INTRAVENOUS | Status: DC

## 2022-05-10 MED ORDER — ACETAMINOPHEN 325 MG RE SUPP: 975.0000 mg | Freq: Once | RECTAL | Status: AC

## 2022-05-10 MED ORDER — ONDANSETRON HCL 4 MG/2ML IJ SOLN
4.0000 mg | Freq: Four times a day (QID) | INTRAMUSCULAR | Status: DC | PRN
  Administered 2022-05-10 – 2022-05-11 (×4): 4 mg via INTRAVENOUS
  Filled 2022-05-10 (×4): qty 2

## 2022-05-10 MED ORDER — NOREPINEPHRINE 4 MG/250ML-% IV SOLN: 0.0000 ug/min | INTRAVENOUS | Status: DC

## 2022-05-10 MED ORDER — METOPROLOL TARTRATE 5 MG/5ML IV SOLN
2.5000 mg | INTRAVENOUS | Status: DC | PRN
  Administered 2022-05-10: 2.5 mg via INTRAVENOUS
  Filled 2022-05-10: qty 5

## 2022-05-10 MED ORDER — VANCOMYCIN VARIABLE DOSE PER UNSTABLE RENAL FUNCTION (PHARMACIST DOSING): Status: DC

## 2022-05-10 MED ORDER — VANCOMYCIN HCL 1500 MG/300ML IV SOLN
1500.0000 mg | Freq: Once | INTRAVENOUS | Status: AC
  Administered 2022-05-10: 1500 mg via INTRAVENOUS
  Filled 2022-05-10: qty 300

## 2022-05-10 MED ORDER — LIDOCAINE HCL 1 % IJ SOLN
INTRAMUSCULAR | Status: AC
  Administered 2022-05-10: 10 mL
  Filled 2022-05-10: qty 10

## 2022-05-10 NOTE — Consult Note (Signed)
PHARMACY CONSULT NOTE - FOLLOW UP  Pharmacy Consult for Electrolyte Monitoring and Replacement   Recent Labs: Potassium (mmol/L)  Date Value  05/10/2022 3.2 (L)   Magnesium (mg/dL)  Date Value  05/10/2022 1.6 (L)   Calcium (mg/dL)  Date Value  05/10/2022 8.1 (L)   Albumin (g/dL)  Date Value  05/10/2022 3.0 (L)  02/19/2018 3.1 (L)   Phosphorus (mg/dL)  Date Value  05/10/2022 3.1   Sodium (mmol/L)  Date Value  05/10/2022 138  02/19/2018 137     Assessment: 72 yo F presenting to [facility] ED from home via EMS on 05/10/22 for evaluation of altered mental status. Family and patient report that she was in her normal state of health until late in the evening on 05/09/22 when she began having significant chills. Then she became less responsive prompting family to call EMS. Pharmacy has been consulted to monitor and replace electrolytes while under PCCM care.  Goal of Therapy:  Electrolytes WNL  Plan:  CCNP ordered KCL 48mEq IV x 6 doses, Mag sulfate 2g IV x 1 Will defer any further replacement at this time Recheck electrolytes with AM labs   Pearla Dubonnet ,PharmD Clinical Pharmacist 05/10/2022 9:03 AM

## 2022-05-10 NOTE — Progress Notes (Signed)
ANTICOAGULATION CONSULT NOTE - Initial Consult  Pharmacy Consult for Heparin Indication:  arterial thrombosis  No Known Allergies  Patient Measurements: Height: 5\' 7"  (170.2 cm) Weight: (!) 167.1 kg (368 lb 6.2 oz) IBW/kg (Calculated) : 61.6 Heparin Dosing Weight: 104 kg   Vital Signs: Temp: 98.3 F (36.8 C) (03/26 1919) Temp Source: Oral (03/26 1919) BP: 91/33 (03/26 1200) Pulse Rate: 161 (03/26 1945)  Labs: Recent Labs    05/10/22 0327 05/10/22 0532 05/10/22 0932 05/10/22 1437 05/10/22 1930  HGB 15.2*  --   --   --   --   HCT 48.8*  --   --   --   --   PLT 217  --   --   --   --   APTT 31  --   --   --   --   LABPROT 29.1*  --   --   --   --   INR 2.8*  --   --   --   --   HEPARINUNFRC  --   --  0.13*  --  0.39  CREATININE 1.94*  --   --  1.71*  --   TROPONINIHS 38* 51*  --   --  63*     Estimated Creatinine Clearance: 50.9 mL/min (A) (by C-G formula based on SCr of 1.71 mg/dL (H)).   Medical History: Past Medical History:  Diagnosis Date   Arterial thrombosis ([place])    a. 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 noted 12/19 in the setting of hypokalemia; b. CHADS2VASc 4 (HTN, age x 1, vascular disease, female); c. on chronic Coumadin in the setting of prior arterial clot   Rheumatoid arthritis ([place])     Medications:  Medications Prior to Admission  Medication Sig Dispense Refill Last Dose   albuterol (PROVENTIL HFA;VENTOLIN HFA) 108 (90 Base) MCG/ACT inhaler Inhale 2 puffs into the lungs every 6 (six) hours as needed for wheezing or shortness of breath.   prn at unk   fluticasone (FLONASE) 50 MCG/ACT nasal spray Place 1 spray into both nostrils daily as needed for allergies or rhinitis.   prn at unk   furosemide (LASIX) 40 MG tablet Take 40 mg by mouth daily.      gabapentin (NEURONTIN) 600 MG  tablet Take 600 mg by mouth 3 (three) times daily.      hydroxychloroquine (PLAQUENIL) 200 MG tablet TAKE 2 TABLETS(400 MG) BY MOUTH EVERY DAY      levothyroxine (SYNTHROID) 150 MCG tablet Take 150 mcg by mouth daily.      lovastatin (MEVACOR) 20 MG tablet Take 20 mg by mouth every evening.      methotrexate (RHEUMATREX) 2.5 MG tablet Take 2.5 mg by mouth once a week.       metoprolol tartrate (LOPRESSOR) 25 MG tablet Take 25 mg by mouth 2 (two) times daily.       potassium chloride (KLOR-CON M) 10 MEQ tablet Take 10 mEq by mouth daily.      predniSONE (DELTASONE) 1 MG tablet Take by mouth.      TRADJENTA 5 MG TABS tablet Take 5 mg by mouth daily.      warfarin (COUMADIN) 10 MG tablet Take 10 mg by mouth daily.      furosemide (LASIX) 20 MG tablet  Take 1 tablet (20 mg total) by mouth daily. 30 tablet 5    JARDIANCE 10 MG TABS tablet Take 10 mg by mouth daily.   unk at unk   levothyroxine (SYNTHROID, LEVOTHROID) 137 MCG tablet Take 137 mcg by mouth daily before breakfast. (Patient not taking: Reported on 05/10/2022)   Not Taking   predniSONE (DELTASONE) 5 MG tablet Take 5 mg by mouth daily with breakfast.  (Patient not taking: Reported on 05/10/2022)   Not Taking    Assessment: Pharmacy consulted to dose heparin in this 72 year old female admitted for sepsis and hx of PAF and arterial thrombosis.   Pt was on warfarin 10 mg PO daily PTA  3/26:  INR @ 0327 = 2.8  Goal of Therapy:  Heparin level 0.3-0.7 units/ml Monitor platelets by anticoagulation protocol: Yes  Date Time: HL: Rate: 3/26 0932 0.13  Subtherapeutic/ 1700 > 2000 u/hr 3/26 1930 0.39 Therapeutic x1/ 2000 u/hr 3/26     2146   0.30     Therapeutic X 2 / 2000 un/hr 3/27     0214   0.36     Therapeutic X 3   Plan:  3/27:  HL @ 0214 = 0.36, therapeutic X 3 - Will continue pt on current rate and recheck HL on 3/28 with AM labs.   [place] Pharmacist 05/10/2022 9:14 PM

## 2022-05-10 NOTE — Progress Notes (Signed)
Pt had a run of afib rvr then went into ST irregular, then about 20 min later pt went into full on afib rvr while picc was being placed. MD notified at each occurrence. Amio ordered waiting on picc to be inserted and then will complete order stat, MD and NP aware of delay in care because of picc line placement. Will continue to monitor

## 2022-05-10 NOTE — Sepsis Progress Note (Signed)
Following for sepsis monitoring ?

## 2022-05-10 NOTE — ED Triage Notes (Signed)
EMS called out by family for unresponsiveness. Unknown LKN. Per EMS pt oriented to person and event only which is different from baseline. Strong smell of urine on pt, pt voices no c/o. MD Beather Arbour at bedside at this time. Pt unable to answer orientation questions. Open eyes to voice. Placed on hospital bed for comfort

## 2022-05-10 NOTE — Progress Notes (Signed)
ANTICOAGULATION CONSULT NOTE - Initial Consult  Pharmacy Consult for Heparin Indication:  arterial thrombosis  No Known Allergies  Patient Measurements: Height: 5\' 7"  (170.2 cm) Weight: (!) 167.1 kg (368 lb 6.2 oz) IBW/kg (Calculated) : 61.6 Heparin Dosing Weight: 104 kg   Vital Signs: Temp: 99.3 F (37.4 C) (03/26 0430) Temp Source: Oral (03/26 0430) BP: 81/50 (03/26 0530) Pulse Rate: 110 (03/26 0530)  Labs: Recent Labs    05/10/22 0327 05/10/22 0532  HGB 15.2*  --   HCT 48.8*  --   PLT 217  --   APTT 31  --   LABPROT 29.1*  --   INR 2.8*  --   CREATININE 1.94*  --   TROPONINIHS 38* 51*    Estimated Creatinine Clearance: 44.8 mL/min (A) (by C-G formula based on SCr of 1.94 mg/dL (H)).   Medical History: Past Medical History:  Diagnosis Date   Arterial thrombosis ([place])    a. 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 noted 12/19 in the setting of hypokalemia; b. CHADS2VASc 4 (HTN, age x 1, vascular disease, female); c. on chronic Coumadin in the setting of prior arterial clot   Rheumatoid arthritis ([place])     Medications:  Medications Prior to Admission  Medication Sig Dispense Refill Last Dose   albuterol (PROVENTIL HFA;VENTOLIN HFA) 108 (90 Base) MCG/ACT inhaler Inhale 2 puffs into the lungs every 6 (six) hours as needed for wheezing or shortness of breath.      fluticasone (FLONASE) 50 MCG/ACT nasal spray Place 1 spray into both nostrils daily as needed for allergies or rhinitis.      furosemide (LASIX) 20 MG tablet Take 1 tablet (20 mg total) by mouth daily. 30 tablet 5    gabapentin (NEURONTIN) 600 MG tablet Take 600 mg by mouth 3 (three) times daily.      hydroxychloroquine (PLAQUENIL) 200 MG tablet TAKE 2 TABLETS(400 MG) BY MOUTH EVERY DAY      levothyroxine (SYNTHROID, LEVOTHROID) 137 MCG tablet  Take 137 mcg by mouth daily before breakfast.      lovastatin (MEVACOR) 20 MG tablet Take 20 mg by mouth every evening.      methotrexate (RHEUMATREX) 2.5 MG tablet Take 2.5 mg by mouth once a week.       metoprolol tartrate (LOPRESSOR) 25 MG tablet Take 25 mg by mouth 2 (two) times daily.       predniSONE (DELTASONE) 5 MG tablet Take 5 mg by mouth daily with breakfast.       warfarin (COUMADIN) 10 MG tablet Take 10 mg by mouth daily.       Assessment: Pharmacy consulted to dose heparin in this 72 year old female admitted for sepsis and hx of PAF and arterial thrombosis.   Pt was on warfarin 10 mg PO daily PTA  3/26:  INR @ 0327 = 2.8  Goal of Therapy:  Heparin level 0.3-0.7 units/ml Monitor platelets by anticoagulation protocol: Yes   Plan:  No bolus b/c of warfarin PTA  and therapeutic INR.  Start heparin infusion at 1700 units/hr Check anti-Xa level in 6 hours and daily while on heparin Continue to monitor H&H and platelets  Kayla Reyes D 05/10/2022,6:34 AM

## 2022-05-10 NOTE — ED Notes (Signed)
MD Beather Arbour notified of critical lactic verbally at this time

## 2022-05-10 NOTE — Sepsis Progress Note (Signed)
Sepsis bundle complete with lactic acid >9. Now with ICU management. Providers aware and trending lactic acid.

## 2022-05-10 NOTE — Progress Notes (Signed)
Peripherally Inserted Central Catheter Placement  The IV Nurse has discussed with the patient and/or persons authorized to consent for the patient, the purpose of this procedure and the potential benefits and risks involved with this procedure.  The benefits include less needle sticks, lab draws from the catheter, and the patient may be discharged home with the catheter. Risks include, but not limited to, infection, bleeding, blood clot (thrombus formation), and puncture of an artery; nerve damage and irregular heartbeat and possibility to perform a PICC exchange if needed/ordered by physician.  Alternatives to this procedure were also discussed.  Bard Power PICC patient education guide, fact sheet on infection prevention and patient information card has been provided to patient /or left at bedside.    PICC Placement Documentation  PICC Triple Lumen [id number] Right Cephalic 45 cm 0 cm (Active)  Indication for Insertion or Continuance of Line Vasoactive infusions 05/10/22 1753  Exposed Catheter (cm) 0 cm 05/10/22 1753  Site Assessment Clean, Dry, Intact 05/10/22 1753  Lumen #1 Status Flushed;Saline locked;Blood return noted 05/10/22 1753  Lumen #2 Status Flushed;Saline locked;Blood return noted 05/10/22 1753  Lumen #3 Status Flushed;Saline locked;Blood return noted 05/10/22 1753  Dressing Type Transparent;Securing device 05/10/22 1753  Dressing Status Antimicrobial disc in place;Clean, Dry, Intact 05/10/22 1753  Safety Lock Not Applicable [id number]  Line Care Connections checked and tightened 05/10/22 1753  Line Adjustment (NICU/IV Team Only) No 05/10/22 1753  Dressing Intervention New dressing;Other (Comment) 05/10/22 1753  Dressing Change Due 05/17/22 05/10/22 [name], Kayla Reyes 05/10/2022, 5:55 PM

## 2022-05-10 NOTE — Consult Note (Addendum)
Pharmacy Antibiotic Note  Kayla Reyes is a 72 y.o. female admitted on 05/10/2022 with sepsis of unknown origin. PMH significant for arterial thrombus on warfarin s/p L BKA (2008), HTN, HLD, hypothyroidism, AF, obesity, RA, former smoker, HFpEF. She presented to the ED after becoming increasingly unresponsive. At baseline, she is bed bound with stage 2 sacral ulcer. Provider noted yeast on pannus 05/10/22. No reported allergies to antibiotics and no recent hospitalizations per chart review. Pharmacy has been consulted for vancomycin, meropenem, and fluconazole dosing.  Plan: Day 1 of antimicrobials Patient completed vancomycin 2500 mg IV x1. Check random vanc level tomorrow given AKI (SCr 1.94, baseline 0.7 on 03/23/22)  Continue meropenem 1 g IV Q12H Give 400 mg IV x1 then 200 mg daily thereafter Continue to monitor renal function and follow culture results   Height: 5\' 7"  (170.2 cm) Weight: (!) 167.1 kg (368 lb 6.2 oz) IBW/kg (Calculated) : 61.6  Temp (24hrs), Avg:100.2 F (37.9 C), Min:99.1 F (37.3 C), Max:102.7 F (39.3 C)  Recent Labs  Lab 05/10/22 0315 05/10/22 0327 05/10/22 0532 05/10/22 0932 05/10/22 1127  WBC  --  4.4  --   --   --   CREATININE  --  1.94*  --   --   --   LATICACIDVEN 8.6*  --  >9.0* 7.9* 7.5*    Estimated Creatinine Clearance: 44.8 mL/min (A) (by C-G formula based on SCr of 1.94 mg/dL (H)).    No Known Allergies  Antimicrobials this admission: 3/26 Cefepime x1 3/26 Metronidazole x1 3/26 Vancomycin >>  3/26 Meropenem >> 3/26 Fluconazole >>  Dose adjustments this admission: N/A  Microbiology results: 3/26 BCx: IP 3/26 MRSA PCR: positive  Thank you for allowing pharmacy to be a part of this patient's care.  Gretel Acre, PharmD PGY1 Pharmacy Resident 05/10/2022 2:24 PM

## 2022-05-10 NOTE — Progress Notes (Signed)
CODE SEPSIS - PHARMACY COMMUNICATION  **Broad Spectrum Antibiotics should be administered within 1 hour of Sepsis diagnosis**  Time Code Sepsis Called/Page Received: 3/26 @ 0315  Antibiotics Ordered: Vancomycin, Cefepime   Time of 1st antibiotic administration: Cefepime 2 gm IV X 1 given on 3/26 @ 0352   Additional action taken by pharmacy:   If necessary, Name of Provider/Nurse Contacted:     Jayda White D ,PharmD Clinical Pharmacist  05/10/2022  4:04 AM

## 2022-05-10 NOTE — Progress Notes (Signed)
ANTICOAGULATION CONSULT NOTE - Initial Consult  Pharmacy Consult for Heparin Indication:  arterial thrombosis  No Known Allergies  Patient Measurements: Height: 5\' 7"  (170.2 cm) Weight: (!) 167.1 kg (368 lb 6.2 oz) IBW/kg (Calculated) : 61.6 Heparin Dosing Weight: 104 kg   Vital Signs: Temp: 99.5 F (37.5 C) (03/26 0731) Temp Source: Oral (03/26 0731) BP: 91/33 (03/26 1200) Pulse Rate: 115 (03/26 1328)  Labs: Recent Labs    05/10/22 0327 05/10/22 0532 05/10/22 0932  HGB 15.2*  --   --   HCT 48.8*  --   --   PLT 217  --   --   APTT 31  --   --   LABPROT 29.1*  --   --   INR 2.8*  --   --   HEPARINUNFRC  --   --  0.13*  CREATININE 1.94*  --   --   TROPONINIHS 38* 51*  --      Estimated Creatinine Clearance: 44.8 mL/min (A) (by C-G formula based on SCr of 1.94 mg/dL (H)).   Medical History: Past Medical History:  Diagnosis Date   Arterial thrombosis ([place])    a. 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 noted 12/19 in the setting of hypokalemia; b. CHADS2VASc 4 (HTN, age x 1, vascular disease, female); c. on chronic Coumadin in the setting of prior arterial clot   Rheumatoid arthritis ([place])     Medications:  Medications Prior to Admission  Medication Sig Dispense Refill Last Dose   albuterol (PROVENTIL HFA;VENTOLIN HFA) 108 (90 Base) MCG/ACT inhaler Inhale 2 puffs into the lungs every 6 (six) hours as needed for wheezing or shortness of breath.   prn at unk   fluticasone (FLONASE) 50 MCG/ACT nasal spray Place 1 spray into both nostrils daily as needed for allergies or rhinitis.   prn at unk   furosemide (LASIX) 40 MG tablet Take 40 mg by mouth daily.      gabapentin (NEURONTIN) 600 MG tablet Take 600 mg by mouth 3 (three) times daily.      hydroxychloroquine (PLAQUENIL) 200 MG tablet TAKE 2 TABLETS(400 MG)  BY MOUTH EVERY DAY      levothyroxine (SYNTHROID) 150 MCG tablet Take 150 mcg by mouth daily.      lovastatin (MEVACOR) 20 MG tablet Take 20 mg by mouth every evening.      methotrexate (RHEUMATREX) 2.5 MG tablet Take 2.5 mg by mouth once a week.       metoprolol tartrate (LOPRESSOR) 25 MG tablet Take 25 mg by mouth 2 (two) times daily.       potassium chloride (KLOR-CON M) 10 MEQ tablet Take 10 mEq by mouth daily.      predniSONE (DELTASONE) 1 MG tablet Take by mouth.      TRADJENTA 5 MG TABS tablet Take 5 mg by mouth daily.      warfarin (COUMADIN) 10 MG tablet Take 10 mg by mouth daily.      furosemide (LASIX) 20 MG tablet Take 1 tablet (20 mg total) by mouth daily. 30 tablet 5    JARDIANCE 10 MG TABS tablet Take 10 mg by mouth daily.   unk at unk   levothyroxine (SYNTHROID, LEVOTHROID) 137 MCG tablet Take 137 mcg by mouth daily before breakfast. (Patient not taking: Reported on  05/10/2022)   Not Taking   predniSONE (DELTASONE) 5 MG tablet Take 5 mg by mouth daily with breakfast.  (Patient not taking: Reported on 05/10/2022)   Not Taking    Assessment: Pharmacy consulted to dose heparin in this 72 year old female admitted for sepsis and hx of PAF and arterial thrombosis.   Pt was on warfarin 10 mg PO daily PTA  3/26:  INR @ 0327 = 2.8  Goal of Therapy:  Heparin level 0.3-0.7 units/ml Monitor platelets by anticoagulation protocol: Yes  Date/Time: HL: Rate: 3/26@0932  0.13  Subtherapeutic@1700  units/hr   Plan:  - Spoke with CCMD, no signs and symptoms of bleeding, hgb and platelets are stable, in agreement to bolus patient - Bolus 3000 units x1, then increase heparin infusion rate to 2000 units/hr. - Check anti-Xa level in 6 hours after rate change and daily while on heparin - Continue to monitor H&H and platelets. Will recheck INR  Pearla Dubonnet 05/10/2022,3:12 PM

## 2022-05-10 NOTE — ED Provider Notes (Signed)
[facility] Provider Note    Event Date/Time   First MD Initiated Contact with Patient 05/10/22 [phone]     (approximate)   History   Unresponsive   HPI  Level V caveat: Limited by decreased LOC  Kayla Reyes is a 72 y.o. female brought to the ED via EMS from home with a chief complaint of unresponsive.  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  Patient arousable to voice and denies complaints.  Family states patient is usually alert and oriented.  Patient denies chest pain, shortness of breath, abdominal pain, nausea or vomiting.  Strong smell of urine about her.     Past Medical History   Past Medical History:  Diagnosis Date   Arterial thrombosis ([place])    a. 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 noted 12/19 in the setting of hypokalemia; b. CHADS2VASc 4 (HTN, age x 1, vascular disease, female); c. on chronic Coumadin in the setting of prior arterial clot   Rheumatoid arthritis [facility])      Active Problem List   Patient Active Problem List   Diagnosis Date Noted   Pulmonary edema 02/08/2018   Cellulitis 02/08/2018   HTN (hypertension) 02/08/2018   Hypothyroidism 02/08/2018   HLD (hyperlipidemia) 02/08/2018     Past Surgical History   Past Surgical History:  Procedure Laterality Date   BELOW KNEE LEG AMPUTATION       Home Medications   Prior to Admission medications   Medication Sig Start Date End Date Taking? Authorizing Provider  albuterol (PROVENTIL HFA;VENTOLIN HFA) 108 (90 Base) MCG/ACT inhaler Inhale 2 puffs into the lungs every 6 (six) hours as needed for wheezing or shortness of breath.    [provider]  fluticasone (FLONASE) 50 MCG/ACT nasal spray Place 1  spray into both nostrils daily as needed for allergies or rhinitis.    [provider]  furosemide (LASIX) 20 MG tablet Take 1 tablet (20 mg total) by mouth daily. 02/09/18 10/08/19  Salary, Avel Peace, MD  gabapentin (NEURONTIN) 600 MG tablet Take 600 mg by mouth 3 (three) times daily.    [provider]  hydroxychloroquine (PLAQUENIL) 200 MG tablet TAKE 2 TABLETS(400 MG) BY MOUTH EVERY DAY 12/17/18   [provider]  levothyroxine (SYNTHROID, LEVOTHROID) 137 MCG tablet Take 137 mcg by mouth daily before breakfast.    [provider]  lovastatin (MEVACOR) 20 MG tablet Take 20 mg by mouth every evening.    [provider]  methotrexate (RHEUMATREX) 2.5 MG tablet Take 2.5 mg by mouth once a week.  12/25/18   [provider]  metoprolol tartrate (LOPRESSOR) 25 MG tablet Take 25 mg by mouth 2 (two) times daily.  01/10/19   [provider]  predniSONE (DELTASONE) 5 MG tablet Take 5 mg by mouth daily with breakfast.  12/25/18   [provider]  warfarin (COUMADIN) 10 MG tablet Take 10 mg by mouth daily.    [provider]     Allergies  Patient has no known allergies.   Family History   Family History  Problem Relation Age of Onset   Arthritis Mother    Hypertension Mother    Cancer Father  Thyroid disease Father      Physical Exam  Triage Vital Signs: ED Triage Vitals  Enc Vitals Group     BP      Pulse      Resp      Temp      Temp src      SpO2      Weight      Height      Head Circumference      Peak Flow      Pain Score      Pain Loc      Pain Edu?      Excl. in [place]?     Updated Vital Signs: BP (!) 115/53 (BP Location: Left Arm) Comment (BP Location): Left Forearm  Pulse (!) 115   Temp 99.3 F (37.4 C) (Oral)   Resp (!) 29   Ht 5\' 7"  (1.702 m)   Wt (!) 167.1 kg   SpO2 93%   BMI 57.70 kg/m    General: Somnolent but arousable to voice, mild distress.  CV:  Tachycardic.  Good  peripheral perfusion.  Resp:  Increased effort.  Diminished but otherwise CTAB. Abd:  Morbidly obese.  No distention.  Other:  Left BKA.  Sacral decubitus ulcer.   ED Results / Procedures / Treatments  Labs (all labs ordered are listed, but only abnormal results are displayed) Labs Reviewed  LACTIC ACID, PLASMA - Abnormal; Notable for the following components:      Result Value   Lactic Acid, Venous 8.6 (*)    All other components within normal limits  COMPREHENSIVE METABOLIC PANEL - Abnormal; Notable for the following components:   Potassium 3.2 (*)    CO2 19 (*)    Glucose, Bld 159 (*)    Creatinine, Ser 1.94 (*)    Calcium 8.1 (*)    Total Protein 6.3 (*)    Albumin 3.0 (*)    AST 49 (*)    Alkaline Phosphatase 29 (*)    GFR, Estimated 28 (*)    All other components within normal limits  CBC WITH DIFFERENTIAL/PLATELET - Abnormal; Notable for the following components:   Hemoglobin 15.2 (*)    HCT 48.8 (*)    RDW 17.4 (*)    nRBC 0.5 (*)    All other components within normal limits  PROTIME-INR - Abnormal; Notable for the following components:   Prothrombin Time 29.1 (*)    INR 2.8 (*)    All other components within normal limits  URINALYSIS, W/ REFLEX TO CULTURE (INFECTION SUSPECTED) - Abnormal; Notable for the following components:   Color, Urine YELLOW (*)    APPearance HAZY (*)    Glucose, UA >=500 (*)    Hgb urine dipstick SMALL (*)    All other components within normal limits  BLOOD GAS, ARTERIAL - Abnormal; Notable for the following components:   pCO2 arterial 27 (*)    pO2, Arterial 71 (*)    Bicarbonate 16.7 (*)    Acid-base deficit 6.6 (*)    All other components within normal limits  TROPONIN I (HIGH SENSITIVITY) - Abnormal; Notable for the following components:   Troponin I (High Sensitivity) 38 (*)    All other components within normal limits  RESP PANEL BY RT-PCR (RSV, FLU A&B, COVID)  [id number]  CULTURE, BLOOD (ROUTINE X 2)  CULTURE, BLOOD (ROUTINE X  2)  APTT  LACTIC ACID, PLASMA  BRAIN NATRIURETIC PEPTIDE  TROPONIN I (HIGH SENSITIVITY)     EKG  ED ECG REPORT I, Rafaela Dinius J, the attending physician, personally viewed and interpreted this ECG.   Date: 05/10/2022  EKG Time: 0323  Rate: 125  Rhythm: sinus tachycardia  Axis: Normal  Intervals:none  ST&T Change: Nonspecific    RADIOLOGY I have dependently visualized and interpreted patient's x-ray as well as noted the radiology interpretation:  X-ray: gen interstitial opacity most likely edema  Official radiology report(s): DG Chest Port 1 View  Result Date: 05/10/2022 CLINICAL DATA:  Questionable sepsis EXAM: PORTABLE CHEST 1 VIEW COMPARISON:  02/07/2018 FINDINGS: Generalized interstitial thickening. Borderline heart size accentuated by leftward rotation. There could be left pleural fluid that is small volume. No pneumothorax or air bronchogram. IMPRESSION: Generalized interstitial opacity favoring edema. Electronically Signed   By: Jorje Guild M.D.   On: 05/10/2022 04:29     PROCEDURES:  Critical Care performed: Yes, see critical care procedure note(s)  CRITICAL CARE Performed by: Paulette Blanch   Total critical care time: 45 minutes  Critical care time was exclusive of separately billable procedures and treating other patients.  Critical care was necessary to treat or prevent imminent or life-threatening deterioration.  Critical care was time spent personally by me on the following activities: development of treatment plan with patient and/or surrogate as well as nursing, discussions with consultants, evaluation of patient's response to treatment, examination of patient, obtaining history from patient or surrogate, ordering and performing treatments and interventions, ordering and review of laboratory studies, ordering and review of radiographic studies, pulse oximetry and re-evaluation of patient's condition.   Marland Kitchen1-3 Lead EKG Interpretation  Performed by: Paulette Blanch, MD Authorized by: Paulette Blanch, MD     Interpretation: abnormal     ECG rate:  125   ECG rate assessment: tachycardic     Rhythm: sinus tachycardia     Ectopy: none     Conduction: normal   Comments:     Patient placed on cardiac monitor to evaluate for arrhythmias    MEDICATIONS ORDERED IN ED: Medications  metroNIDAZOLE (FLAGYL) IVPB 500 mg (500 mg Intravenous New Bag/Given 05/10/22 0426)  vancomycin (VANCOCIN) IVPB 1000 mg/200 mL premix (has no administration in time range)    Followed by  vancomycin (VANCOREADY) IVPB 1500 mg/300 mL (has no administration in time range)  0.9 %  sodium chloride infusion (has no administration in time range)  norepinephrine (LEVOPHED) 4mg  in 268mL (0.016 mg/mL) premix infusion (has no administration in time range)  sodium bicarbonate injection 100 mEq (has no administration in time range)  ceFEPIme (MAXIPIME) 2 g in sodium chloride 0.9 % 100 mL IVPB (0 g Intravenous Stopped 05/10/22 0426)  acetaminophen (TYLENOL) suppository 975 mg (975 mg Rectal Given 05/10/22 0330)  lactated ringers bolus 1,000 mL (1,000 mLs Intravenous New Bag/Given 05/10/22 0440)     IMPRESSION / MDM / ASSESSMENT AND PLAN / ED COURSE  I reviewed the triage vital signs and the nursing notes.                             72 year old female brought for unresponsive state. Differential diagnosis includes, but is not limited to, alcohol, illicit or prescription medications, or other toxic ingestion; intracranial pathology such as stroke or intracerebral hemorrhage; fever or infectious causes including sepsis; hypoxemia and/or hypercarbia; uremia; trauma; endocrine related disorders such as diabetes, hypoglycemia, and thyroid-related diseases; hypertensive encephalopathy; etc. I have personally reviewed patient's records and note a recent rheumatology visit for her rheumatoid arthritis  on 03/23/2022.  Patient's presentation is most consistent with acute presentation with potential  threat to life or bodily function.  The patient is on the cardiac monitor to evaluate for evidence of arrhythmia and/or significant heart rate changes.  Patient meets sepsis criteria on arrival.  Will initiate ED code sepsis.  Will administer 1 L IV fluids at a time as patient is morbidly obese and do not wish to volume overload her.  Clinical Course as of 05/10/22 0516  Tue May 10, 2022  0420 BP 70/40; will administer second liter IV fluids. [JS]  0433 Lactic acid 8.6; will discuss with ICU intensivist for admission. [JS]    Clinical Course User Index [JS] Paulette Blanch, MD     FINAL CLINICAL IMPRESSION(S) / ED DIAGNOSES   Final diagnoses:  Fever, unspecified fever cause  Sepsis, due to unspecified organism, unspecified whether acute organ dysfunction present ([place])  Pressure injury of skin of sacral region, unspecified injury stage  Hypotension, unspecified hypotension type  AKI (acute kidney injury) ([place])     Rx / DC Orders   ED Discharge Orders     None        Note:  This document was prepared using Dragon voice recognition software and may include unintentional dictation errors.   Paulette Blanch, MD 05/10/22 [phone]

## 2022-05-10 NOTE — Procedures (Signed)
Arterial Catheter Insertion Procedure Note  Kayla Reyes  [id number]  January 18, 1951  Date:05/10/22  Time:9:30 AM    Provider Performing: Teressa Lower    Procedure: Insertion of Arterial Line [phone]) with US guidance [id number])   Indication(s) Blood pressure monitoring and/or need for frequent ABGs  Consent Risks of the procedure as well as the alternatives and risks of each were explained to the patient and/or caregiver.  Consent for the procedure was obtained and is signed in the bedside chart  Anesthesia None   Time Out Verified patient identification, verified procedure, site/side was marked, verified correct patient position, special equipment/implants available, medications/allergies/relevant history reviewed, required imaging and test results available.   Sterile Technique Maximal sterile technique including full sterile barrier drape, hand hygiene, sterile gown, sterile gloves, mask, hair covering, sterile ultrasound probe cover (if used).   Procedure Description Area of catheter insertion was cleaned with chlorhexidine and draped in sterile fashion. With real-time ultrasound guidance an arterial catheter was placed into the right radial artery.  Appropriate arterial tracings confirmed on monitor.     Complications/Tolerance None; patient tolerated the procedure well.   EBL Minimal   Specimen(s) None  Attempted to insert left radial arterial line, however unable to thread catheter.  Therefore, proceeded with right radial arterial line placement   Kayla Reyes, [place] Pager [phone] (please enter 7 digits) PCCM Consult Pager [phone] (please enter 7 digits)

## 2022-05-10 NOTE — Consult Note (Signed)
[place] Nurse Consult Note: Reason for Consult:patient from home who is bedbound, sacral stage 2 pressure injury, left BKA. Irritant contact dermatitis to bilateral buttocks secondary to urinary incontinence.  ICD-10 CM Codes for Irritant Dermatitis L24A9 - Due to friction or contact with other specified body fluids L24A2 - Due to fecal, urinary or dual incontinence L30.4  - Erythema intertrigo. Also used for abrasion of the hand, chafing of the skin, dermatitis due to sweating and friction, friction dermatitis, friction eczema, and genital/thigh intertrigo.   Wound type:Pressure plus moisture Pressure Injury POA: Yes Measurement:To be measured today by bedside RN with implementation of topical care orders. Wound [id number], moist Drainage (amount, consistency, odor) small serous Periwound: intact Dressing procedure/placement/frequency: Patient is on a mattress replacement with low air loss feature as she is in the ICU. Turning and repositioning is in place.I have added guidance to minimize time in the supine position. Topical care to the irritant contact dermatitis will be with Desitin ointment to the buttocks, posterior and medial thighs. Erythema intertrigo will be treated with our house moisture wicking textile, InterDry. Topical care to the pressure injury will be with leptospermum [place] Honey (MediHoney) once daily topped with a dry gauze and secured with a silicone foam.A pressure redistribution heel boot is provided to the right heel to prevent PI.  WOC nursing team will not follow, but will remain available to this patient, the nursing and medical teams.  Please re-consult if needed.  Thank you for inviting [place] to participate in this patient's Plan of Care.  Maudie Flakes, MSN, RN, CNS, [place], Serita Grammes, [employer], [employer] phone:  [phone]

## 2022-05-10 NOTE — Progress Notes (Signed)
Pharmacy Antibiotic Note  Kayla Reyes is a 72 y.o. female admitted on 05/10/2022 with sepsis, acute renal failure  Pharmacy has been consulted for Vancomycin, meropenem dosing.  Will dose by levels due to unstable renal function.   Plan: Meropenem 1 gm IV Q12H ordered to start on 3/26 @ 0700.   Vancomycin 1 gm IV X 1 given on 3/26 @ 0539 followed by Vancomycin 1500 mg IV X 1 @ 0712 to make total loading dose of 2500 mg.   - Pharmacist dosing unstable renal function due to septic shock - Will draw random vanc on 3/27 @ 0700.     Height: 5\' 7"  (170.2 cm) Weight: (!) 167.1 kg (368 lb 6.2 oz) IBW/kg (Calculated) : 61.6  Temp (24hrs), Avg:101 F (38.3 C), Min:99.3 F (37.4 C), Max:102.7 F (39.3 C)  Recent Labs  Lab 05/10/22 0315 05/10/22 0327 05/10/22 0532  WBC  --  4.4  --   CREATININE  --  1.94*  --   LATICACIDVEN 8.6*  --  >9.0*    Estimated Creatinine Clearance: 44.8 mL/min (A) (by C-G formula based on SCr of 1.94 mg/dL (H)).    No Known Allergies  Antimicrobials this admission:   >>    >>   Dose adjustments this admission:   Microbiology results:  BCx:   UCx:    Sputum:    MRSA PCR:   Thank you for allowing pharmacy to be a part of this patient's care.  Roel Douthat D 05/10/2022 7:29 AM

## 2022-05-10 NOTE — H&P (Addendum)
Kayla Reyes, MRN:  [id number], DOB:  [birth year], LOS: 0 ADMISSION DATE:  05/10/2022, CONSULTATION DATE:  05/10/22 REFERRING MD:  Dr. Beather Arbour, CHIEF COMPLAINT:  Unresponsive   History of Present Illness:  72 yo F presenting to [facility] ED from home via EMS on 05/10/22 for evaluation of altered mental status.  History provided by son bedside with assistance intermittently from the patient who remains lethargic. Family and patient report that she was in her normal state of health until late in the evening on 05/09/22 when she began having significant chills. Then she became less responsive prompting family to call EMS. Patient denies pain anywhere, fever, urinary symptoms, dyspnea, chest pain, nausea/ vomiting/ diarrhea. She is audibly wheezing and appears mildly dyspneic but both the patient and the son report this as baseline. They also endorse a chronic productive cough. She does not wear oxygen at home, and is bed-bound. She lives with family who assist her in her needs. She endorses a wound on her "backside" that her sister cleans. Her son bedside denies being told of any changes to this wound recently. She has a remote smoking history, having quit in 2008. She denies any ETOH use/ recreational drug use. She is being treated by rheumatology for RA. She has been taking all medication as prescribed including her warfarin.  ED course: Upon arrival patient febrile, tachypneic & tachycardic with labile blood pressures meeting sepsis criteria. Empiric antibiotics ordered as well as 2 L IVF bolus- patient's 30 mL/kg would be closer to 6 L but due to the patient's respiratory status, EDP chose to be conservative. Lab work significant for lactic acidosis, hypokalemia, AKI, NAGMA, mild transaminitis, elevated INR, mildly elevated troponin & BNP. BP continues to be marginal and PCCM consulted for admission due to concern for the need of vasopressor support.  Medications given: Tylenol, cefepime/vancomycin/  flagyl, 2 L LR bolus  Initial Vitals: 102.7, 30, 125, 104/88 & 92 on RA Significant labs: (Labs/ Imaging personally reviewed) I, Domingo Pulse [place], AGACNP-BC, personally viewed and interpreted this ECG. EKG Interpretation: Date: 05/10/22, EKG Time: 03:23, Rate: 125, Rhythm: ST, QRS Axis:  borderline RAD, Intervals: prolonged QTc, ST/T Wave abnormalities: non-specific T wave abnormalities, Narrative Interpretation: ST with prolonged Qtc and non-specific T wave abnormalities Chemistry: Na+: 138, K+: 3.2, BUN/Cr.: 18/ 1.94, Serum CO2/ AG: 19/11, AST: 49 Hematology: WBC: 4.4, Hgb: 15.2,  Troponin: 38, BNP: 103.2, Lactic/ PCT: 8.6/ pending, COVID-19 & Influenza A/B: pending  ABG: 7.4/ 27/ 71/ 16.7 CXR 05/10/22: generalized interstitial opacity favoring edema CT head wo contrast 05/10/22: no acute finding  PCCM consulted for admission due to severe sepsis secondary to unknown etiology requiring peripheral vasopressor support.  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  Interim History / Subjective:  Patient lethargic, responsive to voice with a RASS -2. Son bedside plan of care discussed, all questions answered at this time.  Objective   Blood pressure (!) 115/53, pulse (!) 115, temperature 99.3 F (37.4 C), temperature source Oral, resp. rate (!) 29, height 5\' 7"  (1.702 m), weight (!) 167.1 kg, SpO2 93 %.        Intake/Output Summary (Last 24 hours) at 05/10/2022 [phone]  Last data filed at 05/10/2022 0426 Gross per 24 hour  Intake 950.54 ml  Output --  Net 950.54 ml   Filed Weights   05/10/22 0322   Weight: (!) 167.1 kg    Examination: General: Adult female, acutely ill, lying in bed, NAD HEENT: MM pale/ dry, anicteric, atraumatic, neck supple Neuro: lethargic/ RASS: -2, A&O x 4, able to follow commands, PERRL +3, MAE CV: s1s2 RRR, ST on monitor, no r/m/g Pulm: Regular, mildly labored on 2 L [place], breath sounds inspiratory/ expiratory wheezes-BUL & diminished-BLL GI: soft, rounded, non tender, bs x 4 Skin: MASD in groin area,  no rashes/lesions noted Extremities: warm/dry, pulses + 2 R/P, trace edema noted BLE  Resolved Hospital Problem list     Assessment & Plan:  Suspected Severe Sepsis with shock due to unknown etiology Lactic: 8.6, Baseline PCT: pending, UA: +glucose + small Hgb, CXR: generalized interstitial opacity favoring edema  Initial interventions/workup included: 2 L of NS/LR & Cefepime/ Vancomycin/ Metronidazole - Supplemental oxygen as needed, to maintain SpO2 > 90% - f/u cultures, trend lactic/ PCT - Daily CBC, monitor WBC/ fever curve - broad empiric IV antibiotics due to immunosuppressant use outpt: cefepime / vancomycin / flagyl - conservative IVF hydration due to respiratory status - Start vasopressors to maintain MAP< 65: norepinephrine  Acute Kidney Injury  NAGMA Mild Hypokalemia Baseline Cr: 0.7, Cr on admission: 1.94 - 2 amps of bicarb ordered, K+ supplemented > f/u Mg / Phos - Strict I/O's: alert provider if UOP < 0.5 mL/kg/hr - gentle IVF hydration  - Daily BMP, replace electrolytes PRN - Avoid nephrotoxic agents as able, ensure adequate renal perfusion  Acute Hypoxic Respiratory Failure Patient requiring acute oxygen, dyspneic with audible wheezes bedside that family and patient report as baseline - Supplemental O2 to maintain SpO2 > 90% - Intermittent chest x-ray & ABG PRN - Ensure adequate pulmonary hygiene  - F/u cultures, trend PCT - initiate solu medrol 40 mg daily - Duo nebs Q 6, bronchodilators PRN  Chronic Rheumatoid Arthritis -  continue daily Plaquenil - hold methotrexate due to suspected sepsis, hold gabapentin due to lethargy > consider restarting as patient stabilizes - prednisone changed to solu medrol as above  Chronic Anticoagulation HFpEF with acute exacerbation  Mildly Elevated Troponin s/t demand ischemia vs NSTEMI PMHx: PAF, Arterial Thrombus, HLD - initiate heparin per pharmacy protocol as patient is too lethargic to safely take PO - diurese with lasix as hemodynamics and renal fxn allow > hold for now - trend troponin to peak - consider echocardiogram depending on troponin trend  Hypothyroidism - f/u TSH - continue outpatient synthroid  Mild Transaminitis  - Trend hepatic function - Consider RUQ US/CT abdomen/pelvis - avoid hepatotoxic agents - hold outpatient lovastatin, consider restarting once LFT's normalize  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 - WOC consulted, appreciate input - Turn Q 2, utilize off loading devices - interdry to wick away moisture  At Risk for Steroid Induced Hyperglycemia Hemoglobin A1C: pending - Monitor CBG Q 4 hours - SSI moderate dosing - target range while in ICU: 140-180 - follow ICU hyper/hypo-glycemia protocol - outpatient Jardiance on hold  Best Practice (right click and "Reselect all SmartList Selections" daily)  Diet/type: NPO DVT prophylaxis: systemic heparin GI prophylaxis: PPI Lines: N/A Foley:  N/A Code Status:  full code Last date of multidisciplinary goals of care discussion [05/09/22]  Labs   CBC: Recent Labs  Lab 05/10/22 0327  WBC 4.4  NEUTROABS 3.5  HGB 15.2*  HCT 48.8*  MCV 97.0  PLT [id number]    Basic Metabolic Panel: Recent Labs  Lab 05/10/22 0327  NA 138  K 3.2*  CL 108  CO2 19*  GLUCOSE 159*  BUN 18  CREATININE 1.94*  CALCIUM 8.1*   GFR: Estimated Creatinine Clearance: 44.8 mL/min (A) (by C-G formula based on SCr of 1.94 mg/dL (H)). Recent Labs  Lab 05/10/22 0315 05/10/22 0327  WBC  --  4.4  LATICACIDVEN 8.6*   --     Liver Function Tests: Recent Labs  Lab 05/10/22 0327  AST 49*  ALT 19  ALKPHOS 29*  BILITOT 0.8  PROT 6.3*  ALBUMIN 3.0*   No results for input(s): "LIPASE", "AMYLASE" in the last 168 hours. No results for input(s): "AMMONIA" in the last 168 hours.  ABG    Component Value Date/Time   PHART 7.4 05/10/2022 0321   PCO2ART 27 (L) 05/10/2022 0321   PO2ART 71 (L) 05/10/2022 0321   HCO3 16.7 (L) 05/10/2022 0321   ACIDBASEDEF 6.6 (H) 05/10/2022 0321   O2SAT 95.8 05/10/2022 0321     Coagulation Profile: Recent Labs  Lab 05/10/22 0327  INR 2.8*    Cardiac Enzymes: No results for input(s): "CKTOTAL", "CKMB", "CKMBINDEX", "TROPONINI" in the last 168 hours.  HbA1C: Hgb A1c MFr Bld  Date/Time Value Ref Range Status  02/08/2018 06:20 AM 6.1 (H) 4.8 - 5.6 % Final    Comment:    (NOTE) Pre diabetes:          5.7%-6.4% Diabetes:              >6.4% Glycemic control for   <7.0% adults with diabetes     CBG: No results for input(s): "GLUCAP" in the last 168 hours.  Review of Systems: Positives in BOLD   Gen: Denies fever, chills, weight change, fatigue, night sweats HEENT: Denies blurred vision, double vision, hearing loss, tinnitus, sinus congestion, rhinorrhea, sore throat, neck stiffness, dysphagia PULM: Denies shortness of breath, cough, sputum production, hemoptysis, wheezing CV: Denies chest pain, edema, orthopnea, paroxysmal nocturnal dyspnea, palpitations GI: Denies abdominal pain, nausea, vomiting, diarrhea, hematochezia, melena, constipation, change in bowel habits GU: Denies dysuria, hematuria, polyuria, oliguria, urethral discharge Endocrine: Denies hot or cold intolerance, polyuria, polyphagia or appetite change Derm: Denies rash, dry skin, scaling or peeling skin change Heme: Denies easy bruising, bleeding, bleeding gums Neuro: Denies headache, numbness, weakness, slurred speech, loss of memory or consciousness  Past Medical History:  She,  has a  past medical history of Arterial thrombosis (HCC), HLD (hyperlipidemia), Hypertension, Hypothyroidism, Obesity, PAF (paroxysmal atrial fibrillation) ([place]), and Rheumatoid arthritis ([place]).   Surgical History:   Past Surgical History:  Procedure Laterality Date   BELOW KNEE LEG AMPUTATION       Social History:   reports that she has quit smoking. She has never used smokeless tobacco. She reports that she does not currently use alcohol. She reports that she does not currently use drugs.   Family History:  Her family history includes Arthritis in her mother; Cancer in her father; Hypertension in her mother; Thyroid disease in her father.   Allergies No Known Allergies   Home Medications  Prior to Admission medications   Medication Sig Start Date End Date Taking? Authorizing Provider  albuterol (PROVENTIL HFA;VENTOLIN HFA) 108 (90 Base) MCG/ACT inhaler Inhale 2 puffs into the lungs every 6 (six) hours as needed for wheezing or shortness of breath.    [provider]  fluticasone (FLONASE) 50 MCG/ACT nasal spray  Place 1 spray into both nostrils daily as needed for allergies or rhinitis.    [provider]  furosemide (LASIX) 20 MG tablet Take 1 tablet (20 mg total) by mouth daily. 02/09/18 10/08/19  Salary, Avel Peace, MD  gabapentin (NEURONTIN) 600 MG tablet Take 600 mg by mouth 3 (three) times daily.    [provider]  hydroxychloroquine (PLAQUENIL) 200 MG tablet TAKE 2 TABLETS(400 MG) BY MOUTH EVERY DAY 12/17/18   [provider]  levothyroxine (SYNTHROID, LEVOTHROID) 137 MCG tablet Take 137 mcg by mouth daily before breakfast.    [provider]  lovastatin (MEVACOR) 20 MG tablet Take 20 mg by mouth every evening.    [provider]  methotrexate (RHEUMATREX) 2.5 MG tablet Take 2.5 mg by mouth once a week.  12/25/18   [provider]  metoprolol tartrate (LOPRESSOR) 25 MG tablet Take 25 mg by mouth 2 (two) times daily.  01/10/19    [provider]  predniSONE (DELTASONE) 5 MG tablet Take 5 mg by mouth daily with breakfast.  12/25/18   [provider]  warfarin (COUMADIN) 10 MG tablet Take 10 mg by mouth daily.    [provider]     Critical care time: 67 minutes       Venetia Night, AGACNP-BC Acute Care Nurse Practitioner [facility]   [phone] / [phone] Please see Amion for pager details.

## 2022-05-10 NOTE — Progress Notes (Signed)
PHARMACY -  BRIEF ANTIBIOTIC NOTE   Pharmacy has received consult(s) for Vancomycin, Cefepime from an ED provider.  The patient's profile has been reviewed for ht/wt/allergies/indication/available labs.    One time order(s) placed for Vancomycin 2500 mg IV X 1 and Cefepime 2 gm IV X 1.   Further antibiotics/pharmacy consults should be ordered by admitting physician if indicated.                       Thank you, Nefi Musich D 05/10/2022  3:49 AM

## 2022-05-10 NOTE — Progress Notes (Signed)
Pt converted from afib to NSR @ 2042

## 2022-05-10 NOTE — Progress Notes (Signed)
eLink Physician-Brief Progress Note Patient Name: Kayla Reyes DOB: 01-14-1951 MRN: [id number]   Date of Service  05/10/2022  HPI/Events of Note  71/M with hx of arterial thrombosis on coumadin, HTN, HLD, hypothyroidism, PAF, obesity, RA on plaquenil,  brought in due to chills and decreased responsiveness. Pt with lactic acidosis, hypotension and febrile.  Pt started on Vanc and Cefepime.   Lactic acid 8.6 --> greater than 9 H&H 15.2/48.8 Crea 1.94  eICU Interventions  Continue empiric antibiotics.  Continue norepinephrine to keep MAP >65.  Follow up cultures.  Get procalcitonin.  Continue heparin gtt.  Continue stress dose steroids.  Insulin for glucose control.  Continue to trend lactate.  Protonix for GI prophylaxis.       Intervention Category Evaluation Type: New Patient Evaluation  Elsie Lincoln 05/10/2022, 6:31 AM

## 2022-05-11 DIAGNOSIS — I480 Paroxysmal atrial fibrillation: Secondary | ICD-10-CM

## 2022-05-11 DIAGNOSIS — R6521 Severe sepsis with septic shock: Secondary | ICD-10-CM | POA: Diagnosis not present

## 2022-05-11 DIAGNOSIS — A419 Sepsis, unspecified organism: Secondary | ICD-10-CM | POA: Diagnosis not present

## 2022-05-11 LAB — COMPREHENSIVE METABOLIC PANEL
ALT: 27 U/L (ref 0–44)
AST: 73 U/L — ABNORMAL HIGH (ref 15–41)
Albumin: 2.7 g/dL — ABNORMAL LOW (ref 3.5–5.0)
Alkaline Phosphatase: 29 U/L — ABNORMAL LOW (ref 38–126)
Anion gap: 11 (ref 5–15)
BUN: 24 mg/dL — ABNORMAL HIGH (ref 8–23)
CO2: 21 mmol/L — ABNORMAL LOW (ref 22–32)
Calcium: 7.9 mg/dL — ABNORMAL LOW (ref 8.9–10.3)
Chloride: 105 mmol/L (ref 98–111)
Creatinine, Ser: 1.32 mg/dL — ABNORMAL HIGH (ref 0.44–1.00)
GFR, Estimated: 44 mL/min — ABNORMAL LOW (ref >60)
Glucose, Bld: 167 mg/dL — ABNORMAL HIGH (ref 70–99)
Potassium: 3.6 mmol/L (ref 3.5–5.1)
Sodium: 137 mmol/L (ref 135–145)
Total Bilirubin: 0.8 mg/dL (ref 0.3–1.2)
Total Protein: 5.7 g/dL — ABNORMAL LOW (ref 6.5–8.1)

## 2022-05-11 LAB — CBC
HCT: 42.2 % (ref 36.0–46.0)
Hemoglobin: 13.9 g/dL (ref 12.0–15.0)
MCH: 30.5 pg (ref 26.0–34.0)
MCHC: 32.9 g/dL (ref 30.0–36.0)
MCV: 92.5 fL (ref 80.0–100.0)
Platelets: 159 10*3/uL (ref 150–400)
RBC: 4.56 MIL/uL (ref 3.87–5.11)
RDW: 17.2 % — ABNORMAL HIGH (ref 11.5–15.5)
WBC: 12 10*3/uL — ABNORMAL HIGH (ref 4.0–10.5)
nRBC: 0.2 % (ref 0.0–0.2)

## 2022-05-11 LAB — ECHOCARDIOGRAM COMPLETE
Area-P 1/2: 4.49 cm2
Height: 67 in
S' Lateral: 3 cm
Weight: 5894.22 oz

## 2022-05-11 LAB — BASIC METABOLIC PANEL
Anion gap: 11 (ref 5–15)
Anion gap: 8 (ref 5–15)
BUN: 22 mg/dL (ref 8–23)
BUN: 21 mg/dL (ref 8–23)
CO2: 21 mmol/L — ABNORMAL LOW (ref 22–32)
CO2: 24 mmol/L (ref 22–32)
Calcium: 7.4 mg/dL — ABNORMAL LOW (ref 8.9–10.3)
Calcium: 7.3 mg/dL — ABNORMAL LOW (ref 8.9–10.3)
Chloride: 106 mmol/L (ref 98–111)
Chloride: 103 mmol/L (ref 98–111)
Creatinine, Ser: 1.17 mg/dL — ABNORMAL HIGH (ref 0.44–1.00)
Creatinine, Ser: 1.11 mg/dL — ABNORMAL HIGH (ref 0.44–1.00)
GFR, Estimated: 51 mL/min — ABNORMAL LOW (ref >60)
GFR, Estimated: 54 mL/min — ABNORMAL LOW (ref >60)
Glucose, Bld: 142 mg/dL — ABNORMAL HIGH (ref 70–99)
Glucose, Bld: 152 mg/dL — ABNORMAL HIGH (ref 70–99)
Potassium: 3.4 mmol/L — ABNORMAL LOW (ref 3.5–5.1)
Potassium: 3.9 mmol/L (ref 3.5–5.1)
Sodium: 138 mmol/L (ref 135–145)
Sodium: 135 mmol/L (ref 135–145)

## 2022-05-11 LAB — BLOOD GAS, ARTERIAL
Acid-Base Excess: 1.1 mmol/L (ref 0.0–2.0)
Bicarbonate: 25.1 mmol/L (ref 20.0–28.0)
O2 Content: 2 L/min
O2 Saturation: 98.4 %
Patient temperature: 37
pCO2 arterial: 37 mmHg (ref 32–48)
pH, Arterial: 7.44 (ref 7.35–7.45)
pO2, Arterial: 86 mmHg (ref 83–108)

## 2022-05-11 LAB — MAGNESIUM
Magnesium: 2.1 mg/dL (ref 1.7–2.4)
Magnesium: 2.1 mg/dL (ref 1.7–2.4)

## 2022-05-11 LAB — HEMOGLOBIN A1C
Hgb A1c MFr Bld: 6.5 % — ABNORMAL HIGH (ref 4.8–5.6)
Mean Plasma Glucose: 140 mg/dL

## 2022-05-11 LAB — GLUCOSE, CAPILLARY
Glucose-Capillary: 121 mg/dL — ABNORMAL HIGH (ref 70–99)
Glucose-Capillary: 134 mg/dL — ABNORMAL HIGH (ref 70–99)
Glucose-Capillary: 145 mg/dL — ABNORMAL HIGH (ref 70–99)
Glucose-Capillary: 146 mg/dL — ABNORMAL HIGH (ref 70–99)
Glucose-Capillary: 147 mg/dL — ABNORMAL HIGH (ref 70–99)
Glucose-Capillary: 151 mg/dL — ABNORMAL HIGH (ref 70–99)
Glucose-Capillary: 165 mg/dL — ABNORMAL HIGH (ref 70–99)
Glucose-Capillary: 98 mg/dL (ref 70–99)

## 2022-05-11 LAB — CK: Total CK: 2587 U/L — ABNORMAL HIGH (ref 38–234)

## 2022-05-11 LAB — VANCOMYCIN, RANDOM: Vancomycin Rm: 8 ug/mL

## 2022-05-11 LAB — PHOSPHORUS
Phosphorus: 4 mg/dL (ref 2.5–4.6)
Phosphorus: 3.4 mg/dL (ref 2.5–4.6)

## 2022-05-11 LAB — HEPARIN LEVEL (UNFRACTIONATED): Heparin Unfractionated: 0.36 IU/mL (ref 0.30–0.70)

## 2022-05-11 LAB — LACTIC ACID, PLASMA: Lactic Acid, Venous: 4.1 mmol/L (ref 0.5–1.9)

## 2022-05-11 LAB — PROCALCITONIN: Procalcitonin: 68.27 ng/mL

## 2022-05-11 MED ORDER — VITAL 1.5 CAL PO LIQD
1000.0000 mL | ORAL | Status: DC
  Administered 2022-05-11: 1000 mL

## 2022-05-11 MED ORDER — METOPROLOL TARTRATE 5 MG/5ML IV SOLN
2.5000 mg | Freq: Once | INTRAVENOUS | Status: AC
  Administered 2022-05-11: 2.5 mg via INTRAVENOUS

## 2022-05-11 MED ORDER — LACTATED RINGERS IV BOLUS
500.0000 mL | Freq: Once | INTRAVENOUS | Status: AC
  Administered 2022-05-11: 500 mL via INTRAVENOUS

## 2022-05-11 MED ORDER — POTASSIUM CHLORIDE 20 MEQ PO PACK
40.0000 meq | PACK | Freq: Once | ORAL | Status: AC
  Administered 2022-05-11: 40 meq via ORAL
  Filled 2022-05-11: qty 2

## 2022-05-11 MED ORDER — VANCOMYCIN HCL 1500 MG/300ML IV SOLN
1500.0000 mg | INTRAVENOUS | Status: DC
  Administered 2022-05-11: 1500 mg via INTRAVENOUS
  Filled 2022-05-11 (×2): qty 300

## 2022-05-11 MED ORDER — FREE WATER
30.0000 mL | Status: DC
  Administered 2022-05-11 – 2022-05-15 (×21): 30 mL

## 2022-05-11 MED ORDER — VITAMIN C 500 MG PO TABS
500.0000 mg | ORAL_TABLET | Freq: Two times a day (BID) | ORAL | Status: DC
  Administered 2022-05-12 – 2022-05-17 (×11): 500 mg
  Filled 2022-05-11 (×11): qty 1

## 2022-05-11 MED ORDER — AMIODARONE IV BOLUS ONLY 150 MG/100ML
150.0000 mg | Freq: Once | INTRAVENOUS | Status: AC
  Administered 2022-05-11: 150 mg via INTRAVENOUS

## 2022-05-11 MED ORDER — LEVOTHYROXINE SODIUM 137 MCG PO TABS
137.0000 ug | ORAL_TABLET | Freq: Every day | ORAL | Status: DC
  Administered 2022-05-13 – 2022-05-17 (×5): 137 ug
  Filled 2022-05-11 (×6): qty 1

## 2022-05-11 MED ORDER — POLYETHYLENE GLYCOL 3350 17 G PO PACK
17.0000 g | PACK | Freq: Every day | ORAL | Status: DC
  Administered 2022-05-11 – 2022-05-17 (×5): 17 g
  Filled 2022-05-11 (×6): qty 1

## 2022-05-11 MED ORDER — METOPROLOL TARTRATE 5 MG/5ML IV SOLN
2.5000 mg | INTRAVENOUS | Status: AC | PRN
  Administered 2022-05-11 (×2): 2.5 mg via INTRAVENOUS
  Filled 2022-05-11: qty 5

## 2022-05-11 MED ORDER — IPRATROPIUM BROMIDE 0.02 % IN SOLN
0.5000 mg | Freq: Four times a day (QID) | RESPIRATORY_TRACT | Status: DC
  Administered 2022-05-11 – 2022-05-13 (×9): 0.5 mg via RESPIRATORY_TRACT
  Filled 2022-05-11 (×9): qty 2.5

## 2022-05-11 MED ORDER — SODIUM CHLORIDE 0.9 % IV SOLN
1.0000 g | Freq: Three times a day (TID) | INTRAVENOUS | Status: DC
  Administered 2022-05-11 – 2022-05-12 (×2): 1 g via INTRAVENOUS
  Filled 2022-05-11 (×3): qty 20

## 2022-05-11 MED ORDER — LEVALBUTEROL HCL 0.63 MG/3ML IN NEBU
0.6300 mg | INHALATION_SOLUTION | Freq: Four times a day (QID) | RESPIRATORY_TRACT | Status: DC
  Administered 2022-05-11 – 2022-05-13 (×9): 0.63 mg via RESPIRATORY_TRACT
  Filled 2022-05-11 (×9): qty 3

## 2022-05-11 MED ORDER — METOPROLOL TARTRATE 5 MG/5ML IV SOLN
INTRAVENOUS | Status: AC
  Filled 2022-05-11: qty 5

## 2022-05-11 MED ORDER — AMIODARONE HCL IN DEXTROSE 360-4.14 MG/200ML-% IV SOLN
60.0000 mg/h | INTRAVENOUS | Status: DC
  Administered 2022-05-11 – 2022-05-15 (×18): 60 mg/h via INTRAVENOUS
  Administered 2022-05-15: 30 mg/h via INTRAVENOUS
  Administered 2022-05-16 – 2022-05-17 (×5): 60 mg/h via INTRAVENOUS
  Filled 2022-05-11 (×5): qty 200
  Filled 2022-05-11: qty 400
  Filled 2022-05-11 (×17): qty 200

## 2022-05-11 MED ORDER — DOCUSATE SODIUM 50 MG/5ML PO LIQD
100.0000 mg | Freq: Two times a day (BID) | ORAL | Status: DC
  Administered 2022-05-11 – 2022-05-17 (×9): 100 mg
  Filled 2022-05-11 (×10): qty 10

## 2022-05-11 MED ORDER — METOCLOPRAMIDE HCL 5 MG/ML IJ SOLN
10.0000 mg | Freq: Three times a day (TID) | INTRAMUSCULAR | Status: DC
  Administered 2022-05-11 – 2022-05-17 (×18): 10 mg via INTRAVENOUS
  Filled 2022-05-11 (×18): qty 2

## 2022-05-11 NOTE — Progress Notes (Signed)
NAMELeighann Reyes, MRN:  [id number], DOB:  [birth year], LOS: 1 ADMISSION DATE:  05/10/2022, CONSULTATION DATE:  05/10/22 REFERRING MD:  Dr. Beather Arbour, CHIEF COMPLAINT:  Unresponsive   History of Present Illness:  72 yo F presenting to [facility] ED from home via EMS on 05/10/22 for evaluation of altered mental status.  History provided by son bedside with assistance intermittently from the patient who remains lethargic. Family and patient report that she was in her normal state of health until late in the evening on 05/09/22 when she began having significant chills. Then she became less responsive prompting family to call EMS. Patient denies pain anywhere, fever, urinary symptoms, dyspnea, chest pain, nausea/ vomiting/ diarrhea. She is audibly wheezing and appears mildly dyspneic but both the patient and the son report this as baseline. They also endorse a chronic productive cough. She does not wear oxygen at home, and is bed-bound. She lives with family who assist her in her needs. She endorses a wound on her "backside" that her sister cleans. Her son bedside denies being told of any changes to this wound recently. She has a remote smoking history, having quit in 2008. She denies any ETOH use/ recreational drug use. She is being treated by rheumatology for RA. She has been taking all medication as prescribed including her warfarin.  ED course: Upon arrival patient febrile, tachypneic & tachycardic with labile blood pressures meeting sepsis criteria. Empiric antibiotics ordered as well as 2 L IVF bolus- patient's 30 mL/kg would be closer to 6 L but due to the patient's respiratory status, EDP chose to be conservative. Lab work significant for lactic acidosis, hypokalemia, AKI, NAGMA, mild transaminitis, elevated INR, mildly elevated troponin & BNP. BP continues to be marginal and PCCM consulted for admission due to concern for the need of vasopressor support.  Medications given: Tylenol, cefepime/vancomycin/  flagyl, 2 L LR bolus  Initial Vitals: 102.7, 30, 125, 104/88 & 92 on RA EKG Interpretation: Date: 05/10/22, EKG Time: 03:23, Rate: 125, Rhythm: ST, QRS Axis:  borderline RAD, Intervals: prolonged QTc, ST/T Wave abnormalities: non-specific T wave abnormalities, Narrative Interpretation: ST with prolonged Qtc and non-specific T wave abnormalities Chemistry: Na+: 138, K+: 3.2, BUN/Cr.: 18/ 1.94, Serum CO2/ AG: 19/11, AST: 49 Hematology: WBC: 4.4, Hgb: 15.2,  Troponin: 38, BNP: 103.2, Lactic/ PCT: 8.6/ pending, COVID-19 & Influenza A/B: pending  ABG: 7.4/ 27/ 71/ 16.7 CXR 05/10/22: generalized interstitial opacity favoring edema CT head wo contrast 05/10/22: no acute finding  PCCM consulted for admission due to severe sepsis secondary to unknown etiology requiring peripheral vasopressor support.  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 3/27 remains on pressors  Interim History / Subjective:  Remains on pressors More awake  Objective   Blood pressure (!) 92/58, pulse 97, temperature 98.6 F (37 C), temperature source Oral, resp. rate (!) 24, height 5\' 7"  (1.702 m), weight (!) 167.1 kg, SpO2 98 %.        Intake/Output Summary (Last 24 hours) at 05/11/2022 0713 Last data filed at 05/11/2022 [id number] Gross per 24 hour  Intake 5625.87 ml  Output 1972 ml  Net 3653.87 ml    Filed Weights   05/10/22  [id number]  Weight: (!) 167.1 kg      Review of Systems: Gen:  Denies  fever, sweats, chills weight loss  HEENT: Denies blurred vision, double vision, ear pain, eye pain, hearing loss, nose bleeds, sore  throat Cardiac:  No dizziness, chest pain or heaviness, chest tightness,edema, No JVD Resp:   No cough, -sputum production, -shortness of breath,-wheezing, -hemoptysis,  Other:  All other systems negative   Physical Examination:   General Appearance: No distress  EYES PERRLA, EOM intact.   NECK Supple, No JVD Pulmonary: normal breath sounds, No wheezing.  CardiovascularNormal S1,S2.  No m/r/g.   Abdomen: Benign, Soft, non-tender. Neurology UE/LE 5/5 strength, no focal deficitsA&O x 4 Ext pulses intact, cap refill intact ALL OTHER ROS ARE NEGATIVE      Assessment & Plan:  Severe Sepsis with shock due infected PANUS Lactic: 8.6, Baseline PCT: pending, UA: +glucose + small Hgb, CXR: generalized interstitial opacity favoring edema  Initial interventions/workup included: 2 L of NS/LR & Cefepime/ Vancomycin/ Metronidazole - Supplemental oxygen as needed, to maintain SpO2 > 90% - f/u cultures, trend lactic/ PCT - broad empiric IV antibiotics due to immunosuppressant use outpt: cefepime / vancomycin / flagyl/Fluconazole - conservative IVF hydration due to respiratory status  vasopressors to maintain MAP< 65: norepinephrine  ACUTE KIDNEY INJURY/Renal Failure -continue Foley Catheter-assess need -Avoid nephrotoxic agents -Follow urine output, BMP -Ensure adequate renal perfusion, optimize oxygenation -Renal dose medications   Intake/Output Summary (Last 24 hours) at 05/11/2022 0715 Last data filed at 05/11/2022 [id number] Gross per 24 hour  Intake 5625.87 ml  Output 1972 ml  Net 3653.87 ml      Latest Ref Rng & Units 05/10/2022    9:46 PM 05/10/2022    9:22 PM 05/10/2022    2:37 PM  BMP  Glucose 70 - 99 mg/dL 183  167  174   BUN 8 - 23 mg/dL 23  24  23    Creatinine 0.44 - 1.00 mg/dL 1.38  1.32  1.71   Sodium 135 - 145 mmol/L 134  137  133   Potassium 3.5 - 5.1 mmol/L 3.6  3.6  3.7   Chloride 98 - 111 mmol/L 106  105  103   CO2 22 - 32 mmol/L 18  21  15    Calcium 8.9 - 10.3  mg/dL 7.5  7.9  7.8      Chronic Rheumatoid Arthritis - continue daily Plaquenil - hold methotrexate due to suspected sepsis, hold gabapentin due to lethargy > consider restarting as patient stabilizes - prednisone changed to solu medrol as above  Chronic Anticoagulation HFpEF with acute exacerbation  Mildly Elevated Troponin s/t demand ischemia vs NSTEMI PMHx: PAF, Arterial Thrombus, HLD - initiate heparin per pharmacy protocol as patient is too lethargic to safely take PO - diurese with lasix as hemodynamics and renal fxn allow > hold for now - trend troponin to peak - consider echocardiogram depending on troponin trend    ENDO - ICU hypoglycemic\Hyperglycemia protocol -check FSBS per protocol   GI GI PROPHYLAXIS as indicated  NUTRITIONAL STATUS DIET-->as tolerated Constipation protocol as indicated   ELECTROLYTES -follow labs as needed -replace as needed -pharmacy consultation and following   Best Practice (right click and "Reselect all SmartList Selections" daily)  Diet/type: NPO DVT prophylaxis: systemic heparin GI prophylaxis: PPI Lines: N/A Foley:  N/A Code Status:  full code   Labs   CBC: Recent Labs  Lab 05/10/22 0327 05/10/22 2122  WBC 4.4 12.0*  NEUTROABS 3.5  --  HGB 15.2* 13.9  HCT 48.8* 42.2  MCV 97.0 92.5  PLT 217 159     Basic Metabolic Panel: Recent Labs  Lab 05/10/22 0327 05/10/22 0532 05/10/22 1437 05/10/22 2122 05/10/22 2146  NA 138  --  133* 137 134*  K 3.2*  --  3.7 3.6 3.6  CL 108  --  103 105 106  CO2 19*  --  15* 21* 18*  GLUCOSE 159*  --  174* 167* 183*  BUN 18  --  23 24* 23  CREATININE 1.94*  --  1.71* 1.32* 1.38*  CALCIUM 8.1*  --  7.8* 7.9* 7.5*  MG  --  1.6* 1.8 2.1  --   PHOS  --  3.1 4.0 4.0  --     GFR: Estimated Creatinine Clearance: 63 mL/min (A) (by C-G formula based on SCr of 1.38 mg/dL (H)). Recent Labs  Lab 05/10/22 0327 05/10/22 0532 05/10/22 0932 05/10/22 1127 05/10/22 1437  05/10/22 2122 05/11/22 0410  PROCALCITON  --  78.45  --   --   --  68.27  --   WBC 4.4  --   --   --   --  12.0*  --   LATICACIDVEN  --  >9.0*   < > 7.5* 6.2* 4.6* 4.1*   < > = values in this interval not displayed.     Liver Function Tests: Recent Labs  Lab 05/10/22 0327 05/10/22 1437 05/10/22 2122  AST 49*  --  73*  ALT 19  --  27  ALKPHOS 29*  --  29*  BILITOT 0.8  --  0.8  PROT 6.3*  --  5.7*  ALBUMIN 3.0* 2.6* 2.7*    No results for input(s): "LIPASE", "AMYLASE" in the last 168 hours. No results for input(s): "AMMONIA" in the last 168 hours.  ABG    Component Value Date/Time   PHART 7.32 (L) 05/10/2022 1353   PCO2ART 30 (L) 05/10/2022 1353   PO2ART 78 (L) 05/10/2022 1353   HCO3 32.0 (H) 05/10/2022 2048   ACIDBASEDEF 9.3 (H) 05/10/2022 1353   O2SAT 89.4 05/10/2022 2048     Coagulation Profile: Recent Labs  Lab 05/10/22 0327 05/10/22 2146  INR 2.8* 3.7*     Cardiac Enzymes: Recent Labs  Lab 05/11/22 0410  CKTOTAL 2,587*    HbA1C: Hgb A1c MFr Bld  Date/Time Value Ref Range Status  05/10/2022 05:32 AM 6.5 (H) 4.8 - 5.6 % Final    Comment:    (NOTE)         Prediabetes: 5.7 - 6.4         Diabetes: >6.4         Glycemic control for adults with diabetes: <7.0   02/08/2018 06:20 AM 6.1 (H) 4.8 - 5.6 % Final    Comment:    (NOTE) Pre diabetes:          5.7%-6.4% Diabetes:              >6.4% Glycemic control for   <7.0% adults with diabetes     CBG: Recent Labs  Lab 05/10/22 1558 05/10/22 1942 05/10/22 2359 05/11/22 0301 05/11/22 0412  GLUCAP 126* 168* 98 165* 147*     DVT/GI PRX  assessed I Assessed the need for Labs I Assessed the need for Foley I Assessed the need for Central Venous Line Family Discussion when available I Assessed the need for Mobilization I made an Assessment of medications to be adjusted accordingly Safety Risk assessment completed  CASE DISCUSSED IN MULTIDISCIPLINARY ROUNDS WITH ICU TEAM    Critical  Care Time devoted to patient care services described in this note is 55 minutes.  Critical care was necessary to treat /prevent imminent and life-threatening deterioration.  PROGNOSIS IS POOR, SUPER MORBID OBESITY MULTIPLE MEDICAL ISSUES   Zac Torti Patricia Pesa, M.D.  [facility]  Medical Director [place] Director [facility] Cardio-Pulmonary Department

## 2022-05-11 NOTE — Consult Note (Signed)
PHARMACY CONSULT NOTE - FOLLOW UP  Pharmacy Consult for Electrolyte Monitoring and Replacement   Recent Labs: Potassium (mmol/L)  Date Value  05/10/2022 3.6   Magnesium (mg/dL)  Date Value  05/10/2022 2.1   Calcium (mg/dL)  Date Value  05/10/2022 7.5 (L)   Albumin (g/dL)  Date Value  05/10/2022 2.7 (L)  02/19/2018 3.1 (L)   Phosphorus (mg/dL)  Date Value  05/10/2022 4.0   Sodium (mmol/L)  Date Value  05/10/2022 134 (L)  02/19/2018 137     Assessment: 72 yo F presenting to [facility] ED from home via EMS on 05/10/22 for evaluation of altered mental status. Family and patient report that she was in her normal state of health until late in the evening on 05/09/22 when she began having significant chills. Then she became less responsive prompting family to call EMS. Pharmacy has been consulted to monitor and replace electrolytes while under PCCM care.  Goal of Therapy:  Electrolytes WNL  Plan:  No electrolyte replacement indicated at this time Recheck electrolytes with AM labs   Pearla Dubonnet ,PharmD Clinical Pharmacist 05/11/2022 7:34 AM

## 2022-05-11 NOTE — Consult Note (Addendum)
Cardiology Consultation   Patient ID: Kayla Reyes MRN: [id number]; DOB: 10/02/1950  Admit date: 05/10/2022 Date of Consult: 05/11/2022  PCP:  Marguerita Merles, [place] [facility] Cardiologist:  Kathlyn Sacramento, MD        Patient Profile:   Kayla Reyes is a 72 y.o. 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 Mortimer Fries.  History of Present Illness:   Ms. Kayla Reyes is a 72 year old female with previously mentioned past medical history.  She had previously had an echocardiogram done in December 2019 which showed an EF of 55 to 60% and had been doing rather well when she was last seen in clinic 10/08/2019.  She presented to the [facility] emergency department on 05/10/2022 as EMS was called out by her family for unresponsiveness.  Per EMS patient is oriented to person and event only which is different from her baseline.  There was a strong smell of urine on the patient but the patient voiced no complaints.  In the emergency department she was found to be arousable to voice.  The family was at the bedside and and stated that she was typically alert and oriented.  She continued to deny chest pain, shortness breath, abdominal pain nausea or vomiting.  But continued to have a strong smell of urine.  She had audible wheezing and appeared mildly dyspneic but both the patient and her son reported that that was her baseline.  She also endorsed a chronic productive cough.  Patient stated that due to her BKA that she is bedbound but she does  not require oxygen at home.  She also endorsed a wound to her backside but denied any changes to the wound recently.  She had been following with rheumatology and been treated for RA.  The patient and her family also stated she been taking all of her medications as prescribed including blood thinners.  She was noted to be febrile, tachypneic, and tachycardic with labile blood pressures and meeting sepsis criteria.  She was started on empiric antibiotics as well as given 2 L of IV fluid.  Patient was admitted to ICU you to be managed by PCCM with concern for the need of vasopressor support and severe sepsis.  Initial vital signs: Blood pressure 104/88, heart rate of 125, respirations of 30, temperature of 102.7, O2 saturations of 92% on room air  Pertinent labs: Sodium of 138, potassium 3.2, BUN/creatinine 18/1.94, CO2 19, AST 49, WBCs of 4.4, hemoglobin 15.2, high-sensitivity troponin of 38, BNP 103.2, lactic acid 8.6, respiratory panel negative  Imaging: Chest x-ray revealed generalized interstitial opacities favoring edema; CT of the head without contrast showed no acute finding  Medications administered in the emergency department: 2 L LR bolus, Tylenol, cefepime, vancomycin, and Flagyl  Cardiology was consulted this morning and for atrial fibrillation RVR at the request of PCCM.  Patient had bottomed out her blood pressure when given metoprolol, was currently on 2 vasopressors, had been started on an amiodarone drip last evening.   Past Medical History:  Diagnosis Date   Arterial thrombosis ([place])    a. s/p thromboectomy and  thrombolysis with left BKA in 2008 on chronic Coumadin    HLD (hyperlipidemia)    Hypertension    Hypothyroidism    Obesity    PAF (paroxysmal atrial fibrillation) ([place])    a. noted 12/19 in the setting of hypokalemia; b. CHADS2VASc 4 (HTN, age x 1, vascular disease, female); c. on chronic Coumadin in the setting of prior arterial clot   Rheumatoid arthritis ([place])      Past Surgical History:  Procedure Laterality Date   BELOW KNEE LEG AMPUTATION       Home Medications:  Prior to Admission medications   Medication Sig Start Date End Date Taking? Authorizing Provider  albuterol (PROVENTIL HFA;VENTOLIN HFA) 108 (90 Base) MCG/ACT inhaler Inhale 2 puffs into the lungs every 6 (six) hours as needed for wheezing or shortness of breath.   Yes [provider]  fluticasone (FLONASE) 50 MCG/ACT nasal spray Place 1 spray into both nostrils daily as needed for allergies or rhinitis.   Yes [provider]  furosemide (LASIX) 40 MG tablet Take 40 mg by mouth daily. 12/28/21  Yes [provider]  gabapentin (NEURONTIN) 600 MG tablet Take 600 mg by mouth 3 (three) times daily.   Yes [provider]  hydroxychloroquine (PLAQUENIL) 200 MG tablet TAKE 2 TABLETS(400 MG) BY MOUTH EVERY DAY 12/17/18  Yes [provider]  levothyroxine (SYNTHROID) 150 MCG tablet Take 150 mcg by mouth daily. 05/04/22  Yes [provider]  lovastatin (MEVACOR) 20 MG tablet Take 20 mg by mouth every evening.   Yes [provider]  methotrexate (RHEUMATREX) 2.5 MG tablet Take 2.5 mg by mouth once a week.  12/25/18  Yes [provider]  metoprolol tartrate (LOPRESSOR) 25 MG tablet Take 25 mg by mouth 2 (two) times daily.  01/10/19  Yes [provider]  potassium chloride (KLOR-CON M) 10 MEQ tablet Take 10 mEq by mouth daily. 03/08/22  Yes [provider]  predniSONE (DELTASONE) 1 MG tablet Take by mouth.   Yes [provider]  TRADJENTA 5 MG TABS tablet Take 5 mg by mouth daily. 05/04/22  Yes [provider]  warfarin (COUMADIN) 10 MG tablet Take 10 mg by mouth daily.   Yes [provider]  furosemide (LASIX) 20 MG tablet Take 1 tablet (20 mg total) by mouth daily. 02/09/18 10/08/19  Salary, Avel Peace, MD  JARDIANCE 10 MG TABS tablet Take 10 mg by mouth daily.    [provider]   levothyroxine (SYNTHROID, LEVOTHROID) 137 MCG tablet Take 137 mcg by mouth daily before breakfast. Patient not taking: Reported on 05/10/2022    [provider]  predniSONE (DELTASONE) 5 MG tablet Take 5 mg by mouth daily with breakfast.  Patient not taking: Reported on 05/10/2022 12/25/18   [provider]    Inpatient Medications: Scheduled Meds:  Chlorhexidine Gluconate Cloth  6 each Topical Q0600   hydrocortisone sod succinate (SOLU-CORTEF) inj  100 mg Intravenous Daily   insulin aspart  0-15 Units Subcutaneous Q4H   ipratropium-albuterol  3 mL Nebulization Q6H   leptospermum manuka honey  1 Application Topical Daily   levothyroxine  137 mcg Oral QAC breakfast   lidocaine  5 mL Intradermal Once   liver oil-zinc oxide   Topical TID   metoCLOPramide (REGLAN) injection  10 mg Intravenous Q8H   mupirocin ointment  1 Application Nasal BID   pantoprazole (PROTONIX) IV  40 mg Intravenous Daily   sodium chloride flush  10-40 mL Intracatheter Q12H  thiamine (VITAMIN B1) injection  100 mg Intravenous Daily   vancomycin variable dose per unstable renal function (pharmacist dosing)   Does not apply See admin instructions   Continuous Infusions:  sodium chloride Stopped (05/10/22 1357)   amiodarone 60 mg/hr (05/11/22 1127)   fluconazole (DIFLUCAN) IV     heparin 2,000 Units/hr (05/11/22 1100)   meropenem (MERREM) IV Stopped (05/11/22 0800)   norepinephrine (LEVOPHED) Adult infusion 12 mcg/min (05/11/22 1100)   sodium bicarbonate 150 mEq in sterile water 1,150 mL infusion 75 mL/hr at 05/11/22 1100   vasopressin 0.03 Units/min (05/11/22 1100)   PRN Meds: acetaminophen **OR** acetaminophen, albuterol, docusate sodium, ondansetron (ZOFRAN) IV, polyethylene glycol, sodium chloride flush  Allergies:   No Known Allergies  Social History:   Social History   Socioeconomic History   Marital status: Single    Spouse name: Not on file   Number of children: Not on file    Years of education: Not on file   Highest education level: Not on file  Occupational History   Not on file  Tobacco Use   Smoking status: Former   Smokeless tobacco: Never  Substance and Sexual Activity   Alcohol use: Not Currently   Drug use: Not Currently   Sexual activity: Not on file  Other Topics Concern   Not on file  Social History Narrative   Not on file   Social Determinants of Health   Financial Resource Strain: Not on file  Food Insecurity: Not on file  Transportation Needs: Not on file  Physical Activity: Not on file  Stress: Not on file  Social Connections: Not on file  Intimate Partner Violence: Not on file    Family History:    Family History  Problem Relation Age of Onset   Arthritis Mother    Hypertension Mother    Cancer Father    Thyroid disease Father      ROS:  Please see the history of present illness.  Review of Systems  Constitutional:  Positive for malaise/fatigue.  Respiratory:  Positive for cough, sputum production and wheezing.   Neurological:  Positive for weakness.    All other ROS reviewed and negative.     Physical Exam/Data:   Vitals:   05/11/22 0817 05/11/22 0900 05/11/22 1000 05/11/22 1100  BP:  136/65 (!) 152/100   Pulse:    (!) 154  Resp:  (!) 22 (!) 21 (!) 23  Temp:      TempSrc:      SpO2: 95% 95% 97% 99%  Weight:      Height:        Intake/Output Summary (Last 24 hours) at 05/11/2022 1150 Last data filed at 05/11/2022 1100 Gross per 24 hour  Intake 5187.79 ml  Output 1972 ml  Net 3215.79 ml      05/11/2022    7:09 AM 05/10/2022    3:22 AM 02/19/2018    1:24 PM  Last 3 Weights  Weight (lbs) 380 lb 4.7 oz 368 lb 6.2 oz 399 lb  Weight (kg) 172.5 kg 167.1 kg 180.985 kg     Body mass index is 59.56 kg/m.  General:  Ill appearing, fatigued HEENT: MM pale and dry, NGT intact to suction Neck: unable yo determine JVD secondary to body habitus Vascular: No carotid bruits; Distal pulses 2+ bilaterally Cardiac:   normal S1, S2; IRIR; no murmur  Lungs: diminished to auscultation bilaterally, currently maintaining oxygen saturations on 2L of O2 via [place] Abd: soft, nontender, obese, no  hepatomegaly  Ext: L BKA Musculoskeletal:  No deformities, BUE and BLE strength normal and equal Skin: warm and dry,  reported wound to her sacrum(unable to turn pt at this time to assess) Neuro:  Oriented to person Psych:  flat affect   EKG:  The EKG was personally reviewed and demonstrates: Sinus rhythm with a rate of 96 with likely right ventricular hypertrophy Telemetry:  Telemetry was personally reviewed and demonstrates: Atrial fibrillation with RVR rates of 90-150 prior to converting back into atrial fibrillation she did have a 9 beat run of nonsustained V. tach  Relevant CV Studies: Echocardiogram pending  Laboratory Data:  High Sensitivity Troponin:   Recent Labs  Lab 05/10/22 0327 05/10/22 0532 05/10/22 1930  TROPONINIHS 38* 51* 63*     Chemistry Recent Labs  Lab 05/10/22 0532 05/10/22 1437 05/10/22 2122 05/10/22 2146 05/11/22 1042  NA  --  133* 137 134* 138  K  --  3.7 3.6 3.6 3.4*  CL  --  103 105 106 106  CO2  --  15* 21* 18* 21*  GLUCOSE  --  174* 167* 183* 142*  BUN  --  23 24* 23 22  CREATININE  --  1.71* 1.32* 1.38* 1.17*  CALCIUM  --  7.8* 7.9* 7.5* 7.4*  MG 1.6* 1.8 2.1  --   --   GFRNONAA  --  32* 44* 41* 51*  ANIONGAP  --  15 11 10 11     Recent Labs  Lab 05/10/22 0327 05/10/22 1437 05/10/22 2122  PROT 6.3*  --  5.7*  ALBUMIN 3.0* 2.6* 2.7*  AST 49*  --  73*  ALT 19  --  27  ALKPHOS 29*  --  29*  BILITOT 0.8  --  0.8   Lipids No results for input(s): "CHOL", "TRIG", "HDL", "LABVLDL", "LDLCALC", "CHOLHDL" in the last 168 hours.  Hematology Recent Labs  Lab 05/10/22 0327 05/10/22 2122  WBC 4.4 12.0*  RBC 5.03 4.56  HGB 15.2* 13.9  HCT 48.8* 42.2  MCV 97.0 92.5  MCH 30.2 30.5  MCHC 31.1 32.9  RDW 17.4* 17.2*  PLT 217 159   Thyroid  Recent Labs  Lab  05/10/22 0532  TSH 0.971  FREET4 1.03    BNP Recent Labs  Lab 05/10/22 0327  BNP 103.2*    DDimer No results for input(s): "DDIMER" in the last 168 hours.   Radiology/Studies:  DG Chest Port 1 View  Result Date: 05/10/2022 CLINICAL DATA:  Central line placement EXAM: PORTABLE CHEST 1 VIEW COMPARISON:  05/10/2022 FINDINGS: Right-sided PICC line observed with tip projecting over the lower SVC. No pneumothorax or complicating feature. Nasogastric tube enters the stomach. Atherosclerotic calcification of the aortic arch. The patient is rotated to the left on today's radiograph, reducing diagnostic sensitivity and specificity. Accentuated right infrahilar opacity, while some of this may be vascular, I cannot exclude infrahilar pneumonia or atelectasis. Borderline enlargement of the cardiopericardial silhouette. Greater density of the right chest compared the left is primarily attributable to distribution of soft tissues the chest wall. IMPRESSION: 1. Right-sided PICC line tip projects over the lower SVC. No pneumothorax. 2. Accentuated right infrahilar opacity, while some of this may be vascular, I cannot exclude infrahilar pneumonia or atelectasis. 3. Borderline enlargement of the cardiopericardial silhouette. 4.  Aortic Atherosclerosis (ICD10-I70.0). Electronically Signed   By: Van Clines M.D.   On: 05/10/2022 18:33   [place] EKG SITE RITE  Result Date: 05/10/2022 If [facility] image not attached, placement could  not be confirmed due to current cardiac rhythm.  CT ABDOMEN PELVIS WO CONTRAST  Result Date: 05/10/2022 CLINICAL DATA:  Unresponsiveness. EXAM: CT ABDOMEN AND PELVIS WITHOUT CONTRAST TECHNIQUE: Multidetector CT imaging of the abdomen and pelvis was performed following the standard protocol without IV contrast. RADIATION DOSE REDUCTION: This exam was performed according to the departmental dose-optimization program which includes automated exposure control, adjustment of the mA and/or  kV according to patient size and/or use of iterative reconstruction technique. COMPARISON:  6/10/8 FINDINGS: Exam detail is diminished secondary to body habitus. Lower chest: No pleural fluid or airspace consolidation Hepatobiliary: Hepatic steatosis identified. No focal liver abnormality. Stones within the dependent portion of the gallbladder. No signs of pericholecystic inflammation. No bile duct dilatation. Pancreas: Unremarkable. No pancreatic ductal dilatation or surrounding inflammatory changes. Spleen: Normal in size without focal abnormality. Adrenals/Urinary Tract: Adrenal glands are unremarkable. Kidneys are normal, without renal calculi, focal lesion, or hydronephrosis. Bladder is unremarkable. Stomach/Bowel: Enteric tube is identified with tip body of the stomach. Stomach is normal in appearance. The appendix is visualized and appears normal. No pathologic dilatation of the large or small bowel loops. No bowel wall thickening or inflammation. Vascular/Lymphatic: Aortic atherosclerosis. No aneurysm. No signs of abdominopelvic adenopathy. Reproductive: Multiple uterine fibroids are identified which appear calcified measuring up to 4.2 cm. No adnexal mass. Other: No free fluid or fluid collections identified within the abdomen or pelvis. No signs of pneumoperitoneum. Small soft tissue nodule within the left lower quadrant posterior to the descending colon measures 1.9 cm, image 49/2. This is of uncertain clinical significance Musculoskeletal: Laxity of the ventral abdominal wall is noted with anterior and inferior herniation bowel loops. Small fat containing umbilical hernia is noted, image 81/2. On study from 07/25/2006 there was a peritoneal cyst posterior to the descending colon. This may represent sequelae of involution of that cyst. There is a large pannus with marked diffuse skin thickening and subcutaneous soft tissue stranding. No discrete fluid collection identified within the pannus to suggest  abscess. Thoracolumbar degenerative disc disease. This is most severe at the L5-S1 level. IMPRESSION: 1. No acute findings within the abdomen or pelvis. 2. Large pannus with marked diffuse skin thickening and subcutaneous soft tissue stranding. Correlate for any clinical signs or symptoms of cellulitis. No discrete fluid collection identified within the pannus to suggest abscess. 3. Hepatic steatosis. 4. Gallstones. 5. Uterine fibroids. 6. Small fat containing umbilical hernia. 7. Laxity of the ventral abdominal wall with anterior and inferior herniation bowel loops. 8. Aortic Atherosclerosis (ICD10-I70.0). Electronically Signed   By: Kerby Moors M.D.   On: 05/10/2022 13:02   DG Abd Portable 1V  Result Date: 05/10/2022 CLINICAL DATA:  NG tube placement EXAM: PORTABLE ABDOMEN - 1 VIEW COMPARISON:  None Available. FINDINGS: NG tube tip and side port are in the stomach. Visualized bowel gas pattern is normal. Atelectasis of the partially visualized left lung base. IMPRESSION: NG tube tip and side port are in the stomach. Electronically Signed   By: Yetta Glassman M.D.   On: 05/10/2022 11:08   CT HEAD WO CONTRAST (5MM)  Result Date: 05/10/2022 CLINICAL DATA:  Mental status change with unknown cause. EXAM: CT HEAD WITHOUT CONTRAST TECHNIQUE: Contiguous axial images were obtained from the base of the skull through the vertex without intravenous contrast. RADIATION DOSE REDUCTION: This exam was performed according to the departmental dose-optimization program which includes automated exposure control, adjustment of the mA and/or kV according to patient size and/or use of iterative reconstruction technique.  COMPARISON:  None Available. FINDINGS: Brain: No evidence of acute infarction, hemorrhage, hydrocephalus, extra-axial collection or mass lesion/mass effect. Significant motion artifact. Vascular: No gross hyperdense vessel or unexpected calcification. Skull: Unremarkable Sinuses/Orbits: No acute finding  IMPRESSION: Moderate motion degradation.  No acute finding. Electronically Signed   By: Jorje Guild M.D.   On: 05/10/2022 06:20   DG Chest Port 1 View  Result Date: 05/10/2022 CLINICAL DATA:  Questionable sepsis EXAM: PORTABLE CHEST 1 VIEW COMPARISON:  02/07/2018 FINDINGS: Generalized interstitial thickening. Borderline heart size accentuated by leftward rotation. There could be left pleural fluid that is small volume. No pneumothorax or air bronchogram. IMPRESSION: Generalized interstitial opacity favoring edema. Electronically Signed   By: Jorje Guild M.D.   On: 05/10/2022 04:29     Assessment and Plan:   Atrial fibrillation RVR -History of paroxysmal atrial fibrillation -Patient remains asymptomatic -Has attempted several Valsalva maneuvers without success -Converted into A-fib last evening with short conversion to normal sinus -Converted back into A-fib RVR with rates above 150 -Continued on IV amiodarone with dosing increased back to 60 mg/hr and additional 150 mg bolus given -Patient not previously tolerated metoprolol causing worsening hypotension -Currently on heparin infusion -Chronic warfarin at home -On telemetry monitoring -TSH 0.971 -Likely driven from severe sepsis and shock  Severe sepsis with shock -Lactic acid on admission 8.6 trended down to 4.7 -Given fluids -Continues to be maintained on 2 separate pressors -Broad-spectrum antibiotic therapy -Management per CCM  Acute kidney injury on CKD -Serum creatinine 1.17 -Continues to improve -Monitor urine output, Foley catheter in place -Monitor/trend/replete electrolytes as needed -Daily BMP -Avoid nephrotoxic agents were able  Hypokalemia -Serum potassium 3.4 -Potassium supplementation ordered -Daily BMP -Monitor/trend/replete electrolytes as needed -Recommend keeping potassium level greater than 4 less than 5  History of arterial thrombus -INR 3.7, chronically on warfarin at home -unable to safely  take oral medications -Continued on heparin infusion  HFpEF -EF 55 to 60% in 01/2018 -Difficult to assess fluid status due to body habitus -BNP 103.2 -Slight elevation in high-sensitivity troponins were trended flat likely secondary to sepsis, shock, fever, tachycardia -Echocardiogram results pending -+4.7 L fluid from sepsis bundle -1.7 L output -Remains n.p.o. -Diurese as hemodynamics and renal function allow, is currently been placed on hold -Daily weights and I's and O's   Risk Assessment/Risk Scores:          CHA2DS2-VASc Score = 5   This indicates a 7.2% annual risk of stroke. The patient's score is based upon: CHF History: 1 HTN History: 1 Diabetes History: 0 Stroke History: 0 Vascular Disease History: 1 Age Score: 1 Gender Score: 1         For questions or updates, please contact [place] Please consult [email or web address] for contact info under    Signed, Kyron Schlitt, NP  05/11/2022 11:50 AM

## 2022-05-11 NOTE — Progress Notes (Signed)
0400 labs for 3/27 resulted as 2122 labs for 3/26.Lab notified. Unable to change to correct time/date.  0400 labs for 3/27 are as follows   Latest Reference Range & Units 05/10/22 21:22  COMPREHENSIVE METABOLIC PANEL  Rpt !  Sodium 135 - 145 mmol/L 137  Potassium 3.5 - 5.1 mmol/L 3.6  Chloride 98 - 111 mmol/L 105  CO2 22 - 32 mmol/L 21 (L)  Glucose 70 - 99 mg/dL 167 (H)  BUN 8 - 23 mg/dL 24 (H)  Creatinine 0.44 - 1.00 mg/dL 1.32 (H)  Calcium 8.9 - 10.3 mg/dL 7.9 (L)  Anion gap 5 - 15  11  Phosphorus 2.5 - 4.6 mg/dL 4.0  Magnesium 1.7 - 2.4 mg/dL 2.1  Alkaline Phosphatase 38 - 126 U/L 29 (L)  Albumin 3.5 - 5.0 g/dL 2.7 (L)  AST 15 - 41 U/L 73 (H)  ALT 0 - 44 U/L 27  Total Protein 6.5 - 8.1 g/dL 5.7 (L)  Total Bilirubin 0.3 - 1.2 mg/dL 0.8  GFR, Estimated >60 mL/min 44 (L)  Lactic Acid, Venous 0.5 - 1.9 mmol/L 4.6 (HH)  WBC 4.0 - 10.5 K/uL 12.0 (H)  RBC 3.87 - 5.11 MIL/uL 4.56  Hemoglobin 12.0 - 15.0 g/dL 13.9  HCT 36.0 - 46.0 % 42.2  MCV 80.0 - 100.0 fL 92.5  MCH 26.0 - 34.0 pg 30.5  MCHC 30.0 - 36.0 g/dL 32.9  RDW 11.5 - 15.5 % 17.2 (H)  Platelets 150 - 400 K/uL 159  nRBC 0.0 - 0.2 % 0.2  (HH): Data is critically high !: Data is abnormal (L): Data is abnormally low (H): Data is abnormally high Rpt: View report in Results Review for more information

## 2022-05-11 NOTE — Progress Notes (Signed)
Pt went into afib rvr, notified MD, order to increase amio from 30 to 60, had pt perform several valsalva maneuvers without success. Pt asymptomatic, will continue to monitor.

## 2022-05-11 NOTE — Progress Notes (Signed)
Pharmacy Antibiotic Note  Kayla Reyes is a 72 y.o. female admitted on 05/10/2022 with sepsis, acute renal failure  Pharmacy has been consulted for Vancomycin, meropenem dosing.  Will dose by levels due to unstable renal function.   Day 2 Antibiotics. Per CCMD, would like to continue empiric therapy d/t patient still running fevers and MRSA PCR being positive.  Plan: Meropenem 1 gm IV Q8H   Vancomycin 1500 mg IV Q 24 hrs. Goal AUC 400-550. Expected AUC: 423.9 Expected Cmin: 11.0 SCr used: 1.17, Ud used: 0.5  Will continue to monitor and adjust dose as appropriate.   Height: 5\' 7"  (170.2 cm) Weight: (!) 172.5 kg (380 lb 4.7 oz) IBW/kg (Calculated) : 61.6  Temp (24hrs), Avg:98.8 F (37.1 C), Min:98.3 F (36.8 C), Max:100.1 F (37.8 C)  Recent Labs  Lab 05/10/22 0327 05/10/22 0532 05/10/22 0932 05/10/22 1127 05/10/22 1437 05/10/22 2122 05/10/22 2146 05/11/22 0410 05/11/22 0800 05/11/22 1042  WBC 4.4  --   --   --   --  12.0*  --   --   --   --   CREATININE 1.94*  --   --   --  1.71* 1.32* 1.38*  --   --  1.17*  LATICACIDVEN  --    < > 7.9* 7.5* 6.2* 4.6*  --  4.1*  --   --   VANCORANDOM  --   --   --   --   --   --   --   --  8  --    < > = values in this interval not displayed.     Estimated Creatinine Clearance: 75.9 mL/min (A) (by C-G formula based on SCr of 1.17 mg/dL (H)).    No Known Allergies  Antimicrobials this admission: Vancomycin  3/26 >>  Meropenem  3/26 >>  Fluconazole 3/26 >>  Dose adjustments this admission:   Microbiology results: 3/27 BCx: NGTD 3/27 Resp cx: NGTD 3/26 MRSA PCR: detected  Thank you for allowing pharmacy to be a part of this patient's care.  Pearla Dubonnet 05/11/2022 2:31 PM

## 2022-05-11 NOTE — Progress Notes (Signed)
Initial Nutrition Assessment  DOCUMENTATION CODES:   Morbid obesity  INTERVENTION:   Vital 1.5@70ml /hr- Initiate at 54ml/hr, once tolerating, increase by 57ml/hr q 8 hours until goal rate is reached.   ProSource TF 20- Give 37ml daily via tube, each supplement provides 80kcal and 20g of protein.   Free water flushes 101ml q4 hours to maintain tube patency   Regimen provides 2600kcal/day, 133g/day protein and 1493ml/day of free water.   Pt at high refeed risk; recommend monitor potassium, magnesium and phosphorus labs daily until stable  Juven Fruit Punch BID via tube, each serving provides 95kcal and 2.5g of protein (amino acids glutamine and arginine)  Vitamin C 500mg  BID via tube   Daily weights  NUTRITION DIAGNOSIS:   Inadequate oral intake related to acute illness as evidenced by NPO status.  GOAL:   Patient will meet greater than or equal to 90% of their needs  MONITOR:   Diet advancement, Labs, Weight trends, I & O's, Skin  REASON FOR ASSESSMENT:   Rounds    ASSESSMENT:   72 y/o female with h/o HTN, L AKA, hypothyroidism, RA, thrombosis of aorta and renal artery, PAF and CHF who is admitted with sepsis, infected panus, AKI and CHF excerbation.  Visited pt's room today. Pt is a little more alert today. NGT to in place to LIS with 254ml output. CT scan from 3/26 with NSF involving the GI. Pt with continued nausea today. Will plan to initiate reglan and trickle tube feeds today and advance as tolerated. Pt is at refeed risk. There is no recent weight history in chart to determine if patient has had any recent weight changes.   Medications reviewed and include: insulin, solu-cortef, synthroid, reglan, protonix, thiamine, diflucan, heparin, meropenem, levophed, Na bicarbonate, vancomycin, vasopressin   Labs reviewed: K 3.4(L), creat 1.17(H) P 4.0 wnl, Mg 2.1 wnl- 3/26 Wbc- 12.0(H)- 3/26 Cbgs- 146, 147, 165 x 24 hrs AIC 6.5(H)- 3/26  NUTRITION - FOCUSED PHYSICAL  EXAM:  Flowsheet Row Most Recent Value  Orbital Region No depletion  Upper Arm Region No depletion  Thoracic and Lumbar Region No depletion  Buccal Region No depletion  Temple Region No depletion  Clavicle Bone Region No depletion  Clavicle and Acromion Bone Region No depletion  Scapular Bone Region No depletion  Dorsal Hand No depletion  Patellar Region No depletion  Anterior Thigh Region No depletion  Posterior Calf Region No depletion  Edema (RD Assessment) Mild  Hair Reviewed  Eyes Reviewed  Mouth Reviewed  Skin Reviewed  Nails Reviewed   Diet Order:   Diet Order             Diet NPO time specified  Diet effective now                  EDUCATION NEEDS:   No education needs have been identified at this time  Skin:  Skin Assessment: Reviewed RN Assessment (Stage II sacrum, MASD, cellulitis panus)  Last BM:  pta  Height:   Ht Readings from Last 1 Encounters:  05/10/22 5\' 7"  (1.702 m)    Weight:   Wt Readings from Last 1 Encounters:  05/11/22 (!) 172.5 kg    Ideal Body Weight:  61.36 kg  BMI:  Body mass index is 59.56 kg/m.  Estimated Nutritional Needs:   Kcal:  2700-3000kcal/day  Protein:  >135g/day  Fluid:  1.5-1.7L/day  Koleen Distance MS, RD, LDN Please refer to [facility] for RD and/or RD on-call/weekend/after hours pager

## 2022-05-12 ENCOUNTER — Inpatient Hospital Stay: Payer: Medicare HMO

## 2022-05-12 DIAGNOSIS — I959 Hypotension, unspecified: Secondary | ICD-10-CM

## 2022-05-12 DIAGNOSIS — R509 Fever, unspecified: Secondary | ICD-10-CM

## 2022-05-12 DIAGNOSIS — I480 Paroxysmal atrial fibrillation: Secondary | ICD-10-CM

## 2022-05-12 DIAGNOSIS — N179 Acute kidney failure, unspecified: Secondary | ICD-10-CM | POA: Diagnosis not present

## 2022-05-12 DIAGNOSIS — I4891 Unspecified atrial fibrillation: Secondary | ICD-10-CM

## 2022-05-12 DIAGNOSIS — A419 Sepsis, unspecified organism: Secondary | ICD-10-CM | POA: Diagnosis not present

## 2022-05-12 DIAGNOSIS — L89159 Pressure ulcer of sacral region, unspecified stage: Secondary | ICD-10-CM

## 2022-05-12 DIAGNOSIS — R6521 Severe sepsis with septic shock: Secondary | ICD-10-CM | POA: Diagnosis not present

## 2022-05-12 LAB — BASIC METABOLIC PANEL
Anion gap: 7 (ref 5–15)
Anion gap: 8 (ref 5–15)
BUN: 23 mg/dL (ref 8–23)
BUN: 20 mg/dL (ref 8–23)
CO2: 26 mmol/L (ref 22–32)
CO2: 23 mmol/L (ref 22–32)
Calcium: 7.3 mg/dL — ABNORMAL LOW (ref 8.9–10.3)
Calcium: 7.1 mg/dL — ABNORMAL LOW (ref 8.9–10.3)
Chloride: 105 mmol/L (ref 98–111)
Chloride: 104 mmol/L (ref 98–111)
Creatinine, Ser: 0.95 mg/dL (ref 0.44–1.00)
Creatinine, Ser: 0.87 mg/dL (ref 0.44–1.00)
GFR, Estimated: >60 mL/min (ref >60)
GFR, Estimated: >60 mL/min (ref >60)
Glucose, Bld: 125 mg/dL — ABNORMAL HIGH (ref 70–99)
Glucose, Bld: 171 mg/dL — ABNORMAL HIGH (ref 70–99)
Potassium: 3.5 mmol/L (ref 3.5–5.1)
Potassium: 3.4 mmol/L — ABNORMAL LOW (ref 3.5–5.1)
Sodium: 138 mmol/L (ref 135–145)
Sodium: 135 mmol/L (ref 135–145)

## 2022-05-12 LAB — HEPARIN LEVEL (UNFRACTIONATED)
Heparin Unfractionated: 0.29 IU/mL — ABNORMAL LOW (ref 0.30–0.70)
Heparin Unfractionated: 0.3 IU/mL (ref 0.30–0.70)
Heparin Unfractionated: 0.3 IU/mL (ref 0.30–0.70)

## 2022-05-12 LAB — CBC
HCT: 39.9 % (ref 36.0–46.0)
Hemoglobin: 13.3 g/dL (ref 12.0–15.0)
MCH: 30.9 pg (ref 26.0–34.0)
MCHC: 33.3 g/dL (ref 30.0–36.0)
MCV: 92.6 fL (ref 80.0–100.0)
Platelets: 171 10*3/uL (ref 150–400)
RBC: 4.31 MIL/uL (ref 3.87–5.11)
RDW: 17.2 % — ABNORMAL HIGH (ref 11.5–15.5)
WBC: 15.3 10*3/uL — ABNORMAL HIGH (ref 4.0–10.5)
nRBC: 0.1 % (ref 0.0–0.2)

## 2022-05-12 LAB — GLUCOSE, CAPILLARY
Glucose-Capillary: 112 mg/dL — ABNORMAL HIGH (ref 70–99)
Glucose-Capillary: 130 mg/dL — ABNORMAL HIGH (ref 70–99)
Glucose-Capillary: 130 mg/dL — ABNORMAL HIGH (ref 70–99)
Glucose-Capillary: 133 mg/dL — ABNORMAL HIGH (ref 70–99)
Glucose-Capillary: 162 mg/dL — ABNORMAL HIGH (ref 70–99)
Glucose-Capillary: 96 mg/dL (ref 70–99)

## 2022-05-12 LAB — MAGNESIUM
Magnesium: 2.1 mg/dL (ref 1.7–2.4)
Magnesium: 1.9 mg/dL (ref 1.7–2.4)

## 2022-05-12 LAB — LACTIC ACID, PLASMA: Lactic Acid, Venous: 2.2 mmol/L (ref 0.5–1.9)

## 2022-05-12 LAB — PHOSPHORUS: Phosphorus: 3 mg/dL (ref 2.5–4.6)

## 2022-05-12 MED ORDER — AMIODARONE IV BOLUS ONLY 150 MG/100ML
150.0000 mg | Freq: Once | INTRAVENOUS | Status: AC
  Administered 2022-05-12: 150 mg via INTRAVENOUS
  Filled 2022-05-12: qty 100

## 2022-05-12 MED ORDER — POTASSIUM CHLORIDE 10 MEQ/50ML IV SOLN
10.0000 meq | INTRAVENOUS | Status: AC
  Administered 2022-05-13 (×6): 10 meq via INTRAVENOUS
  Filled 2022-05-12 (×6): qty 50

## 2022-05-12 MED ORDER — HEPARIN BOLUS VIA INFUSION
1550.0000 [IU] | Freq: Once | INTRAVENOUS | Status: AC
  Administered 2022-05-12: 1550 [IU] via INTRAVENOUS
  Filled 2022-05-12: qty 1550

## 2022-05-12 MED ORDER — ONDANSETRON HCL 4 MG/2ML IJ SOLN
4.0000 mg | Freq: Four times a day (QID) | INTRAMUSCULAR | Status: DC
  Administered 2022-05-12 – 2022-05-17 (×20): 4 mg via INTRAVENOUS
  Filled 2022-05-12 (×20): qty 2

## 2022-05-12 MED ORDER — VITAL 1.5 CAL PO LIQD: 1000.0000 mL | ORAL | Status: DC

## 2022-05-12 MED ORDER — LINEZOLID 600 MG/300ML IV SOLN
600.0000 mg | Freq: Two times a day (BID) | INTRAVENOUS | Status: AC
  Administered 2022-05-12 – 2022-05-15 (×8): 600 mg via INTRAVENOUS
  Filled 2022-05-12 (×8): qty 300

## 2022-05-12 MED ORDER — METOPROLOL TARTRATE 5 MG/5ML IV SOLN
INTRAVENOUS | Status: AC
  Filled 2022-05-12: qty 5

## 2022-05-12 MED ORDER — POTASSIUM CHLORIDE 10 MEQ/50ML IV SOLN
10.0000 meq | INTRAVENOUS | Status: AC
  Administered 2022-05-12 (×4): 10 meq via INTRAVENOUS
  Filled 2022-05-12 (×4): qty 50

## 2022-05-12 MED ORDER — DIGOXIN 0.25 MG/ML IJ SOLN
0.2500 mg | Freq: Once | INTRAMUSCULAR | Status: AC
  Administered 2022-05-12: 0.25 mg via INTRAVENOUS
  Filled 2022-05-12: qty 2

## 2022-05-12 MED ORDER — DIGOXIN 0.25 MG/ML IJ SOLN
0.5000 mg | Freq: Once | INTRAMUSCULAR | Status: AC
  Administered 2022-05-13: 0.5 mg via INTRAVENOUS
  Filled 2022-05-12: qty 2

## 2022-05-12 MED ORDER — FLEET ENEMA 7-19 GM/118ML RE ENEM
1.0000 | ENEMA | Freq: Once | RECTAL | Status: AC
  Administered 2022-05-12: 1 via RECTAL

## 2022-05-12 MED ORDER — SODIUM CHLORIDE 0.9 % IV SOLN
12.5000 mg | Freq: Four times a day (QID) | INTRAVENOUS | Status: DC | PRN
  Administered 2022-05-12 – 2022-05-13 (×2): 12.5 mg via INTRAVENOUS
  Filled 2022-05-12 (×2): qty 12.5

## 2022-05-12 MED ORDER — METOPROLOL TARTRATE 5 MG/5ML IV SOLN
2.5000 mg | INTRAVENOUS | Status: AC | PRN
  Administered 2022-05-12: 2.5 mg via INTRAVENOUS

## 2022-05-12 NOTE — Progress Notes (Signed)
ANTICOAGULATION CONSULT NOTE - Initial Consult  Pharmacy Consult for Heparin Indication:  arterial thrombosis  No Known Allergies  Patient Measurements: Height: 5\' 7"  (170.2 cm) Weight: (!) 172.5 kg (380 lb 4.7 oz) IBW/kg (Calculated) : 61.6 Heparin Dosing Weight: 104 kg   Vital Signs: Temp: 99 F (37.2 C) (03/28 0400) Temp Source: Oral (03/28 0400) BP: 80/64 (03/28 0500) Pulse Rate: 130 (03/28 0500)  Labs: Recent Labs    05/10/22 0327 05/10/22 0532 05/10/22 0932 05/10/22 1930 05/10/22 2122 05/10/22 2146 05/11/22 0214 05/11/22 0410 05/11/22 1042 05/11/22 1936 05/12/22 0357 05/12/22 0438  HGB 15.2*  --   --   --  13.9  --   --   --   --   --  13.3  --   HCT 48.8*  --   --   --  42.2  --   --   --   --   --  39.9  --   PLT 217  --   --   --  159  --   --   --   --   --  171  --   APTT 31  --   --   --   --   --   --   --   --   --   --   --   LABPROT 29.1*  --   --   --   --  36.4*  --   --   --   --   --   --   INR 2.8*  --   --   --   --  3.7*  --   --   --   --   --   --   HEPARINUNFRC  --   --    < > 0.39  --  0.30 0.36  --   --   --   --  0.29*  CREATININE 1.94*  --    < >  --  1.32* 1.38*  --   --  1.17* 1.11* 0.95  --   CKTOTAL  --   --   --   --   --   --   --  2,587*  --   --   --   --   TROPONINIHS 38* 51*  --  63*  --   --   --   --   --   --   --   --    < > = values in this interval not displayed.     Estimated Creatinine Clearance: 93.5 mL/min (by C-G formula based on SCr of 0.95 mg/dL).   Medical History: Past Medical History:  Diagnosis Date   Arterial thrombosis ([place])    a. s/p thromboectomy and thrombolysis with left BKA in 2008 on chronic Coumadin    HLD (hyperlipidemia)    Hypertension    Hypothyroidism    Obesity    PAF (paroxysmal atrial fibrillation) ([place])    a. noted 12/19 in the setting of hypokalemia; b. CHADS2VASc 4 (HTN, age x 1, vascular disease, female); c. on chronic Coumadin in the setting of prior arterial clot   Rheumatoid  arthritis ([place])     Medications:  Medications Prior to Admission  Medication Sig Dispense Refill Last Dose   albuterol (PROVENTIL HFA;VENTOLIN HFA) 108 (90 Base) MCG/ACT inhaler Inhale 2 puffs into the lungs every 6 (six) hours as needed for wheezing or shortness of breath.   prn at unk  fluticasone (FLONASE) 50 MCG/ACT nasal spray Place 1 spray into both nostrils daily as needed for allergies or rhinitis.   prn at unk   furosemide (LASIX) 40 MG tablet Take 40 mg by mouth daily.      gabapentin (NEURONTIN) 600 MG tablet Take 600 mg by mouth 3 (three) times daily.      hydroxychloroquine (PLAQUENIL) 200 MG tablet TAKE 2 TABLETS(400 MG) BY MOUTH EVERY DAY      levothyroxine (SYNTHROID) 150 MCG tablet Take 150 mcg by mouth daily.      lovastatin (MEVACOR) 20 MG tablet Take 20 mg by mouth every evening.      methotrexate (RHEUMATREX) 2.5 MG tablet Take 2.5 mg by mouth once a week.       metoprolol tartrate (LOPRESSOR) 25 MG tablet Take 25 mg by mouth 2 (two) times daily.       potassium chloride (KLOR-CON M) 10 MEQ tablet Take 10 mEq by mouth daily.      predniSONE (DELTASONE) 1 MG tablet Take by mouth.      TRADJENTA 5 MG TABS tablet Take 5 mg by mouth daily.      warfarin (COUMADIN) 10 MG tablet Take 10 mg by mouth daily.      furosemide (LASIX) 20 MG tablet Take 1 tablet (20 mg total) by mouth daily. 30 tablet 5    JARDIANCE 10 MG TABS tablet Take 10 mg by mouth daily.   unk at unk   levothyroxine (SYNTHROID, LEVOTHROID) 137 MCG tablet Take 137 mcg by mouth daily before breakfast. (Patient not taking: Reported on 05/10/2022)   Not Taking   predniSONE (DELTASONE) 5 MG tablet Take 5 mg by mouth daily with breakfast.  (Patient not taking: Reported on 05/10/2022)   Not Taking    Assessment: Pharmacy consulted to dose heparin in this 72 year old female admitted for sepsis and hx of PAF and arterial thrombosis.   Pt was on warfarin 10 mg PO daily PTA  3/26:  INR @ 0327 = 2.8  Goal of Therapy:   Heparin level 0.3-0.7 units/ml Monitor platelets by anticoagulation protocol: Yes  Date Time: HL: Rate: 3/26 0932 0.13  Subtherapeutic/ 1700 > 2000 u/hr 3/26 1930 0.39 Therapeutic x1/ 2000 u/hr 3/26     2146   0.30     Therapeutic X 2 / 2000 un/hr 3/27     0214   0.36     Therapeutic X 3  3/28     0438   0.29     Therapeutic X 4   Plan:  3/28:  HL @ 0438 = 0.29 - Will order heparin 1550 units IV X 1 bolus and increase drip rate to 2200 units/hr. - Will recheck HL 6 hrs after rate change   Kayla Reyes Clinical Pharmacist 05/12/2022 5:17 AM

## 2022-05-12 NOTE — Progress Notes (Signed)
Nutrition Follow Up Note   DOCUMENTATION CODES:   Morbid obesity  INTERVENTION:   Vital 1.5@70ml /hr- Initiate at 79ml/hr, once tolerating, increase by 54ml/hr q 8 hours until goal rate is reached.   ProSource TF 20- Give 16ml daily via tube, each supplement provides 80kcal and 20g of protein.   Free water flushes 31ml q4 hours to maintain tube patency   Regimen provides 2600kcal/day, 133g/day protein and 1430ml/day of free water.   Pt at high refeed risk; recommend monitor potassium, magnesium and phosphorus labs daily until stable  Juven Fruit Punch BID via tube, each serving provides 95kcal and 2.5g of protein (amino acids glutamine and arginine)  Vitamin C 500mg  BID via tube   Daily weights  NUTRITION DIAGNOSIS:   Inadequate oral intake related to acute illness as evidenced by NPO status.  GOAL:   Patient will meet greater than or equal to 90% of their needs -not met   MONITOR:   Diet advancement, Labs, Weight trends, I & O's, Skin, tube feeds   ASSESSMENT:   72 y/o female with h/o HTN, L AKA, hypothyroidism, RA, thrombosis of aorta and renal artery, PAF and CHF who is admitted with sepsis, infected panus, AKI and CHF excerbation.  Pt had an episode of emesis yesterday after tube feeds initiated. Tube feeds were held overnight. Pt has not had anymore vomiting since yesterday but reports ongoing nausea today. NGT to LIS with 326ml out overnight and ~174ml in canister this morning. KUB from today with NSF. CT scan from 3/26 with NSF. Pt does have moderate stool burden on KUB; RN reports that patient has a good BM this morning. Reglan initiated yesterday. Zofran and phenergan scheduled for today. Will attempt to restart trickle feeds today and advance as tolerated. Pt remains at refeed risk.   Medications reviewed and include: vitamin C, colace, solu-cortef, insulin, synthroid, reglan, zofran, protonix, miralax, thiamine, diflucan, heparin, linezolid, levophed, vasopressin    Labs reviewed: K 3.5 wnl, P 3.0 wnl, Mg 2.1 wnl Wbc- 15.3(H) Cbgs- 112, 96, 130 x 24 hrs  Diet Order:    Diet Order             Diet NPO time specified  Diet effective now                  EDUCATION NEEDS:   No education needs have been identified at this time  Skin:  Skin Assessment: Reviewed RN Assessment (Stage II sacrum, MASD, cellulitis panus)  Last BM:  3/28- type 6  Height:   Ht Readings from Last 1 Encounters:  05/10/22 5\' 7"  (1.702 m)    Weight:   Wt Readings from Last 1 Encounters:  05/11/22 (!) 172.5 kg    Ideal Body Weight:  61.36 kg  BMI:  Body mass index is 59.56 kg/m.  Estimated Nutritional Needs:   Kcal:  2700-3000kcal/day  Protein:  >135g/day  Fluid:  1.5-1.7L/day  Koleen Distance MS, RD, LDN Please refer to [facility] for RD and/or RD on-call/weekend/after hours pager

## 2022-05-12 NOTE — Progress Notes (Signed)
Pt continues to c/o nausea. Tube feeds not started at this time. Md notified. Will reassess and attempt again later.

## 2022-05-12 NOTE — Progress Notes (Signed)
NAMEMariena Reyes, MRN:  [id number], DOB:  1950-06-19, LOS: 2 ADMISSION DATE:  05/10/2022, CONSULTATION DATE:  05/10/22 REFERRING MD:  Dr. Beather Arbour, CHIEF COMPLAINT:  Unresponsive   History of Present Illness:  72 yo F presenting to [facility] ED from home via EMS on 05/10/22 for evaluation of altered mental status.  History provided by son bedside with assistance intermittently from the patient who remains lethargic. Family and patient report that she was in her normal state of health until late in the evening on 05/09/22 when she began having significant chills. Then she became less responsive prompting family to call EMS. Patient denies pain anywhere, fever, urinary symptoms, dyspnea, chest pain, nausea/ vomiting/ diarrhea. She is audibly wheezing and appears mildly dyspneic but both the patient and the son report this as baseline. They also endorse a chronic productive cough. She does not wear oxygen at home, and is bed-bound. She lives with family who assist her in her needs. She endorses a wound on her "backside" that her sister cleans. Her son bedside denies being told of any changes to this wound recently. She has a remote smoking history, having quit in 2008. She denies any ETOH use/ recreational drug use. She is being treated by rheumatology for RA. She has been taking all medication as prescribed including her warfarin.  ED course: Upon arrival patient febrile, tachypneic & tachycardic with labile blood pressures meeting sepsis criteria. Empiric antibiotics ordered as well as 2 L IVF bolus- patient's 30 mL/kg would be closer to 6 L but due to the patient's respiratory status, EDP chose to be conservative. Lab work significant for lactic acidosis, hypokalemia, AKI, NAGMA, mild transaminitis, elevated INR, mildly elevated troponin & BNP. BP continues to be marginal and PCCM consulted for admission due to concern for the need of vasopressor support.  Medications given: Tylenol, cefepime/vancomycin/  flagyl, 2 L LR bolus  Initial Vitals: 102.7, 30, 125, 104/88 & 92 on RA EKG Interpretation: Date: 05/10/22, EKG Time: 03:23, Rate: 125, Rhythm: ST, QRS Axis:  borderline RAD, Intervals: prolonged QTc, ST/T Wave abnormalities: non-specific T wave abnormalities, Narrative Interpretation: ST with prolonged Qtc and non-specific T wave abnormalities Chemistry: Na+: 138, K+: 3.2, BUN/Cr.: 18/ 1.94, Serum CO2/ AG: 19/11, AST: 49 Hematology: WBC: 4.4, Hgb: 15.2,  Troponin: 38, BNP: 103.2, Lactic/ PCT: 8.6/ pending, COVID-19 & Influenza A/B: pending  ABG: 7.4/ 27/ 71/ 16.7 CXR 05/10/22: generalized interstitial opacity favoring edema CT head wo contrast 05/10/22: no acute finding  PCCM consulted for admission due to severe sepsis secondary to unknown etiology requiring peripheral vasopressor support.  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 3/27 remains on pressors  Interim History / Subjective:  Remains on pressors On AMIO More awake   Objective   Blood pressure (!) 79/52, pulse (!) 107, temperature 99 F (37.2 C), temperature source Oral, resp. rate (!) 22, height 5\' 7"  (1.702 m), weight (!) 172.5 kg, SpO2 99 %.        Intake/Output Summary (Last 24 hours) at 05/12/2022 0704 Last data filed at 05/12/2022 [id number] Gross per 24 hour  Intake 3687.17 ml  Output 2390 ml  Net 1297.17 ml    Danley Danker  Weights   05/10/22 0322 05/11/22 0709  Weight: (!) 167.1 kg (!) 172.5 kg       Review of Systems: Gen:  Denies  fever, sweats, chills weight loss  Other:  All other systems negative   Physical Examination:    General Appearance: No distress  EYES PERRLA, EOM intact.   NECK Supple, No JVD Pulmonary: normal breath sounds, No wheezing.  CardiovascularNormal S1,S2.  No m/r/g.   Abdomen: Benign, Soft, non-tender. Neurology UE/LE 5/5 strength, no focal deficits Ext pulses intact, cap refill intact ALL OTHER ROS ARE NEGATIVE       Assessment & Plan:  Severe Sepsis with shock due infected PANUS Lactic: 8.6, Baseline PCT: pending, UA: +glucose + small Hgb, CXR: generalized interstitial opacity favoring edema  - Supplemental oxygen as needed, to maintain SpO2 > 90% - f/u cultures, trend lactic/ PCT - broad empiric IV antibiotics due to immunosuppressant use outpt: cefepime / vancomycin / flagyl/Fluconazole -conservative IVF hydration due to respiratory status  vasopressors to maintain MAP< 65: norepinephrine  RENAL -continue Foley Catheter-assess need -Avoid nephrotoxic agents -Follow urine output, BMP -Ensure adequate renal perfusion, optimize oxygenation -Renal dose medications   Intake/Output Summary (Last 24 hours) at 05/12/2022 0704 Last data filed at 05/12/2022 [id number] Gross per 24 hour  Intake 3687.17 ml  Output 2390 ml  Net 1297.17 ml      Chronic Rheumatoid Arthritis - continue daily Plaquenil - hold methotrexate due to suspected sepsis, hold gabapentin due to lethargy > consider restarting as patient stabilizes - prednisone changed to solu medrol as above  Chronic Anticoagulation UNCONTROLLED AFIB HFpEF with acute exacerbation  Mildly Elevated Troponin s/t demand ischemia  PMHx: PAF, Arterial Thrombus, HLD - initiate heparin per pharmacy protocol  - diurese with lasix as hemodynamics and renal fxn allow > hold for now - trend troponin to peak FOLLOW UP CARDIOLOGY RECS GIVEN BETA BLOCKER   ENDO - ICU hypoglycemic\Hyperglycemia protocol -check FSBS per protocol   GI GI PROPHYLAXIS as indicated  NUTRITIONAL STATUS DIET--> as tolerated Constipation protocol  as indicated   ELECTROLYTES -follow labs as needed -replace as needed -pharmacy consultation and following  Best Practice (right click and "Reselect all SmartList Selections" daily)  Diet/type: NPO DVT prophylaxis: systemic heparin GI prophylaxis: PPI Lines: N/A Foley:  N/A Code Status:  full code   Labs   CBC: Recent Labs  Lab 05/10/22 0327 05/10/22 2122 05/12/22 0357  WBC 4.4 12.0* 15.3*  NEUTROABS 3.5  --   --   HGB 15.2* 13.9 13.3  HCT 48.8* 42.2 39.9  MCV 97.0 92.5 92.6  PLT 217 159 171     Basic Metabolic Panel: Recent Labs  Lab 05/10/22 0532 05/10/22 1437 05/10/22 2122 05/10/22 2146 05/11/22 1042 05/11/22 1936 05/12/22 0357  NA  --  133* 137 134* 138 135 138  K  --  3.7 3.6 3.6 3.4* 3.9 3.5  CL  --  103 105 106 106 103 105  CO2  --  15* 21* 18* 21* 24 26  GLUCOSE  --  174* 167* 183* 142* 152* 125*  BUN  --  23 24* 23 22 21 23   CREATININE  --  1.71* 1.32* 1.38* 1.17* 1.11* 0.95  CALCIUM  --  7.8* 7.9* 7.5* 7.4* 7.3* 7.3*  MG 1.6* 1.8 2.1  --   --  2.1 2.1  PHOS 3.1 4.0 4.0  --   --  3.4 3.0    GFR: Estimated Creatinine Clearance: 93.5 mL/min (by C-G formula based  on SCr of 0.95 mg/dL). Recent Labs  Lab 05/10/22 0327 05/10/22 0532 05/10/22 0932 05/10/22 1437 05/10/22 2122 05/11/22 0410 05/12/22 0357  PROCALCITON  --  78.45  --   --  68.27  --   --   WBC 4.4  --   --   --  12.0*  --  15.3*  LATICACIDVEN  --  >9.0*   < > 6.2* 4.6* 4.1* 2.2*   < > = values in this interval not displayed.     Liver Function Tests: Recent Labs  Lab 05/10/22 0327 05/10/22 1437 05/10/22 2122  AST 49*  --  73*  ALT 19  --  27  ALKPHOS 29*  --  29*  BILITOT 0.8  --  0.8  PROT 6.3*  --  5.7*  ALBUMIN 3.0* 2.6* 2.7*    No results for input(s): "LIPASE", "AMYLASE" in the last 168 hours. No results for input(s): "AMMONIA" in the last 168 hours.  ABG    Component Value Date/Time   PHART 7.44 05/11/2022 1139   PCO2ART 37 05/11/2022 1139   PO2ART 86  05/11/2022 1139   HCO3 25.1 05/11/2022 1139   ACIDBASEDEF 9.3 (H) 05/10/2022 1353   O2SAT 98.4 05/11/2022 1139     Coagulation Profile: Recent Labs  Lab 05/10/22 0327 05/10/22 2146  INR 2.8* 3.7*     Cardiac Enzymes: Recent Labs  Lab 05/11/22 0410  CKTOTAL 2,587*     HbA1C: Hgb A1c MFr Bld  Date/Time Value Ref Range Status  05/10/2022 05:32 AM 6.5 (H) 4.8 - 5.6 % Final    Comment:    (NOTE)         Prediabetes: 5.7 - 6.4         Diabetes: >6.4         Glycemic control for adults with diabetes: <7.0   02/08/2018 06:20 AM 6.1 (H) 4.8 - 5.6 % Final    Comment:    (NOTE) Pre diabetes:          5.7%-6.4% Diabetes:              >6.4% Glycemic control for   <7.0% adults with diabetes     CBG: Recent Labs  Lab 05/11/22 1207 05/11/22 1549 05/11/22 1933 05/11/22 2337 05/12/22 0331  GLUCAP 151* 145* 121* 134* 130*      DVT/GI PRX  assessed I Assessed the need for Labs I Assessed the need for Foley I Assessed the need for Central Venous Line Family Discussion when available I Assessed the need for Mobilization I made an Assessment of medications to be adjusted accordingly Safety Risk assessment completed  CASE DISCUSSED IN MULTIDISCIPLINARY ROUNDS WITH ICU TEAM   PROGNOSIS IS POOR, [place] Time devoted to patient care services described in this note is 55 minutes.  Critical care was necessary to treat /prevent imminent and life-threatening deterioration. Overall, patient is critically ill, prognosis is guarded.  Patient with Multiorgan failure and at high risk for cardiac arrest and death.    Corrin Parker, M.D.  [facility]  Medical Director [place] Director [facility] Cardio-Pulmonary Department

## 2022-05-12 NOTE — Progress Notes (Signed)
   05/12/22 1700  Spiritual Encounters  Type of Visit Initial  Care provided to: Pt and family  Referral source Nurse (RN/NT/LPN)  Reason for visit Advance directives  OnCall Visit Yes  Spiritual Framework  Presenting Themes Caregiving needs  Community/Connection Family  Patient Stress Factors Not reviewed  Family Stress Factors Major life changes  Interventions  Spiritual Care Interventions Made Established relationship of care and support;Compassionate presence;Reflective listening;Encouragement  Spiritual Care Plan  Spiritual Care Issues Still Outstanding Chaplain will continue to follow   Spoke with the patient's son and daughter in-law about advance directive. Give son paperwork and he will try to get it filled out and turn in by tomorrow. Patient was resting while I was speaking to her son and daughter in law. Advise son that a chaplain is here 24/7,

## 2022-05-12 NOTE — Progress Notes (Signed)
ANTICOAGULATION CONSULT NOTE - Initial Consult  Pharmacy Consult for Heparin Indication:  arterial thrombosis  No Known Allergies  Patient Measurements: Height: 5\' 7"  (170.2 cm) Weight: (!) 172.5 kg (380 lb 4.7 oz) IBW/kg (Calculated) : 61.6 Heparin Dosing Weight: 104 kg   Vital Signs: Temp: 99 F (37.2 C) (03/28 1600) Temp Source: Axillary (03/28 1600) BP: 101/69 (03/28 1900) Pulse Rate: 133 (03/28 1900)  Labs: Recent Labs    05/10/22 0327 05/10/22 0532 05/10/22 0932 05/10/22 1930 05/10/22 2122 05/10/22 2146 05/11/22 0214 05/11/22 0410 05/11/22 1042 05/11/22 1936 05/12/22 0357 05/12/22 0438 05/12/22 1142 05/12/22 1921  HGB 15.2*  --   --   --  13.9  --   --   --   --   --  13.3  --   --   --   HCT 48.8*  --   --   --  42.2  --   --   --   --   --  39.9  --   --   --   PLT 217  --   --   --  159  --   --   --   --   --  171  --   --   --   APTT 31  --   --   --   --   --   --   --   --   --   --   --   --   --   LABPROT 29.1*  --   --   --   --  36.4*  --   --   --   --   --   --   --   --   INR 2.8*  --   --   --   --  3.7*  --   --   --   --   --   --   --   --   HEPARINUNFRC  --   --    < > 0.39  --  0.30   < >  --   --   --   --  0.29* 0.30 0.30  CREATININE 1.94*  --    < >  --  1.32* 1.38*  --   --  1.17* 1.11* 0.95  --   --   --   CKTOTAL  --   --   --   --   --   --   --  2,587*  --   --   --   --   --   --   TROPONINIHS 38* 51*  --  63*  --   --   --   --   --   --   --   --   --   --    < > = values in this interval not displayed.     Estimated Creatinine Clearance: 93.5 mL/min (by C-G formula based on SCr of 0.95 mg/dL).   Medical History: Past Medical History:  Diagnosis Date   Arterial thrombosis ([place])    a. 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 noted 12/19 in the setting of  hypokalemia; b. CHADS2VASc 4 (HTN, age x 1, vascular disease, female); c. on chronic Coumadin  in the setting of prior arterial clot   Rheumatoid arthritis (HCC)     Medications:  Medications Prior to Admission  Medication Sig Dispense Refill Last Dose   albuterol (PROVENTIL HFA;VENTOLIN HFA) 108 (90 Base) MCG/ACT inhaler Inhale 2 puffs into the lungs every 6 (six) hours as needed for wheezing or shortness of breath.   prn at unk   fluticasone (FLONASE) 50 MCG/ACT nasal spray Place 1 spray into both nostrils daily as needed for allergies or rhinitis.   prn at unk   furosemide (LASIX) 40 MG tablet Take 40 mg by mouth daily.      gabapentin (NEURONTIN) 600 MG tablet Take 600 mg by mouth 3 (three) times daily.      hydroxychloroquine (PLAQUENIL) 200 MG tablet TAKE 2 TABLETS(400 MG) BY MOUTH EVERY DAY      levothyroxine (SYNTHROID) 150 MCG tablet Take 150 mcg by mouth daily.      lovastatin (MEVACOR) 20 MG tablet Take 20 mg by mouth every evening.      methotrexate (RHEUMATREX) 2.5 MG tablet Take 2.5 mg by mouth once a week.       metoprolol tartrate (LOPRESSOR) 25 MG tablet Take 25 mg by mouth 2 (two) times daily.       potassium chloride (KLOR-CON M) 10 MEQ tablet Take 10 mEq by mouth daily.      predniSONE (DELTASONE) 1 MG tablet Take by mouth.      TRADJENTA 5 MG TABS tablet Take 5 mg by mouth daily.      warfarin (COUMADIN) 10 MG tablet Take 10 mg by mouth daily.      furosemide (LASIX) 20 MG tablet Take 1 tablet (20 mg total) by mouth daily. 30 tablet 5    JARDIANCE 10 MG TABS tablet Take 10 mg by mouth daily.   unk at unk   levothyroxine (SYNTHROID, LEVOTHROID) 137 MCG tablet Take 137 mcg by mouth daily before breakfast. (Patient not taking: Reported on 05/10/2022)   Not Taking   predniSONE (DELTASONE) 5 MG tablet Take 5 mg by mouth daily with breakfast.  (Patient not taking: Reported on 05/10/2022)   Not Taking    Assessment: Pharmacy consulted to dose heparin in this 72 year old female  admitted for sepsis and hx of PAF and arterial thrombosis.   Pt was on warfarin 10 mg PO daily PTA  3/26:  INR @ 0327 = 2.8  Goal of Therapy:  Heparin level 0.3-0.7 units/ml Monitor platelets by anticoagulation protocol: Yes  Date Time: HL: Rate: 3/26 0932 0.13  Subtherapeutic/ 1700 > 2000 u/hr 3/26 1930 0.39 Therapeutic x1/ 2000 u/hr 3/26     2146   0.30     Therapeutic X 2 / 2000 un/hr 3/27     0214   0.36     Therapeutic X 3  3/28     0438   0.29     Subtherapeutic 3/28 1142 0.30 Therapeutic X 1 3/28 1921 0.30   Plan:  Heparin remains therapeutic - Will continue heparin drip rate at 2200 units/hr. - Will recheck HL with AM labs  Dorothe Pea, PharmD Clinical Pharmacist 05/12/2022 8:07 PM

## 2022-05-12 NOTE — Progress Notes (Signed)
ANTICOAGULATION CONSULT NOTE - Initial Consult  Pharmacy Consult for Heparin Indication:  arterial thrombosis  No Known Allergies  Patient Measurements: Height: 5\' 7"  (170.2 cm) Weight: (!) 172.5 kg (380 lb 4.7 oz) IBW/kg (Calculated) : 61.6 Heparin Dosing Weight: 104 kg   Vital Signs: Temp: 99 F (37.2 C) (03/28 0400) Temp Source: Oral (03/28 0400) BP: 79/52 (03/28 0600) Pulse Rate: 107 (03/28 0630)  Labs: Recent Labs    05/10/22 0327 05/10/22 0532 05/10/22 0932 05/10/22 1930 05/10/22 2122 05/10/22 2146 05/11/22 0214 05/11/22 0410 05/11/22 1042 05/11/22 1936 05/12/22 0357 05/12/22 0438 05/12/22 1142  HGB 15.2*  --   --   --  13.9  --   --   --   --   --  13.3  --   --   HCT 48.8*  --   --   --  42.2  --   --   --   --   --  39.9  --   --   PLT 217  --   --   --  159  --   --   --   --   --  171  --   --   APTT 31  --   --   --   --   --   --   --   --   --   --   --   --   LABPROT 29.1*  --   --   --   --  36.4*  --   --   --   --   --   --   --   INR 2.8*  --   --   --   --  3.7*  --   --   --   --   --   --   --   HEPARINUNFRC  --   --    < > 0.39  --  0.30 0.36  --   --   --   --  0.29* 0.30  CREATININE 1.94*  --    < >  --  1.32* 1.38*  --   --  1.17* 1.11* 0.95  --   --   CKTOTAL  --   --   --   --   --   --   --  2,587*  --   --   --   --   --   TROPONINIHS 38* 51*  --  63*  --   --   --   --   --   --   --   --   --    < > = values in this interval not displayed.     Estimated Creatinine Clearance: 93.5 mL/min (by C-G formula based on SCr of 0.95 mg/dL).   Medical History: Past Medical History:  Diagnosis Date   Arterial thrombosis ([place])    a. s/p thromboectomy and thrombolysis with left BKA in 2008 on chronic Coumadin    HLD (hyperlipidemia)    Hypertension    Hypothyroidism    Obesity    PAF (paroxysmal atrial fibrillation) ([place])    a. noted 12/19 in the setting of hypokalemia; b. CHADS2VASc 4 (HTN, age x 1, vascular disease, female); c. on  chronic Coumadin in the setting of prior arterial clot   Rheumatoid arthritis ([place])     Medications:  Medications Prior to Admission  Medication Sig Dispense Refill Last Dose   albuterol (PROVENTIL  HFA;VENTOLIN HFA) 108 (90 Base) MCG/ACT inhaler Inhale 2 puffs into the lungs every 6 (six) hours as needed for wheezing or shortness of breath.   prn at unk   fluticasone (FLONASE) 50 MCG/ACT nasal spray Place 1 spray into both nostrils daily as needed for allergies or rhinitis.   prn at unk   furosemide (LASIX) 40 MG tablet Take 40 mg by mouth daily.      gabapentin (NEURONTIN) 600 MG tablet Take 600 mg by mouth 3 (three) times daily.      hydroxychloroquine (PLAQUENIL) 200 MG tablet TAKE 2 TABLETS(400 MG) BY MOUTH EVERY DAY      levothyroxine (SYNTHROID) 150 MCG tablet Take 150 mcg by mouth daily.      lovastatin (MEVACOR) 20 MG tablet Take 20 mg by mouth every evening.      methotrexate (RHEUMATREX) 2.5 MG tablet Take 2.5 mg by mouth once a week.       metoprolol tartrate (LOPRESSOR) 25 MG tablet Take 25 mg by mouth 2 (two) times daily.       potassium chloride (KLOR-CON M) 10 MEQ tablet Take 10 mEq by mouth daily.      predniSONE (DELTASONE) 1 MG tablet Take by mouth.      TRADJENTA 5 MG TABS tablet Take 5 mg by mouth daily.      warfarin (COUMADIN) 10 MG tablet Take 10 mg by mouth daily.      furosemide (LASIX) 20 MG tablet Take 1 tablet (20 mg total) by mouth daily. 30 tablet 5    JARDIANCE 10 MG TABS tablet Take 10 mg by mouth daily.   unk at unk   levothyroxine (SYNTHROID, LEVOTHROID) 137 MCG tablet Take 137 mcg by mouth daily before breakfast. (Patient not taking: Reported on 05/10/2022)   Not Taking   predniSONE (DELTASONE) 5 MG tablet Take 5 mg by mouth daily with breakfast.  (Patient not taking: Reported on 05/10/2022)   Not Taking    Assessment: Pharmacy consulted to dose heparin in this 72 year old female admitted for sepsis and hx of PAF and arterial thrombosis.   Pt was on  warfarin 10 mg PO daily PTA  3/26:  INR @ 0327 = 2.8  Goal of Therapy:  Heparin level 0.3-0.7 units/ml Monitor platelets by anticoagulation protocol: Yes  Date Time: HL: Rate: 3/26 0932 0.13  Subtherapeutic/ 1700 > 2000 u/hr 3/26 1930 0.39 Therapeutic x1/ 2000 u/hr 3/26     2146   0.30     Therapeutic X 2 / 2000 un/hr 3/27     0214   0.36     Therapeutic X 3  3/28     0438   0.29     Subtherapeutic 3/28 1142 0.30 Therapeutic X 1  Plan:  - Therapeutic x 1 - Will continue heparin drip rate at 2200 units/hr. - Will recheck confirmatory HL in 6 hrs  Pearla Dubonnet, PharmD Clinical Pharmacist 05/12/2022 3:23 PM

## 2022-05-12 NOTE — Consult Note (Signed)
PHARMACY CONSULT NOTE - FOLLOW UP  Pharmacy Consult for Electrolyte Monitoring and Replacement   Recent Labs: Potassium (mmol/L)  Date Value  05/12/2022 3.5   Magnesium (mg/dL)  Date Value  05/12/2022 2.1   Calcium (mg/dL)  Date Value  05/12/2022 7.3 (L)   Albumin (g/dL)  Date Value  05/10/2022 2.7 (L)  02/19/2018 3.1 (L)   Phosphorus (mg/dL)  Date Value  05/12/2022 3.0   Sodium (mmol/L)  Date Value  05/12/2022 138  02/19/2018 137   Corrected Calcium: 8.34  Assessment: 72 yo F presenting [facility] ED from home via EMS on 05/10/22 for evaluation of altered mental status. Family and patient report that she was in her normal state of health until late in the evening on 05/09/22 when she began having significant chills. Then she became less responsive prompting family to call EMS. Pharmacy has been consulted to monitor and replace electrolytes while under PCCM care.  Goal of Therapy:  Electrolytes WNL  Plan:  No electrolyte replacement indicated at this time Recheck electrolytes with AM labs   Pearla Dubonnet ,PharmD Clinical Pharmacist 05/12/2022 7:38 AM

## 2022-05-12 NOTE — Progress Notes (Signed)
Progress Note  Patient Name: Kayla Reyes Date of Encounter: 05/12/2022  Primary Cardiologist: Fletcher Anon  Subjective   No chest pain or palpitations. Dyspnea stable. Remains on supplemental oxygen at 4 L via nasal cannula. BP in the [id number] systolic, on vasopressin. Remains in Afib with RVR on IV heparin and amiodarone. Was receiving IV Lopressor PRN.   Inpatient Medications    Scheduled Meds:  vitamin C  500 mg Per Tube BID   Chlorhexidine Gluconate Cloth  6 each Topical Q0600   docusate  100 mg Per Tube BID   free water  30 mL Per Tube Q4H   hydrocortisone sod succinate (SOLU-CORTEF) inj  100 mg Intravenous Daily   insulin aspart  0-15 Units Subcutaneous Q4H   levalbuterol  0.63 mg Nebulization Q6H   And   ipratropium  0.5 mg Nebulization Q6H   leptospermum manuka honey  1 Application Topical Daily   levothyroxine  137 mcg Per Tube QAC breakfast   lidocaine  5 mL Intradermal Once   liver oil-zinc oxide   Topical TID   metoCLOPramide (REGLAN) injection  10 mg Intravenous Q8H   mupirocin ointment  1 Application Nasal BID   pantoprazole (PROTONIX) IV  40 mg Intravenous Daily   polyethylene glycol  17 g Per Tube Daily   sodium chloride flush  10-40 mL Intracatheter Q12H   sodium phosphate  1 enema Rectal Once   thiamine (VITAMIN B1) injection  100 mg Intravenous Daily   Continuous Infusions:  sodium chloride Stopped (05/10/22 1357)   amiodarone 60 mg/hr (05/12/22 0600)   fluconazole (DIFLUCAN) IV 100 mL/hr at 05/11/22 1700   heparin 2,200 Units/hr (05/12/22 0619)   meropenem (MERREM) IV Stopped (05/12/22 0532)   norepinephrine (LEVOPHED) Adult infusion Stopped (05/12/22 0406)   potassium chloride 10 mEq (05/12/22 [id number])   vancomycin Stopped (05/11/22 1621)   vasopressin 0.03 Units/min (05/12/22 0600)   PRN Meds: acetaminophen **OR** acetaminophen, ondansetron (ZOFRAN) IV, sodium chloride flush   Vital Signs    Vitals:   05/12/22 0600 05/12/22 0615 05/12/22 0630 05/12/22  0732  BP: (!) 79/52     Pulse: (!) 145 (!) 151 (!) 107   Resp: (!) 25 (!) 23 (!) 22   Temp:      TempSrc:      SpO2: 97% 99% 99% 98%  Weight:      Height:        Intake/Output Summary (Last 24 hours) at 05/12/2022 0824 Last data filed at 05/12/2022 [id number] Gross per 24 hour  Intake 3347.32 ml  Output 2390 ml  Net 957.32 ml   Filed Weights   05/10/22 0322 05/11/22 0709  Weight: (!) 167.1 kg (!) 172.5 kg    Telemetry    Redeveloped Afib at 8:45 on 3/27 and has remained in Afib since, currently in Afib with RVR with rates in the 130s to 140s bpm - Personally Reviewed  ECG    No new tracings - Personally Reviewed  Physical Exam   GEN: No acute distress.   Neck: JVD difficult to assess secondary to body habitus. Cardiac: Tachycardic, IRIR, no murmurs, rubs, or gallops.  Respiratory: Diminished breath sounds bilaterally.  GI: Soft, nontender, non-distended.   MS: No edema; No deformity. Neuro:  Alert and oriented x 3; Nonfocal.  Psych: Normal affect.  Labs    Chemistry Recent Labs  Lab 05/10/22 0327 05/10/22 1437 05/10/22 2122 05/10/22 2146 05/11/22 1042 05/11/22 1936 05/12/22 0357  NA 138 133* 137   < > 138  135 138  K 3.2* 3.7 3.6   < > 3.4* 3.9 3.5  CL 108 103 105   < > 106 103 105  CO2 19* 15* 21*   < > 21* 24 26  GLUCOSE 159* 174* 167*   < > 142* 152* 125*  BUN 18 23 24*   < > 22 21 23   CREATININE 1.94* 1.71* 1.32*   < > 1.17* 1.11* 0.95  CALCIUM 8.1* 7.8* 7.9*   < > 7.4* 7.3* 7.3*  PROT 6.3*  --  5.7*  --   --   --   --   ALBUMIN 3.0* 2.6* 2.7*  --   --   --   --   AST 49*  --  73*  --   --   --   --   ALT 19  --  27  --   --   --   --   ALKPHOS 29*  --  29*  --   --   --   --   BILITOT 0.8  --  0.8  --   --   --   --   GFRNONAA 28* 32* 44*   < > 51* 54* >60  ANIONGAP 11 15 11    < > 11 8 7    < > = values in this interval not displayed.     Hematology Recent Labs  Lab 05/10/22 0327 05/10/22 2122 05/12/22 0357  WBC 4.4 12.0* 15.3*  RBC 5.03 4.56  4.31  HGB 15.2* 13.9 13.3  HCT 48.8* 42.2 39.9  MCV 97.0 92.5 92.6  MCH 30.2 30.5 30.9  MCHC 31.1 32.9 33.3  RDW 17.4* 17.2* 17.2*  PLT 217 159 171    Cardiac EnzymesNo results for input(s): "TROPONINI" in the last 168 hours. No results for input(s): "TROPIPOC" in the last 168 hours.   BNP Recent Labs  Lab 05/10/22 0327  BNP 103.2*     DDimer No results for input(s): "DDIMER" in the last 168 hours.   Radiology    DG Chest Port 1 View  Result Date: 05/12/2022 IMPRESSION: Stable cardiomediastinal silhouette with mild central pulmonary vascular congestion. Aortic Atherosclerosis (ICD10-I70.0). Electronically Signed   By: Marijo Conception M.D.   On: 05/12/2022 07:45   DG Abd 1 View  Result Date: 05/12/2022 IMPRESSION: Enteric tube tip overlies the level of the stomach. Nonobstructive bowel gas pattern. Electronically Signed   By: Nolon Nations M.D.   On: 05/12/2022 07:44   DG Chest Port 1 View  Result Date: 05/10/2022 IMPRESSION: 1. Right-sided PICC line tip projects over the lower SVC. No pneumothorax. 2. Accentuated right infrahilar opacity, while some of this may be vascular, I cannot exclude infrahilar pneumonia or atelectasis. 3. Borderline enlargement of the cardiopericardial silhouette. 4.  Aortic Atherosclerosis (ICD10-I70.0). Electronically Signed   By: Van Clines M.D.   On: 05/10/2022 18:33   CT ABDOMEN PELVIS WO CONTRAST  Result Date: 05/10/2022 IMPRESSION: 1. No acute findings within the abdomen or pelvis. 2. Large pannus with marked diffuse skin thickening and subcutaneous soft tissue stranding. Correlate for any clinical signs or symptoms of cellulitis. No discrete fluid collection identified within the pannus to suggest abscess. 3. Hepatic steatosis. 4. Gallstones. 5. Uterine fibroids. 6. Small fat containing umbilical hernia. 7. Laxity of the ventral abdominal wall with anterior and inferior herniation bowel loops. 8. Aortic Atherosclerosis (ICD10-I70.0).  Electronically Signed   By: Kerby Moors M.D.   On: 05/10/2022 13:02   DG Abd  Portable 1V  Result Date: 05/10/2022 IMPRESSION: NG tube tip and side port are in the stomach. Electronically Signed   By: Yetta Glassman M.D.   On: 05/10/2022 11:08    Cardiac Studies   2D echo 05/10/2022: 1. Left ventricular ejection fraction, by estimation, is 55 to 60%. The  left ventricle has normal function. The left ventricle has no regional  wall motion abnormalities. Left ventricular diastolic parameters were  normal.   2. Right ventricular systolic function is normal. The right ventricular  size is normal. There is normal pulmonary artery systolic pressure.   3. The mitral valve is normal in structure. No evidence of mitral valve  regurgitation. No evidence of mitral stenosis.   4. The aortic valve is normal in structure. Aortic valve regurgitation is  not visualized. Aortic valve sclerosis is present, with no evidence of  aortic valve stenosis.   Patient Profile     72 y.o. 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 Mortimer Fries.   Assessment & Plan    1. PAF with RVR: -Has previously spontaneously converted on 3/26, though redeveloped Afib at 8:45 on 3/27, which has persisted since, currently in Afib with RVR with ventricular rates in the 130s to 140s bpm - Septic shock requiring vasopressor support precludes addition of beta blocker or calcium channel blocker -Rebolus IV amiodarone  over 60 minutes per MD in an effort to minimize significatn hypotension -Give IV digoxin 0.25 mg once -Continue IV amiodarone -CHADS2VASc 5 -PTA warfarin per pharmacy, INR 3.7 on 3/26 -High risk for sedation with current respiratory status, may need to pursue DCCV if Afib with RVR persists despite the above measures -High risk for recurrent Afib in the setting of acute illness  2. Severe sepsis with shock: -Due to infected infected panus -Remains on vasopressin, no longer requiring Levophed -ABX per CCM  3. AKI: -Resolved  4. HFpEF: -Well compensated -Diurese as indicated  -Escalate GDMT as able throughout admission and in follow up  5. Hypokalemia: -Improving -IV repletion ordered  6. History of arterial thrombus: -Heparin gtt for now -Resume PTA warfarin prior to discharge        For questions or updates, please contact [place] Please consult [email or web address] for contact info under Cardiology/STEMI.    Signed, Christell Faith, PA-C [place] Pager: [phone] 05/12/2022, 8:24 AM

## 2022-05-13 ENCOUNTER — Inpatient Hospital Stay: Payer: Medicare HMO | Admitting: Anesthesiology

## 2022-05-13 ENCOUNTER — Encounter: Payer: Self-pay | Admitting: Internal Medicine

## 2022-05-13 ENCOUNTER — Encounter
Admission: EM | Disposition: A | Discharge status: Home / Self Care | Payer: Self-pay | Source: Home / Self Care | Attending: Internal Medicine

## 2022-05-13 DIAGNOSIS — A419 Sepsis, unspecified organism: Secondary | ICD-10-CM | POA: Diagnosis not present

## 2022-05-13 DIAGNOSIS — I959 Hypotension, unspecified: Secondary | ICD-10-CM | POA: Diagnosis not present

## 2022-05-13 DIAGNOSIS — I4891 Unspecified atrial fibrillation: Secondary | ICD-10-CM | POA: Diagnosis not present

## 2022-05-13 DIAGNOSIS — R509 Fever, unspecified: Secondary | ICD-10-CM | POA: Diagnosis not present

## 2022-05-13 HISTORY — PX: CARDIOVERSION: SHX1299

## 2022-05-13 LAB — CBC
HCT: 38.4 % (ref 36.0–46.0)
Hemoglobin: 12.6 g/dL (ref 12.0–15.0)
MCH: 30.4 pg (ref 26.0–34.0)
MCHC: 32.8 g/dL (ref 30.0–36.0)
MCV: 92.8 fL (ref 80.0–100.0)
Platelets: UNDETERMINED 10*3/uL (ref 150–400)
RBC: 4.14 MIL/uL (ref 3.87–5.11)
RDW: 17.2 % — ABNORMAL HIGH (ref 11.5–15.5)
WBC: 10.8 10*3/uL — ABNORMAL HIGH (ref 4.0–10.5)
nRBC: 0.2 % (ref 0.0–0.2)

## 2022-05-13 LAB — BASIC METABOLIC PANEL
Anion gap: 8 (ref 5–15)
BUN: 21 mg/dL (ref 8–23)
CO2: 25 mmol/L (ref 22–32)
Calcium: 7.7 mg/dL — ABNORMAL LOW (ref 8.9–10.3)
Chloride: 104 mmol/L (ref 98–111)
Creatinine, Ser: 0.84 mg/dL (ref 0.44–1.00)
GFR, Estimated: >60 mL/min (ref >60)
Glucose, Bld: 115 mg/dL — ABNORMAL HIGH (ref 70–99)
Potassium: 3.7 mmol/L (ref 3.5–5.1)
Sodium: 137 mmol/L (ref 135–145)

## 2022-05-13 LAB — HEPARIN LEVEL (UNFRACTIONATED): Heparin Unfractionated: 0.31 IU/mL (ref 0.30–0.70)

## 2022-05-13 LAB — LACTIC ACID, PLASMA
Lactic Acid, Venous: 2.1 mmol/L (ref 0.5–1.9)
Lactic Acid, Venous: 2.6 mmol/L (ref 0.5–1.9)

## 2022-05-13 LAB — GLUCOSE, CAPILLARY
Glucose-Capillary: 125 mg/dL — ABNORMAL HIGH (ref 70–99)
Glucose-Capillary: 107 mg/dL — ABNORMAL HIGH (ref 70–99)
Glucose-Capillary: 89 mg/dL (ref 70–99)
Glucose-Capillary: 107 mg/dL — ABNORMAL HIGH (ref 70–99)
Glucose-Capillary: 109 mg/dL — ABNORMAL HIGH (ref 70–99)

## 2022-05-13 LAB — PROTIME-INR
INR: 2.6 — ABNORMAL HIGH (ref 0.8–1.2)
Prothrombin Time: 27.7 seconds — ABNORMAL HIGH (ref 11.4–15.2)

## 2022-05-13 LAB — PHOSPHORUS: Phosphorus: 2.2 mg/dL — ABNORMAL LOW (ref 2.5–4.6)

## 2022-05-13 LAB — MAGNESIUM: Magnesium: 2.4 mg/dL (ref 1.7–2.4)

## 2022-05-13 SURGERY — CARDIOVERSION
Anesthesia: Monitor Anesthesia Care

## 2022-05-13 MED ORDER — ETOMIDATE 2 MG/ML IV SOLN
INTRAVENOUS | Status: AC
  Filled 2022-05-13: qty 10

## 2022-05-13 MED ORDER — MAGNESIUM SULFATE 2 GM/50ML IV SOLN
2.0000 g | Freq: Once | INTRAVENOUS | Status: AC
  Administered 2022-05-13: 2 g via INTRAVENOUS
  Filled 2022-05-13: qty 50

## 2022-05-13 MED ORDER — POTASSIUM & SODIUM PHOSPHATES 280-160-250 MG PO PACK
2.0000 | PACK | ORAL | Status: AC
  Administered 2022-05-13 (×2): 2
  Filled 2022-05-13 (×2): qty 2

## 2022-05-13 MED ORDER — LEVALBUTEROL HCL 0.63 MG/3ML IN NEBU
0.6300 mg | INHALATION_SOLUTION | Freq: Three times a day (TID) | RESPIRATORY_TRACT | Status: DC
  Administered 2022-05-14 – 2022-05-17 (×10): 0.63 mg via RESPIRATORY_TRACT
  Filled 2022-05-13 (×10): qty 3

## 2022-05-13 MED ORDER — ETOMIDATE 2 MG/ML IV SOLN
INTRAVENOUS | Status: DC | PRN
  Administered 2022-05-13: 8 mg via INTRAVENOUS

## 2022-05-13 MED ORDER — SODIUM CHLORIDE 0.9 % IV SOLN: INTRAVENOUS | Status: DC

## 2022-05-13 MED ORDER — IPRATROPIUM BROMIDE 0.02 % IN SOLN
0.5000 mg | Freq: Three times a day (TID) | RESPIRATORY_TRACT | Status: DC
  Administered 2022-05-14 – 2022-05-17 (×10): 0.5 mg via RESPIRATORY_TRACT
  Filled 2022-05-13 (×10): qty 2.5

## 2022-05-13 MED ORDER — HYDROXYCHLOROQUINE SULFATE 200 MG PO TABS
400.0000 mg | ORAL_TABLET | Freq: Every day | ORAL | Status: DC
  Administered 2022-05-13 – 2022-05-19 (×7): 400 mg via ORAL
  Filled 2022-05-13 (×7): qty 2

## 2022-05-13 MED ORDER — METOPROLOL TARTRATE 25 MG PO TABS
25.0000 mg | ORAL_TABLET | Freq: Two times a day (BID) | ORAL | Status: DC
  Administered 2022-05-13 – 2022-05-19 (×11): 25 mg via ORAL
  Filled 2022-05-13 (×12): qty 1

## 2022-05-13 NOTE — Progress Notes (Signed)
Progress Note  Patient Name: Kayla Reyes Date of Encounter: 05/13/2022  Primary Cardiologist: Fletcher Anon  Subjective   No chest pain or palpitations. Dyspnea slightly improved. Remains on supplemental oxygen at 4 L via nasal cannula. BP in the [id number] systolic, on vasopressin. Remains in Afib with RVR with rates in the 140s bpm despite reloading with IV amiodarone and IV digoxin yesterday. On IV heparin.    Inpatient Medications    Scheduled Meds:  vitamin C  500 mg Per Tube BID   Chlorhexidine Gluconate Cloth  6 each Topical Q0600   docusate  100 mg Per Tube BID   feeding supplement (VITAL 1.5 CAL)  1,000 mL Per Tube Q24H   free water  30 mL Per Tube Q4H   hydrocortisone sod succinate (SOLU-CORTEF) inj  100 mg Intravenous Daily   insulin aspart  0-15 Units Subcutaneous Q4H   levalbuterol  0.63 mg Nebulization Q6H   And   ipratropium  0.5 mg Nebulization Q6H   leptospermum manuka honey  1 Application Topical Daily   levothyroxine  137 mcg Per Tube QAC breakfast   lidocaine  5 mL Intradermal Once   liver oil-zinc oxide   Topical TID   metoCLOPramide (REGLAN) injection  10 mg Intravenous Q8H   mupirocin ointment  1 Application Nasal BID   ondansetron (ZOFRAN) IV  4 mg Intravenous Q6H   pantoprazole (PROTONIX) IV  40 mg Intravenous Daily   polyethylene glycol  17 g Per Tube Daily   sodium chloride flush  10-40 mL Intracatheter Q12H   thiamine (VITAMIN B1) injection  100 mg Intravenous Daily   Continuous Infusions:  sodium chloride Stopped (05/10/22 1357)   amiodarone 60 mg/hr (05/13/22 0520)   fluconazole (DIFLUCAN) IV 100 mL/hr at 05/12/22 1800   heparin 2,200 Units/hr (05/13/22 0520)   linezolid (ZYVOX) IV 600 mg (05/12/22 2158)   norepinephrine (LEVOPHED) Adult infusion Stopped (05/13/22 0150)   promethazine (PHENERGAN) injection (IM or IVPB) 200 mL/hr at 05/13/22 0520   vasopressin 0.04 Units/min (05/13/22 0520)   PRN Meds: acetaminophen **OR** acetaminophen, promethazine  (PHENERGAN) injection (IM or IVPB), sodium chloride flush   Vital Signs    Vitals:   05/13/22 0600 05/13/22 0615 05/13/22 0630 05/13/22 0645  BP:      Pulse:      Resp: (!) 22 (!) 25 18 12   Temp:      TempSrc:      SpO2:      Weight:      Height:        Intake/Output Summary (Last 24 hours) at 05/13/2022 0742 Last data filed at 05/13/2022 0514 Gross per 24 hour  Intake 3002.53 ml  Output 1780 ml  Net 1222.53 ml    Filed Weights   05/10/22 0322 05/11/22 0709  Weight: (!) 167.1 kg (!) 172.5 kg    Telemetry    Afib with RVR with rates in the 130s to 140s bpm - Personally Reviewed  ECG    No new tracings - Personally Reviewed  Physical Exam   GEN: No acute distress.   Neck: JVD difficult to assess secondary to body habitus. Cardiac: Tachycardic, IRIR, no murmurs, rubs, or gallops.  Respiratory: Diminished breath sounds bilaterally.  GI: Soft, nontender, non-distended.   MS: No edema; No deformity. Neuro:  Alert and oriented x 3; Nonfocal.  Psych: Normal affect.  Labs    Chemistry Recent Labs  Lab 05/10/22 0327 05/10/22 1437 05/10/22 2122 05/10/22 2146 05/11/22 1936 05/12/22 0357 05/12/22 2259  NA 138 133* 137   < > 135 138 135  K 3.2* 3.7 3.6   < > 3.9 3.5 3.4*  CL 108 103 105   < > 103 105 104  CO2 19* 15* 21*   < > 24 26 23   GLUCOSE 159* 174* 167*   < > 152* 125* 171*  BUN 18 23 24*   < > 21 23 20   CREATININE 1.94* 1.71* 1.32*   < > 1.11* 0.95 0.87  CALCIUM 8.1* 7.8* 7.9*   < > 7.3* 7.3* 7.1*  PROT 6.3*  --  5.7*  --   --   --   --   ALBUMIN 3.0* 2.6* 2.7*  --   --   --   --   AST 49*  --  73*  --   --   --   --   ALT 19  --  27  --   --   --   --   ALKPHOS 29*  --  29*  --   --   --   --   BILITOT 0.8  --  0.8  --   --   --   --   GFRNONAA 28* 32* 44*   < > 54* >60 >60  ANIONGAP 11 15 11    < > 8 7 8    < > = values in this interval not displayed.      Hematology Recent Labs  Lab 05/10/22 0327 05/10/22 2122 05/12/22 0357  WBC 4.4 12.0*  15.3*  RBC 5.03 4.56 4.31  HGB 15.2* 13.9 13.3  HCT 48.8* 42.2 39.9  MCV 97.0 92.5 92.6  MCH 30.2 30.5 30.9  MCHC 31.1 32.9 33.3  RDW 17.4* 17.2* 17.2*  PLT 217 159 171     Cardiac EnzymesNo results for input(s): "TROPONINI" in the last 168 hours. No results for input(s): "TROPIPOC" in the last 168 hours.   BNP Recent Labs  Lab 05/10/22 0327  BNP 103.2*      DDimer No results for input(s): "DDIMER" in the last 168 hours.   Radiology    DG Chest Port 1 View  Result Date: 05/12/2022 IMPRESSION: Stable cardiomediastinal silhouette with mild central pulmonary vascular congestion. Aortic Atherosclerosis (ICD10-I70.0). Electronically Signed   By: Marijo Conception M.D.   On: 05/12/2022 07:45   DG Abd 1 View  Result Date: 05/12/2022 IMPRESSION: Enteric tube tip overlies the level of the stomach. Nonobstructive bowel gas pattern. Electronically Signed   By: Nolon Nations M.D.   On: 05/12/2022 07:44   DG Chest Port 1 View  Result Date: 05/10/2022 IMPRESSION: 1. Right-sided PICC line tip projects over the lower SVC. No pneumothorax. 2. Accentuated right infrahilar opacity, while some of this may be vascular, I cannot exclude infrahilar pneumonia or atelectasis. 3. Borderline enlargement of the cardiopericardial silhouette. 4.  Aortic Atherosclerosis (ICD10-I70.0). Electronically Signed   By: Van Clines M.D.   On: 05/10/2022 18:33   CT ABDOMEN PELVIS WO CONTRAST  Result Date: 05/10/2022 IMPRESSION: 1. No acute findings within the abdomen or pelvis. 2. Large pannus with marked diffuse skin thickening and subcutaneous soft tissue stranding. Correlate for any clinical signs or symptoms of cellulitis. No discrete fluid collection identified within the pannus to suggest abscess. 3. Hepatic steatosis. 4. Gallstones. 5. Uterine fibroids. 6. Small fat containing umbilical hernia. 7. Laxity of the ventral abdominal wall with anterior and inferior herniation bowel loops. 8. Aortic  Atherosclerosis (ICD10-I70.0). Electronically Signed   By: Lovena Le  Clovis Riley M.D.   On: 05/10/2022 13:02   DG Abd Portable 1V  Result Date: 05/10/2022 IMPRESSION: NG tube tip and side port are in the stomach. Electronically Signed   By: Yetta Glassman M.D.   On: 05/10/2022 11:08    Cardiac Studies   2D echo 05/10/2022: 1. Left ventricular ejection fraction, by estimation, is 55 to 60%. The  left ventricle has normal function. The left ventricle has no regional  wall motion abnormalities. Left ventricular diastolic parameters were  normal.   2. Right ventricular systolic function is normal. The right ventricular  size is normal. There is normal pulmonary artery systolic pressure.   3. The mitral valve is normal in structure. No evidence of mitral valve  regurgitation. No evidence of mitral stenosis.   4. The aortic valve is normal in structure. Aortic valve regurgitation is  not visualized. Aortic valve sclerosis is present, with no evidence of  aortic valve stenosis.   Patient Profile     72 y.o. 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 Mortimer Fries.   Assessment & Plan    1. PAF with RVR: -Has previously spontaneously converted on 3/26, though redeveloped Afib at 8:45 on 3/27, which has persisted since, currently in Afib with RVR with ventricular rates in the 130s to 140s bpm - Septic shock requiring vasopressor support precludes addition of beta blocker or calcium channel  blocker -Despite reloading with IV amiodarone and IV digoxin on 3/28, she remains in Afib with RVR with rates in the 140s bpm -Continue IV amiodarone -CHADS2VASc 5 -PTA warfarin per pharmacy, INR 3.7 on 3/26 -NPO -Unable to escalate medical therapy/rate control given relative hypotension -Did not respond to medical therapy yesterday -Pursue DCCV with Dr. Rockey Situ at bedside -High risk for recurrent Afib in the setting of acute illness  2. Severe sepsis with shock: -Due to infected infected panus -Remains on vasopressin, no longer requiring Levophed -ABX per CCM  3. AKI: -Resolved  4. HFpEF: -Well compensated -Diurese as indicated  -Escalate GDMT as able throughout admission and in follow up  5. Hypokalemia: -Improving -IV repletion ordered  6. History of arterial thrombus: -Heparin gtt for now -Resume PTA warfarin prior to discharge     Shared Decision Making/Informed Consent{  The risks (stroke, cardiac arrhythmias rarely resulting in the need for a temporary or permanent pacemaker, skin irritation or burns and complications associated with conscious sedation including aspiration, arrhythmia, respiratory failure and death), benefits (restoration of normal sinus rhythm) and alternatives of a direct current cardioversion were explained in detail to Ms. Woolstenhulme and she agrees to proceed.        For questions or updates, please contact [place] Please consult [email or web address] for contact info under Cardiology/STEMI.    Signed, Christell Faith, PA-C [facility] Pager: [phone] 05/13/2022, 7:42 AM

## 2022-05-13 NOTE — Evaluation (Signed)
Clinical/Bedside Swallow Evaluation Patient Details  Name: Kayla Reyes MRN: [id number] Date of Birth: 06-05-1950  Today's Date: 05/13/2022 Time: SLP Start Time (ACUTE ONLY): 42 SLP Stop Time (ACUTE ONLY): 45 SLP Time Calculation (min) (ACUTE ONLY): 55 min  Past Medical History:  Past Medical History:  Diagnosis Date   Arterial thrombosis ([place])    a. s/p thromboectomy and thrombolysis with left BKA in 2008 on chronic Coumadin    HLD (hyperlipidemia)    Hypertension    Hypothyroidism    Obesity    PAF (paroxysmal atrial fibrillation) ([place])    a. noted 12/19 in the setting of hypokalemia; b. CHADS2VASc 4 (HTN, age x 1, vascular disease, female); c. on chronic Coumadin in the setting of prior arterial clot   Rheumatoid arthritis ([place])    Past Surgical History:  Past Surgical History:  Procedure Laterality Date   BELOW KNEE LEG AMPUTATION     HPI:  Pt is a 72 y/o female with h/o HTN, Obesity(BMI 59.56), L AKA, hypothyroidism, RA, thrombosis of aorta and renal artery, PAF and CHF who is admitted with sepsis, infected panus, AKI and CHF excerbation. NGT connected to low, intermittent suction currently. Per RN, TF were stopped yesterday(05/12/22) secondary to vomiting and NGT placed back to suction. RN reports pt has not had any further vomiting today. She does not wear oxygen at home, and is bed-bound.  She lives with family who assist her in her needs. She endorses a wound on her "backside" that her sister cleans.  CXR: Stable cardiomediastinal silhouette with mild central pulmonary  vascular congestion.     Aortic Atherosclerosis    Assessment / Plan / Recommendation  Clinical Impression   Pt seen for BSE today. Son present in room. Pt awake, verbal and engaged w/ family and this SLP. A/Ox4.  Large bore NGT in place which pt stated she could "feel" when she swallowed; endorsed it was "irritating slightly". Pt has been having N/V per chart notes. Dietician following. Pt admitted w/  sepsis, has wound baseline. Bedbound at home per chart. [place] O2 3L; afebrile. WBC 10.8  Pt appears to present w/ grossly adequate and functional oropharyngeal phase swallowing w/ No overt oropharyngeal phase dysphagia noted, No neuromuscular deficits noted. Pt consumed po trials w/ No immediate, overt, clinical s/s of aspiration during po trials. Pt appears at reduced risk for aspiration following general aspiration precautions and when given support w/ feeding d/t deconditioned and bedbound status'.  OF NOTE: Pt and Son denied pt having any swallowing problems/issues at home prior to this admit.  However, pt does have challenging factors that could impact her oropharyngeal swallowing to include slight discomfort when swallowing from Large bore NGT in place, a Large bore NGT present in the oropharyngeal-esophageal space, deconditioned/weakness, N/V episodes, and bedbound status. These factors can increase risk for dysphagia, aspiration as well as decreased oral intake overall.   During po trials, pt consumed all consistencies w/ no overt coughing, decline in vocal quality, or change in respiratory presentation during/post trials. O2 sats remained 98%. Oral phase appeared [facility] w/ timely bolus management and control of bolus propulsion for A-P transfer for swallowing. Oral clearing achieved w/ all trial consistencies. No solid foods given d/t presence of Large bore NGT.  OM Exam appeared [facility] w/ no unilateral weakness noted. Speech Clear. Pt helped to feed self w/ setup support.   Recommend a Full Liquid diet consistency while Large bore NGT is in place d/t it's potential impact on pharyngo-esophageal swallowing. When the  Large bore NGT is removed, a more mech soft consistency diet w/ well-Cut meats, moistened foods for ease of swallowing; Thin liquids. Pt should participate in self-feeding at meals. Recommend general aspiration precautions, Reflux precautions. Tray setup and positioning support for upright w/  head forward. Pills CRUSHED vs WHOLE in Puree for safer, easier swallowing, esp. W/ NGT in place.  Education given on Pills in Puree; food consistencies and easy to eat options as diet consistency upgrades; general aspiration precautions to pt and Son. NSG/MD updated. No further skilled ST services indicated currently as pt appears at/near her baseline re: swallowing in setting of Large bore NGT. MD to reconsult if any new needs arise. MD agreed. Ongoing Dietician f/u for support. SLP Visit Diagnosis: Dysphagia, unspecified (R13.10) (large bore NGT present; N/V)    Aspiration Risk   (reduced following general precs.)    Diet Recommendation   ecommend a Full Liquid diet consistency while Large bore NGT is in place d/t it's potential impact on pharyngo-esophageal swallowing. When the Large bore NGT is removed, a more mech soft consistency diet w/ well-Cut meats, moistened foods for ease of swallowing; Thin liquids. Pt should participate in self-feeding at meals. Recommend general aspiration precautions, Reflux precautions. Tray setup and positioning support for upright w/ head forward.   Medication Administration: Crushed with puree (while NGT present)    Other  Recommendations Recommended Consults: Consider GI evaluation (Dietician following) Oral Care Recommendations: Oral care BID;Oral care before and after PO;Patient independent with oral care (support)    Recommendations for follow up therapy are one component of a multi-disciplinary discharge planning process, led by the attending physician.  Recommendations may be updated based on patient status, additional functional criteria and insurance authorization.  Follow up Recommendations No SLP follow up      Assistance Recommended at Discharge  PRN-intermittent for positioning in bed when eating/drinking  Functional Status Assessment Patient has not had a recent decline in their functional status  Frequency and Duration  (n/a)   (n/a)        Prognosis Prognosis for improved oropharyngeal function: Good Barriers to Reach Goals: Time post onset;Severity of deficits Barriers/Prognosis Comment: N/V; large bore NGT present      Swallow Study   General Date of Onset: 05/10/22 HPI: Pt is a 72 y/o female with h/o HTN, Obesity(BMI 59.56), L AKA, hypothyroidism, RA, thrombosis of aorta and renal artery, PAF and CHF who is admitted with sepsis, infected panus, AKI and CHF excerbation. NGT connected to low, intermittent suction currently. Per RN, TF were stopped yesterday(05/12/22) secondary to vomiting and NGT placed back to suction. RN reports pt has not had any further vomiting today. She does not wear oxygen at home, and is bed-bound.  She lives with family who assist her in her needs. She endorses a wound on her "backside" that her sister cleans.  CXR: Stable cardiomediastinal silhouette with mild central pulmonary  vascular congestion.     Aortic Atherosclerosis Type of Study: Bedside Swallow Evaluation Previous Swallow Assessment: none Diet Prior to this Study: NPO;Large bore NG tube Temperature Spikes Noted: No (wbc 10.8) Respiratory Status: Nasal cannula (3L) History of Recent Intubation: No Behavior/Cognition: Alert;Cooperative;Pleasant mood Oral Cavity Assessment: Within Functional Limits Oral Care Completed by SLP: Yes Oral Cavity - Dentition: Adequate natural dentition;Missing dentition (few) Vision: Functional for self-feeding Self-Feeding Abilities: Able to feed self;Needs set up;Needs assist (weak) Patient Positioning: Upright in bed (needed positioning support) Baseline Vocal Quality: Normal Volitional Cough: Strong Volitional Swallow: Able to elicit  Oral/Motor/Sensory Function Overall Oral Motor/Sensory Function: Within functional limits   Ice Chips Ice chips: Within functional limits Presentation: Spoon (fed; 3 trials)   Thin Liquid Thin Liquid: Within functional limits Presentation: Self Fed;Straw (10  trials) Other Comments: stated she could "feel" the NGT when swallowing and grimaced x2    Nectar Thick Nectar Thick Liquid: Not tested   Honey Thick Honey Thick Liquid: Not tested   Puree Puree: Within functional limits Presentation: Spoon (fed; 8 trials) Other Comments: stated she could "feel" the NGT when swallowing and grimaced x1   Solid     Solid: Not tested Other Comments: NGT present         Orinda Kenner, MS, CCC-SLP Speech Language Pathologist Rehab Services; [place] [phone] (ascom) Ramia Sidney 05/13/2022,4:47 PM

## 2022-05-13 NOTE — Anesthesia Preprocedure Evaluation (Signed)
Anesthesia Evaluation  Patient identified by MRN, date of birth, ID band Patient awake    Reviewed: Allergy & Precautions, H&P , NPO status , Patient's Chart, lab work & pertinent test results, reviewed documented beta blocker date and time   History of Anesthesia Complications Negative for: history of anesthetic complications  Airway Mallampati: III  TM Distance: >3 FB Neck ROM: full    Dental  (+) Chipped, Poor Dentition   Pulmonary shortness of breath and with exertion, sleep apnea , neg recent URI, former smoker Currently on O2, but does not use at home   Pulmonary exam normal breath sounds clear to auscultation       Cardiovascular Exercise Tolerance: Good hypertension, (-) angina + Peripheral Vascular Disease (s/p BKA x2)  (-) Past MI and (-) Cardiac Stents + dysrhythmias Atrial Fibrillation (-) Valvular Problems/Murmurs Rhythm:regular Rate:Normal     Neuro/Psych negative neurological ROS  negative psych ROS   GI/Hepatic negative GI ROS, Neg liver ROS,,,  Endo/Other  neg diabetesHypothyroidism  Morbid obesity  Renal/GU ARFRenal disease  negative genitourinary   Musculoskeletal   Abdominal   Peds  Hematology negative hematology ROS (+)   Anesthesia Other Findings Past Medical History: No date: Arterial thrombosis ([facility])     Comment:  a. s/p thromboectomy and thrombolysis with left BKA in               2008 on chronic Coumadin  No date: HLD (hyperlipidemia) No date: Hypertension No date: Hypothyroidism No date: Obesity No date: PAF (paroxysmal atrial fibrillation) ([facility])     Comment:  a. noted 12/19 in the setting of hypokalemia; b.               CHADS2VASc 4 (HTN, age x 1, vascular disease, female); c.              on chronic Coumadin in the setting of prior arterial clot No date: Rheumatoid arthritis ([facility])   Reproductive/Obstetrics negative OB ROS                              Anesthesia Physical Anesthesia Plan  ASA: 4  Anesthesia Plan: General and MAC   Post-op Pain Management:    Induction: Intravenous  PONV Risk Score and Plan: 3 and TIVA and Treatment may vary due to age or medical condition  Airway Management Planned: Natural Airway and Simple Face Mask  Additional Equipment:   Intra-op Plan:   Post-operative Plan:   Informed Consent: I have reviewed the patients History and Physical, chart, labs and discussed the procedure including the risks, benefits and alternatives for the proposed anesthesia with the patient or authorized representative who has indicated his/her understanding and acceptance.     Dental Advisory Given  Plan Discussed with: Anesthesiologist, CRNA and Surgeon  Anesthesia Plan Comments:         Anesthesia Quick Evaluation

## 2022-05-13 NOTE — Progress Notes (Signed)
NAMEMailen Reyes, MRN:  [id number], DOB:  [birth year], LOS: 3 ADMISSION DATE:  05/10/2022, CONSULTATION DATE:  05/10/22 REFERRING MD:  Dr. Beather Arbour, CHIEF COMPLAINT:  Unresponsive   History of Present Illness:  72 yo F presenting to [facility] ED from home via EMS on 05/10/22 for evaluation of altered mental status.  History provided by son bedside with assistance intermittently from the patient who remains lethargic. Family and patient report that she was in her normal state of health until late in the evening on 05/09/22 when she began having significant chills. Then she became less responsive prompting family to call EMS. Patient denies pain anywhere, fever, urinary symptoms, dyspnea, chest pain, nausea/ vomiting/ diarrhea. She is audibly wheezing and appears mildly dyspneic but both the patient and the son report this as baseline. They also endorse a chronic productive cough. She does not wear oxygen at home, and is bed-bound. She lives with family who assist her in her needs. She endorses a wound on her "backside" that her sister cleans. Her son bedside denies being told of any changes to this wound recently. She has a remote smoking history, having quit in 2008. She denies any ETOH use/ recreational drug use. She is being treated by rheumatology for RA. She has been taking all medication as prescribed including her warfarin.  ED course: Upon arrival patient febrile, tachypneic & tachycardic with labile blood pressures meeting sepsis criteria. Empiric antibiotics ordered as well as 2 L IVF bolus- patient's 30 mL/kg would be closer to 6 L but due to the patient's respiratory status, EDP chose to be conservative. Lab work significant for lactic acidosis, hypokalemia, AKI, NAGMA, mild transaminitis, elevated INR, mildly elevated troponin & BNP. BP continues to be marginal and PCCM consulted for admission due to concern for the need of vasopressor support.  Medications given: Tylenol, cefepime/vancomycin/  flagyl, 2 L LR bolus  Initial Vitals: 102.7, 30, 125, 104/88 & 92 on RA EKG Interpretation: Date: 05/10/22, EKG Time: 03:23, Rate: 125, Rhythm: ST, QRS Axis:  borderline RAD, Intervals: prolonged QTc, ST/T Wave abnormalities: non-specific T wave abnormalities, Narrative Interpretation: ST with prolonged Qtc and non-specific T wave abnormalities Chemistry: Na+: 138, K+: 3.2, BUN/Cr.: 18/ 1.94, Serum CO2/ AG: 19/11, AST: 49 Hematology: WBC: 4.4, Hgb: 15.2,  Troponin: 38, BNP: 103.2, Lactic/ PCT: 8.6/ pending, COVID-19 & Influenza A/B: pending  ABG: 7.4/ 27/ 71/ 16.7 CXR 05/10/22: generalized interstitial opacity favoring edema CT head wo contrast 05/10/22: no acute finding  PCCM consulted for admission due to severe sepsis secondary to unknown etiology requiring peripheral vasopressor support.  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 3/27 remains on pressors 3/29: reloaded on IV amiodarone and IV digoxin; remains in Afib with RVR   Interim History / Subjective:  Remains on levophed Afib with RVR DCCV today   Objective   Blood pressure (!) 81/66, pulse (!) 130, temperature 98.5 F (36.9 C), temperature source Oral, resp. rate 12, height 5\' 7"  (1.702 m), weight (!) 172.5 kg, SpO2 98 %.        Intake/Output Summary (Last 24 hours) at 05/13/2022 0821 Last data filed at 05/13/2022 0514 Gross per 24 hour  Intake  3002.53 ml  Output 1780 ml  Net 1222.53 ml   Filed Weights   05/10/22 0322 05/11/22 0709  Weight: (!) 167.1 kg (!) 172.5 kg       Review of Systems: Gen:  Denies  fever, sweats, chills  weight loss  Other:  All other systems negative   Physical Examination:   General Appearance: No distress  EYES PERRLA, EOM intact.   NECK Supple, No JVD Pulmonary: normal breath sounds, No wheezing.  CardiovascularNormal S1,S2.  No m/r/g.   Abdomen: Benign, Soft, non-tender. Neurology UE/LE 5/5 strength, no focal deficits Ext pulses intact, cap refill intact ALL OTHER ROS ARE NEGATIVE   Assessment & Plan:  Severe Sepsis with shock due infected PANUS Lactic: 8.6, Baseline PCT: pending, UA: +glucose + small Hgb, CXR: generalized interstitial opacity favoring edema  - Supplemental oxygen as needed, to maintain SpO2 > 90% - f/u cultures, trend lactic/ PCT - broad empiric IV antibiotics due to immunosuppressant use outpt: cefepime / vancomycin / flagyl/Fluconazole -conservative IVF hydration due to respiratory status  vasopressors to maintain MAP< 65: norepinephrine and vaso - try to wean vaso and norepi to OFF  RENAL -continue Foley Catheter-assess need -Avoid nephrotoxic agents -Follow urine output, BMP -Ensure adequate renal perfusion, optimize oxygenation -Renal dose medications   Intake/Output Summary (Last 24 hours) at 05/13/2022 [id number] Last data filed at 05/13/2022 [id number] Gross per 24 hour  Intake 3002.53 ml  Output 1780 ml  Net 1222.53 ml   Chronic Rheumatoid Arthritis - re-start daily Plaquenil - hold methotrexate due to suspected sepsis, hold gabapentin due to lethargy > consider restarting as patient stabilizes - continue solumedrol  Chronic Anticoagulation UNCONTROLLED AFIB HFpEF with acute exacerbation  Mildly Elevated Troponin s/t demand ischemia  PMHx: PAF, Arterial Thrombus, HLD - initiate heparin per pharmacy protocol  - diurese with lasix as hemodynamics and renal fxn allow > hold for now - trend troponin to peak - DCCV today  ENDO - ICU hypoglycemic\Hyperglycemia protocol - check FSBS per protocol   GI GI PROPHYLAXIS as indicated  NUTRITIONAL  STATUS DIET--> as tolerated Formal speech eval ordered  Constipation protocol as indicated  ELECTROLYTES -follow labs as needed -replace as needed -pharmacy consultation and following  Best Practice (right click and "Reselect all SmartList Selections" daily)  Diet/type: NPO DVT prophylaxis: systemic heparin GI prophylaxis: PPI Lines: N/A Foley:  N/A Code Status:  full code   Labs   CBC: Recent Labs  Lab 05/10/22 0327 05/10/22 2122 05/12/22 0357  WBC 4.4 12.0* 15.3*  NEUTROABS 3.5  --   --   HGB 15.2* 13.9 13.3  HCT 48.8* 42.2 39.9  MCV 97.0 92.5 92.6  PLT 217 159 [id number]    Basic Metabolic Panel: Recent Labs  Lab 05/10/22 0532 05/10/22 1437 05/10/22 2122 05/10/22 2146 05/11/22 1042 05/11/22 1936 05/12/22 0357 05/12/22 2259  NA  --  133* 137 134* 138 135 138 135  K  --  3.7 3.6 3.6 3.4* 3.9 3.5 3.4*  CL  --  103 105 106 106 103 105 104  CO2  --  15* 21* 18* 21* 24 26 23   GLUCOSE  --  174* 167* 183* 142* 152* 125* 171*  BUN  --  23 24* 23 22 21 23 20   CREATININE  --  1.71* 1.32* 1.38* 1.17* 1.11* 0.95 0.87  CALCIUM  --  7.8* 7.9* 7.5* 7.4* 7.3* 7.3* 7.1*  MG 1.6* 1.8 2.1  --   --  2.1 2.1 1.9  PHOS 3.1 4.0  4.0  --   --  3.4 3.0  --    GFR: Estimated Creatinine Clearance: 102.1 mL/min (by C-G formula based on SCr of 0.87 mg/dL). Recent Labs  Lab 05/10/22 0327 05/10/22 0532 05/10/22 0932 05/10/22 1437 05/10/22 2122 05/11/22 0410 05/12/22 0357  PROCALCITON  --  78.45  --   --  68.27  --   --   WBC 4.4  --   --   --  12.0*  --  15.3*  LATICACIDVEN  --  >9.0*   < > 6.2* 4.6* 4.1* 2.2*   < > = values in this interval not displayed.    Liver Function Tests: Recent Labs  Lab 05/10/22 0327 05/10/22 1437 05/10/22 2122  AST 49*  --  73*  ALT 19  --  27  ALKPHOS 29*  --  29*  BILITOT 0.8  --  0.8  PROT 6.3*  --  5.7*  ALBUMIN 3.0* 2.6* 2.7*   No results for input(s): "LIPASE", "AMYLASE" in the last 168 hours. No results for input(s): "AMMONIA" in  the last 168 hours.  ABG    Component Value Date/Time   PHART 7.44 05/11/2022 1139   PCO2ART 37 05/11/2022 1139   PO2ART 86 05/11/2022 1139   HCO3 25.1 05/11/2022 1139   ACIDBASEDEF 9.3 (H) 05/10/2022 1353   O2SAT 98.4 05/11/2022 1139     Coagulation Profile: Recent Labs  Lab 05/10/22 0327 05/10/22 2146  INR 2.8* 3.7*    Cardiac Enzymes: Recent Labs  Lab 05/11/22 0410  CKTOTAL 2,587*    HbA1C: Hgb A1c MFr Bld  Date/Time Value Ref Range Status  05/10/2022 05:32 AM 6.5 (H) 4.8 - 5.6 % Final    Comment:    (NOTE)         Prediabetes: 5.7 - 6.4         Diabetes: >6.4         Glycemic control for adults with diabetes: <7.0   02/08/2018 06:20 AM 6.1 (H) 4.8 - 5.6 % Final    Comment:    (NOTE) Pre diabetes:          5.7%-6.4% Diabetes:              >6.4% Glycemic control for   <7.0% adults with diabetes     CBG: Recent Labs  Lab 05/12/22 1528 05/12/22 1951 05/12/22 2356 05/13/22 0323 05/13/22 0755  GLUCAP 130* 133* 162* 125* 107*     DVT/GI PRX  assessed I Assessed the need for Labs I Assessed the need for Foley I Assessed the need for Central Venous Line Family Discussion when available I Assessed the need for Mobilization I made an Assessment of medications to be adjusted accordingly Safety Risk assessment completed  CASE DISCUSSED IN MULTIDISCIPLINARY ROUNDS WITH ICU TEAM   PROGNOSIS IS POOR, [place] Time devoted to patient care services described in this note is 30 minutes.  Critical care was necessary to treat /prevent imminent and life-threatening deterioration. Overall, patient is critically ill, prognosis is guarded.  Patient with Multiorgan failure and at high risk for cardiac arrest and death.   Arcelia Jew MD Pulmonary & Critical Care Medicine

## 2022-05-13 NOTE — Consult Note (Signed)
PHARMACY CONSULT NOTE - FOLLOW UP  Pharmacy Consult for Electrolyte Monitoring and Replacement   Recent Labs: Potassium (mmol/L)  Date Value  05/13/2022 3.7   Magnesium (mg/dL)  Date Value  05/13/2022 2.4   Calcium (mg/dL)  Date Value  05/13/2022 7.7 (L)   Albumin (g/dL)  Date Value  05/10/2022 2.7 (L)  02/19/2018 3.1 (L)   Phosphorus (mg/dL)  Date Value  05/13/2022 2.2 (L)   Sodium (mmol/L)  Date Value  05/13/2022 137  02/19/2018 137   Corrected Calcium: 8.74  Assessment: 72 yo F presenting to [facility] ED from home via EMS on 05/10/22 for evaluation of altered mental status. Family and patient report that she was in her normal state of health until late in the evening on 05/09/22 when she began having significant chills. Then she became less responsive prompting family to call EMS. Pharmacy has been consulted to monitor and replace electrolytes while under PCCM care.  Goal of Therapy:  Electrolytes WNL  Plan:  PhosNak 2 packets x 2 doses ordered Recheck electrolytes with AM labs   Pearla Dubonnet ,PharmD Clinical Pharmacist 05/13/2022 11:48 AM

## 2022-05-13 NOTE — Progress Notes (Signed)
Nutrition Follow-up  DOCUMENTATION CODES:   Morbid obesity  INTERVENTION:   Vital 1.5@70ml /hr- Initiate at 8ml/hr, once tolerating, increase by 7ml/hr q 8 hours until goal rate is reached.    ProSource TF 20- Give 33ml daily via tube, each supplement provides 80kcal and 20g of protein.    Free water flushes 57ml q4 hours to maintain tube patency    Regimen provides 2600kcal/day, 133g/day protein and 1460ml/day of free water.    Pt at high refeed risk; recommend monitor potassium, magnesium and phosphorus labs daily until stable   Juven Fruit Punch BID via tube, each serving provides 95kcal and 2.5g of protein (amino acids glutamine and arginine)   Vitamin C 500mg  BID via tube    Daily weights  NUTRITION DIAGNOSIS:   Inadequate oral intake related to acute illness as evidenced by NPO status.  Ongoing  GOAL:   Patient will meet greater than or equal to 90% of their needs  Unmet  MONITOR:   Diet advancement, Labs, Weight trends, I & O's, Skin  REASON FOR ASSESSMENT:   Rounds    ASSESSMENT:   72 y/o female with h/o HTN, L AKA, hypothyroidism, RA, thrombosis of aorta and renal artery, PAF and CHF who is admitted with sepsis, infected panus, AKI and CHF excerbation.  3/29- s/p cardioversion  Reviewed I/O's: +1.9 L x 24 hours and +8.9 L since admission  UOP: 1.5 L x 24 hours  NGT output: 250 ml x 24 hours   Case discussed with RN, MD, and during ICU rounds. Per RN, pt with minimal output of NGT today. NGT connected to low, intermittent suction currently. Per RN, TF were stopped yesterday secondary to vomiting and NGT placed back to suction. RN reports pt has not had any further vomiting and does not complain of vomiting. RN and MD plan for bedside swallow to see if pt can eat.   Per discussion with RN, pt failed BSE and SLP consult was ordered. Spoke with SLP and reviewed case with her; recommending possible clear liquid diet and possible GI consult due to  vomiting. Discussed recommendations with RN and MD, as well as recommending advancing tube to post-pyloric position due to continued vomiting. Per MD, plan to resume TF today gastrically and can re-evaluate need for post-pyloric tube.   Medications reviewed and include colace, solu-cortef, reglan, zofran, thiamine, 0.9% sodium chloride infusion @ 20 ml/hr, diflucan, levophed, phenergan, and pitressin.   Labs reviewed: CBGS: 89-107 (inpatient orders for glycemic control are 0-15 units insulin aspart every 4 hours).    Diet Order:   Diet Order             Diet NPO time specified Except for: Sips with Meds  Diet effective now                   EDUCATION NEEDS:   No education needs have been identified at this time  Skin:  Skin Assessment: Reviewed RN Assessment (Stage II sacrum, MASD, cellulitis panus)  Last BM:  3/28- type 6  Height:   Ht Readings from Last 1 Encounters:  05/10/22 5\' 7"  (1.702 m)    Weight:   Wt Readings from Last 1 Encounters:  05/11/22 (!) 172.5 kg    Ideal Body Weight:  61.36 kg  BMI:  Body mass index is 59.56 kg/m.  Estimated Nutritional Needs:   Kcal:  2700-3000kcal/day  Protein:  >135g/day  Fluid:  1.5-1.7L/day    Loistine Chance, RD, LDN, [place] Registered Dietitian II Certified  Diabetes Care and Education Specialist Please refer to [facility] for RD and/or RD on-call/weekend/after hours pager

## 2022-05-13 NOTE — Progress Notes (Signed)
ANTICOAGULATION CONSULT NOTE - Initial Consult  Pharmacy Consult for Heparin Indication:  arterial thrombosis  No Known Allergies  Patient Measurements: Height: 5\' 7"  (170.2 cm) Weight: (!) 172.5 kg (380 lb 4.7 oz) IBW/kg (Calculated) : 61.6 Heparin Dosing Weight: 104 kg   Vital Signs: Temp: 98.5 F (36.9 C) (03/29 0845) Temp Source: Oral (03/29 0845) BP: 91/44 (03/29 1000) Pulse Rate: 89 (03/29 1000)  Labs: Recent Labs    05/10/22 1437 05/10/22 1930 05/10/22 2122 05/10/22 2146 05/11/22 0214 05/11/22 0410 05/11/22 1042 05/12/22 0357 05/12/22 0438 05/12/22 1142 05/12/22 1921 05/12/22 2259 05/13/22 0744 05/13/22 0849  HGB   < >  --  13.9  --   --   --   --  13.3  --   --   --   --  12.6  --   HCT  --   --  42.2  --   --   --   --  39.9  --   --   --   --  38.4  --   PLT  --   --  159  --   --   --   --  171  --   --   --   --  PLATELET CLUMPS NOTED ON SMEAR, UNABLE TO ESTIMATE  --   LABPROT  --   --   --  36.4*  --   --   --   --   --   --   --   --   --  27.7*  INR  --   --   --  3.7*  --   --   --   --   --   --   --   --   --  2.6*  HEPARINUNFRC  --  0.39  --  0.30   < >  --   --   --    < > 0.30 0.30  --  0.31  --   CREATININE  --   --  1.32* 1.38*  --   --    < > 0.95  --   --   --  0.87 0.84  --   CKTOTAL  --   --   --   --   --  2,587*  --   --   --   --   --   --   --   --   TROPONINIHS  --  75*  --   --   --   --   --   --   --   --   --   --   --   --    < > = values in this interval not displayed.     Estimated Creatinine Clearance: 105.8 mL/min (by C-G formula based on SCr of 0.84 mg/dL).   Medical History: Past Medical History:  Diagnosis Date   Arterial thrombosis ([place])    a. s/p thromboectomy and thrombolysis with left BKA in 2008 on chronic Coumadin    HLD (hyperlipidemia)    Hypertension    Hypothyroidism    Obesity    PAF (paroxysmal atrial fibrillation) ([place])    a. noted 12/19 in the setting of hypokalemia; b. CHADS2VASc 4 (HTN, age x 1,  vascular disease, female); c. on chronic Coumadin in the setting of prior arterial clot   Rheumatoid arthritis ([place])     Medications:  Medications Prior to Admission  Medication Sig Dispense Refill Last  Dose   albuterol (PROVENTIL HFA;VENTOLIN HFA) 108 (90 Base) MCG/ACT inhaler Inhale 2 puffs into the lungs every 6 (six) hours as needed for wheezing or shortness of breath.   prn at unk   fluticasone (FLONASE) 50 MCG/ACT nasal spray Place 1 spray into both nostrils daily as needed for allergies or rhinitis.   prn at unk   furosemide (LASIX) 40 MG tablet Take 40 mg by mouth daily.      gabapentin (NEURONTIN) 600 MG tablet Take 600 mg by mouth 3 (three) times daily.      hydroxychloroquine (PLAQUENIL) 200 MG tablet TAKE 2 TABLETS(400 MG) BY MOUTH EVERY DAY      levothyroxine (SYNTHROID) 150 MCG tablet Take 150 mcg by mouth daily.      lovastatin (MEVACOR) 20 MG tablet Take 20 mg by mouth every evening.      methotrexate (RHEUMATREX) 2.5 MG tablet Take 2.5 mg by mouth once a week.       metoprolol tartrate (LOPRESSOR) 25 MG tablet Take 25 mg by mouth 2 (two) times daily.       potassium chloride (KLOR-CON M) 10 MEQ tablet Take 10 mEq by mouth daily.      predniSONE (DELTASONE) 1 MG tablet Take by mouth.      TRADJENTA 5 MG TABS tablet Take 5 mg by mouth daily.      warfarin (COUMADIN) 10 MG tablet Take 10 mg by mouth daily.      furosemide (LASIX) 20 MG tablet Take 1 tablet (20 mg total) by mouth daily. 30 tablet 5    JARDIANCE 10 MG TABS tablet Take 10 mg by mouth daily.   unk at unk   levothyroxine (SYNTHROID, LEVOTHROID) 137 MCG tablet Take 137 mcg by mouth daily before breakfast. (Patient not taking: Reported on 05/10/2022)   Not Taking   predniSONE (DELTASONE) 5 MG tablet Take 5 mg by mouth daily with breakfast.  (Patient not taking: Reported on 05/10/2022)   Not Taking    Assessment: Pharmacy consulted to dose heparin in this 72 year old female admitted for sepsis and hx of PAF and  arterial thrombosis.   Pt was on warfarin 10 mg PO daily PTA  3/26:  INR @ 0327 = 2.8  Goal of Therapy:  Heparin level 0.3-0.7 units/ml Monitor platelets by anticoagulation protocol: Yes  Date Time: HL: Rate: 3/26 0932 0.13  Subtherapeutic/ 1700 > 2000 u/hr 3/26 1930 0.39 Therapeutic x1/ 2000 u/hr 3/26     2146   0.30     Therapeutic X 2 / 2000 un/hr 3/27     0214   0.36     Therapeutic X 3  3/28     0438   0.29     Subtherapeutic 3/28 1142 0.30 Therapeutic X 1 3/28 1921 0.30 Therapeutic X 2 3/29 0744 0.31 Therapeutic X 3  Plan:  Heparin remains therapeutic - Will continue heparin drip rate at 2200 units/hr. - Will recheck HL with AM labs  Pearla Dubonnet, PharmD Clinical Pharmacist 05/13/2022 11:47 AM

## 2022-05-13 NOTE — CV Procedure (Signed)
Cardioversion procedure note For atrial fibrillation with RVR  Procedure Details:  Consent: Risks of procedure as well as the alternatives and risks of each were explained to the (patient/caregiver).  Consent for procedure obtained.  Time Out: Verified patient identification, verified procedure, site/side was marked, verified correct patient position, special equipment/implants available, medications/allergies/relevent history reviewed, required imaging and test results available.  Performed  Patient placed on cardiac monitor, pulse oximetry, supplemental oxygen as necessary.   Sedation given: propofol IV, Dr. Rosey Bath Pacer pads placed anterior and posterior chest.   Cardioverted 1 time(s).   Cardioverted at  200 J. Synchronized biphasic Converted to NSR   Evaluation: Findings: Post procedure EKG shows: NSR Complications: None Patient did tolerate procedure well.  Time Spent Directly with the Patient:  2 minutes   Esmond Plants, M.D., Ph.D.

## 2022-05-13 NOTE — Transfer of Care (Signed)
Immediate Anesthesia Transfer of Care Note  Patient: Kayla Reyes  Procedure(s) Performed: CARDIOVERSION  Patient Location: ICU  Anesthesia Type:General  Level of Consciousness: drowsy  Airway & Oxygen Therapy: Patient Spontanous Breathing and Patient connected to face mask oxygen  Post-op Assessment: Report given to RN and Post -op Vital signs reviewed and stable  Post vital signs: Reviewed and stable  Last Vitals:  Vitals Value Taken Time  BP 102/73 05/13/22 0900  Temp    Pulse 85 05/13/22 0904  Resp 28 05/13/22 0904  SpO2 98 % 05/13/22 0904    Last Pain:  Vitals:   05/13/22 0845  TempSrc: Oral  PainSc:          Complications: No notable events documented.

## 2022-05-14 DIAGNOSIS — I4891 Unspecified atrial fibrillation: Secondary | ICD-10-CM | POA: Diagnosis not present

## 2022-05-14 DIAGNOSIS — R6521 Severe sepsis with septic shock: Secondary | ICD-10-CM | POA: Diagnosis not present

## 2022-05-14 DIAGNOSIS — A419 Sepsis, unspecified organism: Secondary | ICD-10-CM | POA: Diagnosis not present

## 2022-05-14 LAB — BASIC METABOLIC PANEL
Anion gap: 15 (ref 5–15)
BUN: 21 mg/dL (ref 8–23)
CO2: 23 mmol/L (ref 22–32)
Calcium: 7.7 mg/dL — ABNORMAL LOW (ref 8.9–10.3)
Chloride: 100 mmol/L (ref 98–111)
Creatinine, Ser: 0.79 mg/dL (ref 0.44–1.00)
GFR, Estimated: >60 mL/min (ref >60)
Glucose, Bld: 115 mg/dL — ABNORMAL HIGH (ref 70–99)
Potassium: 3.1 mmol/L — ABNORMAL LOW (ref 3.5–5.1)
Sodium: 138 mmol/L (ref 135–145)

## 2022-05-14 LAB — CBC
HCT: 38.1 % (ref 36.0–46.0)
Hemoglobin: 12.9 g/dL (ref 12.0–15.0)
MCH: 30.3 pg (ref 26.0–34.0)
MCHC: 33.9 g/dL (ref 30.0–36.0)
MCV: 89.4 fL (ref 80.0–100.0)
Platelets: UNDETERMINED 10*3/uL (ref 150–400)
RBC: 4.26 MIL/uL (ref 3.87–5.11)
RDW: 16.7 % — ABNORMAL HIGH (ref 11.5–15.5)
WBC: 9 10*3/uL (ref 4.0–10.5)
nRBC: 0.3 % — ABNORMAL HIGH (ref 0.0–0.2)

## 2022-05-14 LAB — GLUCOSE, CAPILLARY
Glucose-Capillary: 112 mg/dL — ABNORMAL HIGH (ref 70–99)
Glucose-Capillary: 117 mg/dL — ABNORMAL HIGH (ref 70–99)
Glucose-Capillary: 124 mg/dL — ABNORMAL HIGH (ref 70–99)
Glucose-Capillary: 133 mg/dL — ABNORMAL HIGH (ref 70–99)
Glucose-Capillary: 140 mg/dL — ABNORMAL HIGH (ref 70–99)
Glucose-Capillary: 143 mg/dL — ABNORMAL HIGH (ref 70–99)
Glucose-Capillary: 145 mg/dL — ABNORMAL HIGH (ref 70–99)

## 2022-05-14 LAB — POTASSIUM: Potassium: 3.4 mmol/L — ABNORMAL LOW (ref 3.5–5.1)

## 2022-05-14 LAB — HEPARIN LEVEL (UNFRACTIONATED)
Heparin Unfractionated: 0.28 IU/mL — ABNORMAL LOW (ref 0.30–0.70)
Heparin Unfractionated: 0.33 IU/mL (ref 0.30–0.70)
Heparin Unfractionated: <0.1 IU/mL — ABNORMAL LOW (ref 0.30–0.70)
Heparin Unfractionated: <0.1 IU/mL — ABNORMAL LOW (ref 0.30–0.70)

## 2022-05-14 LAB — MAGNESIUM: Magnesium: 2.4 mg/dL (ref 1.7–2.4)

## 2022-05-14 LAB — PHOSPHORUS: Phosphorus: 2.8 mg/dL (ref 2.5–4.6)

## 2022-05-14 MED ORDER — AMIODARONE LOAD VIA INFUSION
150.0000 mg | Freq: Once | INTRAVENOUS | Status: AC
  Administered 2022-05-14: 150 mg via INTRAVENOUS
  Filled 2022-05-14: qty 83.34

## 2022-05-14 MED ORDER — HEPARIN BOLUS VIA INFUSION
1500.0000 [IU] | Freq: Once | INTRAVENOUS | Status: AC
  Administered 2022-05-14: 1500 [IU] via INTRAVENOUS
  Filled 2022-05-14: qty 1500

## 2022-05-14 MED ORDER — CHLORHEXIDINE GLUCONATE CLOTH 2 % EX PADS
6.0000 | MEDICATED_PAD | Freq: Every day | CUTANEOUS | Status: DC
  Administered 2022-05-15 – 2022-05-18 (×4): 6 via TOPICAL

## 2022-05-14 MED ORDER — POTASSIUM CHLORIDE 20 MEQ PO PACK
40.0000 meq | PACK | Freq: Once | ORAL | Status: AC
  Administered 2022-05-14: 40 meq via ORAL
  Filled 2022-05-14: qty 2

## 2022-05-14 MED ORDER — CHLORHEXIDINE GLUCONATE CLOTH 2 % EX PADS
6.0000 | MEDICATED_PAD | Freq: Every day | CUTANEOUS | Status: AC
  Administered 2022-05-14: 6 via TOPICAL

## 2022-05-14 MED ORDER — HEPARIN BOLUS VIA INFUSION
2400.0000 [IU] | Freq: Once | INTRAVENOUS | Status: DC
  Filled 2022-05-14: qty 2400

## 2022-05-14 MED ORDER — HEPARIN BOLUS VIA INFUSION
3100.0000 [IU] | Freq: Once | INTRAVENOUS | Status: AC
  Administered 2022-05-14: 3100 [IU] via INTRAVENOUS
  Filled 2022-05-14: qty 3100

## 2022-05-14 NOTE — Progress Notes (Signed)
ANTICOAGULATION CONSULT NOTE - Initial Consult  Pharmacy Consult for Heparin Indication:  arterial thrombosis  No Known Allergies  Patient Measurements: Height: 5\' 7"  (170.2 cm) Weight: (!) 172.5 kg (380 lb 4.7 oz) IBW/kg (Calculated) : 61.6 Heparin Dosing Weight: 104 kg   Vital Signs: Temp: 98.2 F (36.8 C) (03/30 0100) Temp Source: Oral (03/30 0100) BP: 90/53 (03/30 0600) Pulse Rate: 90 (03/30 0600)  Labs: Recent Labs    05/12/22 0357 05/12/22 0438 05/12/22 1921 05/12/22 2259 05/13/22 0744 05/13/22 0849 05/14/22 0509  HGB 13.3  --   --   --  12.6  --  12.9  HCT 39.9  --   --   --  38.4  --  38.1  PLT 171  --   --   --  PLATELET CLUMPS NOTED ON SMEAR, UNABLE TO ESTIMATE  --  PLATELET CLUMPS NOTED ON SMEAR, UNABLE TO ESTIMATE  LABPROT  --   --   --   --   --  27.7*  --   INR  --   --   --   --   --  2.6*  --   HEPARINUNFRC  --    < > 0.30  --  0.31  --  0.28*  CREATININE 0.95  --   --  0.87 0.84  --  0.79   < > = values in this interval not displayed.     Estimated Creatinine Clearance: 111.1 mL/min (by C-G formula based on SCr of 0.79 mg/dL).   Medical History: Past Medical History:  Diagnosis Date   Arterial thrombosis ([place])    a. 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 noted 12/19 in the setting of hypokalemia; b. CHADS2VASc 4 (HTN, age x 1, vascular disease, female); c. on chronic Coumadin in the setting of prior arterial clot   Rheumatoid arthritis ([place])     Medications:  Medications Prior to Admission  Medication Sig Dispense Refill Last Dose   albuterol (PROVENTIL HFA;VENTOLIN HFA) 108 (90 Base) MCG/ACT inhaler Inhale 2 puffs into the lungs every 6 (six) hours as needed for wheezing or shortness of breath.   prn at unk   fluticasone (FLONASE) 50 MCG/ACT nasal spray Place 1 spray into both  nostrils daily as needed for allergies or rhinitis.   prn at unk   furosemide (LASIX) 40 MG tablet Take 40 mg by mouth daily.      gabapentin (NEURONTIN) 600 MG tablet Take 600 mg by mouth 3 (three) times daily.      hydroxychloroquine (PLAQUENIL) 200 MG tablet TAKE 2 TABLETS(400 MG) BY MOUTH EVERY DAY      levothyroxine (SYNTHROID) 150 MCG tablet Take 150 mcg by mouth daily.      lovastatin (MEVACOR) 20 MG tablet Take 20 mg by mouth every evening.      methotrexate (RHEUMATREX) 2.5 MG tablet Take 2.5 mg by mouth once a week.       metoprolol tartrate (LOPRESSOR) 25 MG tablet Take 25 mg by mouth 2 (two) times daily.       potassium chloride (KLOR-CON M) 10 MEQ tablet Take 10 mEq by mouth daily.      predniSONE (DELTASONE) 1 MG tablet Take by mouth.      TRADJENTA 5 MG TABS tablet Take 5 mg by mouth daily.  warfarin (COUMADIN) 10 MG tablet Take 10 mg by mouth daily.      furosemide (LASIX) 20 MG tablet Take 1 tablet (20 mg total) by mouth daily. 30 tablet 5    JARDIANCE 10 MG TABS tablet Take 10 mg by mouth daily.   unk at unk   levothyroxine (SYNTHROID, LEVOTHROID) 137 MCG tablet Take 137 mcg by mouth daily before breakfast. (Patient not taking: Reported on 05/10/2022)   Not Taking   predniSONE (DELTASONE) 5 MG tablet Take 5 mg by mouth daily with breakfast.  (Patient not taking: Reported on 05/10/2022)   Not Taking    Assessment: Pharmacy consulted to dose heparin in this 72 year old female admitted for sepsis and hx of PAF and arterial thrombosis.   Pt was on warfarin 10 mg PO daily PTA  3/26:  INR @ 0327 = 2.8  Goal of Therapy:  Heparin level 0.3-0.7 units/ml Monitor platelets by anticoagulation protocol: Yes  Date Time: HL: Rate: 3/26 0932 0.13  Subtherapeutic/ 1700 > 2000 u/hr 3/26 1930 0.39 Therapeutic x1/ 2000 u/hr 3/26     2146   0.30     Therapeutic X 2 / 2000 un/hr 3/27     0214   0.36     Therapeutic X 3  3/28     0438   0.29     Subtherapeutic 3/28 1142 0.30 Therapeutic  X 1 3/28 1921 0.30 Therapeutic X 2 3/29 0744 0.31 Therapeutic X 3 3/30     0504   0.28     SUBtherapeutic   Plan:  3/30:  HL @ 0504 = 0.28, SUBtherapeutic  Will order heparin 1500 units IV X 1 bolus and increase drip rate to 2400 units/hr.  Will recheck HL 6 hrs after rate change.   Orene Desanctis, PharmD Clinical Pharmacist 05/14/2022 6:12 AM

## 2022-05-14 NOTE — Consult Note (Addendum)
PHARMACY CONSULT NOTE - FOLLOW UP  Pharmacy Consult for Electrolyte Monitoring and Replacement   Recent Labs: Potassium (mmol/L)  Date Value  05/14/2022 3.1 (L)   Magnesium (mg/dL)  Date Value  05/14/2022 2.4   Calcium (mg/dL)  Date Value  05/14/2022 7.7 (L)   Albumin (g/dL)  Date Value  05/10/2022 2.7 (L)  02/19/2018 3.1 (L)   Phosphorus (mg/dL)  Date Value  05/14/2022 2.8   Sodium (mmol/L)  Date Value  05/14/2022 138  02/19/2018 137   Corrected Calcium: Ca 7.7  alb 2.7 (3/26) Corr Ca=8.7  Assessment: 72 yo F presenting to [facility] ED from home via EMS on 05/10/22 for evaluation of altered mental status. Family and patient report that she was in her normal state of health until late in the evening on 05/09/22 when she began having significant chills. Then she became less responsive prompting family to call EMS. Pharmacy has been consulted to monitor and replace electrolytes while under PCCM care. -s/p cardioversion 3/29  Goal of Therapy:  Electrolytes WNL  Plan:  K 3.1    KCL 40 meq packet po x 1 ordered by NP Recheck K at 1800 Recheck electrolytes with AM labs   Noralee Space ,PharmD Clinical Pharmacist 05/14/2022 9:27 AM

## 2022-05-14 NOTE — Anesthesia Postprocedure Evaluation (Signed)
Anesthesia Post Note  Patient: Kayla Reyes  Procedure(s) Performed: CARDIOVERSION  Patient location during evaluation: SICU Anesthesia Type: MAC Level of consciousness: awake and alert Pain management: pain level controlled Vital Signs Assessment: post-procedure vital signs reviewed and stable Respiratory status: spontaneous breathing, respiratory function stable and patient connected to nasal cannula oxygen Cardiovascular status: stable Postop Assessment: no apparent nausea or vomiting Anesthetic complications: no   No notable events documented.   Last Vitals:  Vitals:   05/14/22 1000 05/14/22 1100  BP: 109/74 (!) 102/54  Pulse: 88   Resp: (!) 22 (!) 21  Temp:  36.9 C  SpO2: 92% 93%    Last Pain:  Vitals:   05/14/22 1100  TempSrc: Oral  PainSc:                  Martha Clan

## 2022-05-14 NOTE — Progress Notes (Signed)
PROGRESS NOTE    Kayla Reyes  [id number]  DOB: 11/18/1950  DOA: 05/10/2022 PCP: Marguerita Merles, MD Outpatient Specialists:   Hospital course:  72 year old female was admitted with sepsis.  Workup was unrevealing except for infection under patient's large pannus.  She has been treated with broad-spectrum antibiotics is now presently on linezolid and fluconazole.  She has been weaned off of pressors.   Patient is also had atrial fibrillation with RVR.  She underwent DCCV 3/29 but now has reverted back to A-fib today.  Subjective:  Patient is without complaints.  She is awake and alert but not very communicative.  Objective: Vitals:   05/14/22 1330 05/14/22 1345 05/14/22 1400 05/14/22 1408  BP: 111/66 (!) 87/67 99/71   Pulse:      Resp: 20 (!) 26 (!) 28   Temp:      TempSrc:      SpO2:   93% 93%  Weight:      Height:        Intake/Output Summary (Last 24 hours) at 05/14/2022 1422 Last data filed at 05/14/2022 1400 Gross per 24 hour  Intake 2165.23 ml  Output 1080 ml  Net 1085.23 ml   Filed Weights   05/10/22 0322 05/11/22 0709 05/14/22 0500  Weight: (!) 167.1 kg (!) 172.5 kg (!) 172.1 kg     Exam:  General: Morbidly obese female sitting up in ICU bed in no respiratory distress with attentive son at bedside. Eyes: sclera anicteric, conjuctiva mild injection bilaterally CVS: Distant heart sounds Respiratory: Difficult to auscultate due to body habitus GI: Extremely large pannus, difficult to assess, abdomen is soft. LE: S/p L BKA  Data Reviewed:  Basic Metabolic Panel: Recent Labs  Lab 05/10/22 2122 05/10/22 2146 05/11/22 1936 05/12/22 0357 05/12/22 2259 05/13/22 0744 05/14/22 0509  NA 137   < > 135 138 135 137 138  K 3.6   < > 3.9 3.5 3.4* 3.7 3.1*  CL 105   < > 103 105 104 104 100  CO2 21*   < > 24 26 23 25 23   GLUCOSE 167*   < > 152* 125* 171* 115* 115*  BUN 24*   < > 21 23 20 21 21   CREATININE 1.32*   < > 1.11* 0.95 0.87 0.84 0.79   CALCIUM 7.9*   < > 7.3* 7.3* 7.1* 7.7* 7.7*  MG 2.1  --  2.1 2.1 1.9 2.4 2.4  PHOS 4.0  --  3.4 3.0  --  2.2* 2.8   < > = values in this interval not displayed.    CBC: Recent Labs  Lab 05/10/22 0327 05/10/22 2122 05/12/22 0357 05/13/22 0744 05/14/22 0509  WBC 4.4 12.0* 15.3* 10.8* 9.0  NEUTROABS 3.5  --   --   --   --   HGB 15.2* 13.9 13.3 12.6 12.9  HCT 48.8* 42.2 39.9 38.4 38.1  MCV 97.0 92.5 92.6 92.8 89.4  PLT 217 159 171 PLATELET CLUMPS NOTED ON SMEAR, UNABLE TO ESTIMATE PLATELET CLUMPS NOTED ON SMEAR, UNABLE TO ESTIMATE     Scheduled Meds:  vitamin C  500 mg Per Tube BID   [START ON 05/15/2022] Chlorhexidine Gluconate Cloth  6 each Topical Daily   docusate  100 mg Per Tube BID   feeding supplement (VITAL 1.5 CAL)  1,000 mL Per Tube Q24H   free water  30 mL Per Tube Q4H   hydrocortisone sod succinate (SOLU-CORTEF) inj  100 mg Intravenous Daily   hydroxychloroquine  400 mg Oral Daily   insulin aspart  0-15 Units Subcutaneous Q4H   levalbuterol  0.63 mg Nebulization TID   And   ipratropium  0.5 mg Nebulization TID   leptospermum manuka honey  1 Application Topical Daily   levothyroxine  137 mcg Per Tube QAC breakfast   liver oil-zinc oxide   Topical TID   metoCLOPramide (REGLAN) injection  10 mg Intravenous Q8H   metoprolol tartrate  25 mg Oral BID   mupirocin ointment  1 Application Nasal BID   ondansetron (ZOFRAN) IV  4 mg Intravenous Q6H   pantoprazole (PROTONIX) IV  40 mg Intravenous Daily   polyethylene glycol  17 g Per Tube Daily   sodium chloride flush  10-40 mL Intracatheter Q12H   thiamine (VITAMIN B1) injection  100 mg Intravenous Daily   Continuous Infusions:  sodium chloride Stopped (05/10/22 1357)   sodium chloride Stopped (05/14/22 1046)   amiodarone 60 mg/hr (05/14/22 1400)   fluconazole (DIFLUCAN) IV Stopped (05/13/22 1722)   heparin 2,400 Units/hr (05/14/22 1400)   linezolid (ZYVOX) IV Stopped (05/14/22 1042)   norepinephrine (LEVOPHED)  Adult infusion Stopped (05/13/22 0942)   promethazine (PHENERGAN) injection (IM or IVPB) Stopped (05/13/22 0533)     Assessment & Plan:   Sepsis Thought to be soft tissue infection of her large pannus Patient was treated with broad-spectrum antibiotics, initially with cefepime and vancomycin  She subsequently was changed to then meropenem from 3/26 through 3/28. She was started on linezolid 3/28 She had been requiring pressor support, now doing well off pressors and even with the addition of metoprolol 25 twice daily her pressures are reasonable.  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 She is on chronic anticoagulation with warfarin at home Presently on heparin drip    DVT prophylaxis: Heparin drip Code Status: Full Family Communication: Son was at bedside throughout    Studies: No results found.  Principal Problem:   Sepsis ([place]) Active Problems:   Fever   AKI (acute kidney injury) ([place])   Hypotension   Pressure injury of skin of sacral region   Atrial fibrillation with RVR ([place])     Jarom Govan Derek Jack, [facility]  If 7PM-7AM, please contact night-coverage [email or web address]   LOS: 4 days

## 2022-05-14 NOTE — Progress Notes (Signed)
Patient went back into Atrial Fibrillation during bath. EKG obtained and rhythm confirmed. Heart rate ranging 110-140's.   Blood pressure 100/71 (79) Cardiology MD Agbor-Etang paged. New orders received for 150 mg Amio Bolus and to increase Amidoarone drip to 60 Mg/hr.   Cardiology MD advised for patient to remain in SD/ICU unit.

## 2022-05-14 NOTE — Consult Note (Signed)
PHARMACY CONSULT NOTE - FOLLOW UP  Pharmacy Consult for Electrolyte Monitoring and Replacement   Recent Labs: Potassium (mmol/L)  Date Value  05/14/2022 3.4 (L)   Magnesium (mg/dL)  Date Value  05/14/2022 2.4   Calcium (mg/dL)  Date Value  05/14/2022 7.7 (L)   Albumin (g/dL)  Date Value  05/10/2022 2.7 (L)  02/19/2018 3.1 (L)   Phosphorus (mg/dL)  Date Value  05/14/2022 2.8   Sodium (mmol/L)  Date Value  05/14/2022 138  02/19/2018 137   Corrected Calcium: Ca 7.7  alb 2.7 (3/26) Corr Ca=8.7  Assessment: 72 yo F presenting to [facility] ED from home via EMS on 05/10/22 for evaluation of altered mental status. Family and patient report that she was in her normal state of health until late in the evening on 05/09/22 when she began having significant chills. Then she became less responsive prompting family to call EMS. Pharmacy has been consulted to monitor and replace electrolytes while under PCCM care. -s/p cardioversion 3/29  Goal of Therapy:  Electrolytes WNL  Plan:  -K 3.4 -Give Kcl 40 mEq packet x 1 -Recheck electrolytes with AM labs   Lorin Picket ,PharmD Clinical Pharmacist 05/14/2022 6:58 PM

## 2022-05-14 NOTE — Progress Notes (Signed)
ANTICOAGULATION CONSULT NOTE -   Pharmacy Consult for Heparin Indication:  arterial thrombosis  No Known Allergies  Patient Measurements: Height: 5\' 7"  (170.2 cm) Weight: (!) 172.1 kg (379 lb 6.6 oz) IBW/kg (Calculated) : 61.6 Heparin Dosing Weight: 104 kg   Vital Signs: Temp: 98.9 F (37.2 C) (03/30 1930) Temp Source: Oral (03/30 1930) BP: 85/53 (03/30 2200) Pulse Rate: 84 (03/30 2200)  Labs: Recent Labs    05/12/22 0357 05/12/22 0438 05/12/22 2259 05/13/22 0744 05/13/22 0849 05/14/22 0509 05/14/22 1236 05/14/22 1833 05/14/22 2112  HGB 13.3  --   --  12.6  --  12.9  --   --   --   HCT 39.9  --   --  38.4  --  38.1  --   --   --   PLT 171  --   --  PLATELET CLUMPS NOTED ON SMEAR, UNABLE TO ESTIMATE  --  PLATELET CLUMPS NOTED ON SMEAR, UNABLE TO ESTIMATE  --   --   --   LABPROT  --   --   --   --  27.7*  --   --   --   --   INR  --   --   --   --  2.6*  --   --   --   --   HEPARINUNFRC  --    < >  --  0.31  --  0.28* 0.33 <0.10* <0.10*  CREATININE 0.95  --  0.87 0.84  --  0.79  --   --   --    < > = values in this interval not displayed.     Estimated Creatinine Clearance: 110.9 mL/min (by C-G formula based on SCr of 0.79 mg/dL).   Medical History: Past Medical History:  Diagnosis Date   Arterial thrombosis ([place])    a. 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 noted 12/19 in the setting of hypokalemia; b. CHADS2VASc 4 (HTN, age x 1, vascular disease, female); c. on chronic Coumadin in the setting of prior arterial clot   Rheumatoid arthritis ([place])     Medications:  Medications Prior to Admission  Medication Sig Dispense Refill Last Dose   albuterol (PROVENTIL HFA;VENTOLIN HFA) 108 (90 Base) MCG/ACT inhaler Inhale 2 puffs into the lungs every 6 (six) hours as needed for wheezing or shortness of breath.    prn at unk   fluticasone (FLONASE) 50 MCG/ACT nasal spray Place 1 spray into both nostrils daily as needed for allergies or rhinitis.   prn at unk   furosemide (LASIX) 40 MG tablet Take 40 mg by mouth daily.      gabapentin (NEURONTIN) 600 MG tablet Take 600 mg by mouth 3 (three) times daily.      hydroxychloroquine (PLAQUENIL) 200 MG tablet TAKE 2 TABLETS(400 MG) BY MOUTH EVERY DAY      levothyroxine (SYNTHROID) 150 MCG tablet Take 150 mcg by mouth daily.      lovastatin (MEVACOR) 20 MG tablet Take 20 mg by mouth every evening.      methotrexate (RHEUMATREX) 2.5 MG tablet Take 2.5 mg by mouth once a week.       metoprolol tartrate (LOPRESSOR) 25 MG tablet Take 25 mg by mouth 2 (two) times daily.       potassium  chloride (KLOR-CON M) 10 MEQ tablet Take 10 mEq by mouth daily.      predniSONE (DELTASONE) 1 MG tablet Take by mouth.      TRADJENTA 5 MG TABS tablet Take 5 mg by mouth daily.      warfarin (COUMADIN) 10 MG tablet Take 10 mg by mouth daily.      furosemide (LASIX) 20 MG tablet Take 1 tablet (20 mg total) by mouth daily. 30 tablet 5    JARDIANCE 10 MG TABS tablet Take 10 mg by mouth daily.   unk at unk   levothyroxine (SYNTHROID, LEVOTHROID) 137 MCG tablet Take 137 mcg by mouth daily before breakfast. (Patient not taking: Reported on 05/10/2022)   Not Taking   predniSONE (DELTASONE) 5 MG tablet Take 5 mg by mouth daily with breakfast.  (Patient not taking: Reported on 05/10/2022)   Not Taking    Assessment: Pharmacy consulted to dose heparin in this 72 year old female admitted for sepsis and hx of PAF and arterial thrombosis.   Pt was on warfarin 10 mg PO daily PTA  3/26:  INR @ 0327 = 2.8  Goal of Therapy:  Heparin level 0.3-0.7 units/ml Monitor platelets by anticoagulation protocol: Yes  Date Time: HL: Rate: 3/26 0932 0.13  Subtherapeutic/ 1700 > 2000 u/hr 3/26 1930 0.39 Therapeutic x1/ 2000 u/hr 3/26     2146   0.30     Therapeutic X 2 / 2000 un/hr 3/27     0214   0.36      Therapeutic X 3  3/28     0438   0.29     Subtherapeutic 3/28 1142 0.30 Therapeutic X 1 3/28 1921 0.30 Therapeutic X 2 3/29 0744 0.31 Therapeutic X 3 3/30     0504   0.28     SUBtherapeutic  3/30 1236 0.33 Therapeutic x 1 at 2400 u/hr 3/30     1833  < 0.1      SUBtherapeutic @ 2400 units/hr - STAT redraw ordered -  3/30     2112   < 0.1     SUBtherapeutic @ 2400 units/hr  Plan:  3/30:  HL @ 212 = < 0.1 ,  SUBtherapeutic X 2  - Will order heparin 3100 units IV X 1 bolus and increase drip rate to 2800 units/hr.  - Will recheck HL 6 hrs after rate change CBC daily  Crayton Savarese D, PharmD Clinical Pharmacist 05/14/2022 10:54 PM

## 2022-05-14 NOTE — Progress Notes (Signed)
ANTICOAGULATION CONSULT NOTE -   Pharmacy Consult for Heparin Indication:  arterial thrombosis  No Known Allergies  Patient Measurements: Height: 5\' 7"  (170.2 cm) Weight: (!) 172.1 kg (379 lb 6.6 oz) IBW/kg (Calculated) : 61.6 Heparin Dosing Weight: 104 kg   Vital Signs: Temp: 98.4 F (36.9 C) (03/30 1100) Temp Source: Oral (03/30 1100) BP: 82/69 (03/30 1309) Pulse Rate: 53 (03/30 1304)  Labs: Recent Labs    05/12/22 0357 05/12/22 0438 05/12/22 2259 05/13/22 0744 05/13/22 0849 05/14/22 0509 05/14/22 1236  HGB 13.3  --   --  12.6  --  12.9  --   HCT 39.9  --   --  38.4  --  38.1  --   PLT 171  --   --  PLATELET CLUMPS NOTED ON SMEAR, UNABLE TO ESTIMATE  --  PLATELET CLUMPS NOTED ON SMEAR, UNABLE TO ESTIMATE  --   LABPROT  --   --   --   --  27.7*  --   --   INR  --   --   --   --  2.6*  --   --   HEPARINUNFRC  --    < >  --  0.31  --  0.28* 0.33  CREATININE 0.95  --  0.87 0.84  --  0.79  --    < > = values in this interval not displayed.     Estimated Creatinine Clearance: 110.9 mL/min (by C-G formula based on SCr of 0.79 mg/dL).   Medical History: Past Medical History:  Diagnosis Date   Arterial thrombosis ([place])    a. 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 noted 12/19 in the setting of hypokalemia; b. CHADS2VASc 4 (HTN, age x 1, vascular disease, female); c. on chronic Coumadin in the setting of prior arterial clot   Rheumatoid arthritis ([place])     Medications:  Medications Prior to Admission  Medication Sig Dispense Refill Last Dose   albuterol (PROVENTIL HFA;VENTOLIN HFA) 108 (90 Base) MCG/ACT inhaler Inhale 2 puffs into the lungs every 6 (six) hours as needed for wheezing or shortness of breath.   prn at unk   fluticasone (FLONASE) 50 MCG/ACT nasal spray Place 1 spray into both nostrils daily as  needed for allergies or rhinitis.   prn at unk   furosemide (LASIX) 40 MG tablet Take 40 mg by mouth daily.      gabapentin (NEURONTIN) 600 MG tablet Take 600 mg by mouth 3 (three) times daily.      hydroxychloroquine (PLAQUENIL) 200 MG tablet TAKE 2 TABLETS(400 MG) BY MOUTH EVERY DAY      levothyroxine (SYNTHROID) 150 MCG tablet Take 150 mcg by mouth daily.      lovastatin (MEVACOR) 20 MG tablet Take 20 mg by mouth every evening.      methotrexate (RHEUMATREX) 2.5 MG tablet Take 2.5 mg by mouth once a week.       metoprolol tartrate (LOPRESSOR) 25 MG tablet Take 25 mg by mouth 2 (two) times daily.       potassium chloride (KLOR-CON M) 10 MEQ tablet Take 10 mEq by mouth daily.      predniSONE (DELTASONE) 1 MG tablet Take by mouth.      TRADJENTA 5 MG TABS tablet Take 5 mg by mouth daily.  warfarin (COUMADIN) 10 MG tablet Take 10 mg by mouth daily.      furosemide (LASIX) 20 MG tablet Take 1 tablet (20 mg total) by mouth daily. 30 tablet 5    JARDIANCE 10 MG TABS tablet Take 10 mg by mouth daily.   unk at unk   levothyroxine (SYNTHROID, LEVOTHROID) 137 MCG tablet Take 137 mcg by mouth daily before breakfast. (Patient not taking: Reported on 05/10/2022)   Not Taking   predniSONE (DELTASONE) 5 MG tablet Take 5 mg by mouth daily with breakfast.  (Patient not taking: Reported on 05/10/2022)   Not Taking    Assessment: Pharmacy consulted to dose heparin in this 72 year old female admitted for sepsis and hx of PAF and arterial thrombosis.   Pt was on warfarin 10 mg PO daily PTA  3/26:  INR @ 0327 = 2.8  Goal of Therapy:  Heparin level 0.3-0.7 units/ml Monitor platelets by anticoagulation protocol: Yes  Date Time: HL: Rate: 3/26 0932 0.13  Subtherapeutic/ 1700 > 2000 u/hr 3/26 1930 0.39 Therapeutic x1/ 2000 u/hr 3/26     2146   0.30     Therapeutic X 2 / 2000 un/hr 3/27     0214   0.36     Therapeutic X 3  3/28     0438   0.29     Subtherapeutic 3/28 1142 0.30 Therapeutic X  1 3/28 1921 0.30 Therapeutic X 2 3/29 0744 0.31 Therapeutic X 3 3/30     0504   0.28     SUBtherapeutic  3/30 1236 0.33 Therapeutic x 1 at 2400 u/hr  Plan:  3/30 1236  HL=0.33 Therapeutic x 1 Will continue drip rate at 2400 units/hr.  Will check confirmatory HL 6 hrs after rate change.  CBC daily  Noralee Space, PharmD Clinical Pharmacist 05/14/2022 2:03 PM

## 2022-05-14 NOTE — Progress Notes (Signed)
Progress Note  Patient Name: Kayla Reyes Date of Encounter: 05/14/2022  Primary Cardiologist: Fletcher Anon  Subjective   Maintaining sinus rhythm in the 80s to 90s bpm following DCCV yesterday morning. No chest pain, dyspnea, palpitations, or dizziness. Arterial line removed this morning. She is up sitting in bed and reports she is feeling better.     Inpatient Medications    Scheduled Meds:  vitamin C  500 mg Per Tube BID   Chlorhexidine Gluconate Cloth  6 each Topical Q0600   docusate  100 mg Per Tube BID   feeding supplement (VITAL 1.5 CAL)  1,000 mL Per Tube Q24H   free water  30 mL Per Tube Q4H   hydrocortisone sod succinate (SOLU-CORTEF) inj  100 mg Intravenous Daily   hydroxychloroquine  400 mg Oral Daily   insulin aspart  0-15 Units Subcutaneous Q4H   levalbuterol  0.63 mg Nebulization TID   And   ipratropium  0.5 mg Nebulization TID   leptospermum manuka honey  1 Application Topical Daily   levothyroxine  137 mcg Per Tube QAC breakfast   liver oil-zinc oxide   Topical TID   metoCLOPramide (REGLAN) injection  10 mg Intravenous Q8H   metoprolol tartrate  25 mg Oral BID   mupirocin ointment  1 Application Nasal BID   ondansetron (ZOFRAN) IV  4 mg Intravenous Q6H   pantoprazole (PROTONIX) IV  40 mg Intravenous Daily   polyethylene glycol  17 g Per Tube Daily   sodium chloride flush  10-40 mL Intracatheter Q12H   thiamine (VITAMIN B1) injection  100 mg Intravenous Daily   Continuous Infusions:  sodium chloride Stopped (05/10/22 1357)   sodium chloride Stopped (05/14/22 0942)   amiodarone 60 mg/hr (05/14/22 1000)   fluconazole (DIFLUCAN) IV Stopped (05/13/22 1722)   heparin 2,400 Units/hr (05/14/22 1000)   linezolid (ZYVOX) IV 300 mL/hr at 05/14/22 1000   norepinephrine (LEVOPHED) Adult infusion Stopped (05/13/22 0942)   promethazine (PHENERGAN) injection (IM or IVPB) Stopped (05/13/22 0533)   PRN Meds: acetaminophen **OR** acetaminophen, promethazine (PHENERGAN)  injection (IM or IVPB), sodium chloride flush   Vital Signs    Vitals:   05/14/22 0758 05/14/22 0800 05/14/22 0900 05/14/22 1000  BP:  (!) 90/53  109/74  Pulse:  90  88  Resp:  (!) 26 (!) 24 (!) 22  Temp:      TempSrc:      SpO2: 93% 95%  92%  Weight:      Height:        Intake/Output Summary (Last 24 hours) at 05/14/2022 1035 Last data filed at 05/14/2022 1000 Gross per 24 hour  Intake 2317.74 ml  Output 1065 ml  Net 1252.74 ml    Filed Weights   05/10/22 0322 05/11/22 0709 05/14/22 0500  Weight: (!) 167.1 kg (!) 172.5 kg (!) 172.1 kg    Telemetry    Maintaining sinus rhythm following DCCV on 3/29 with heart rates in the 80s to 90s bpm - Personally Reviewed  ECG    No new tracings - Personally Reviewed  Physical Exam   GEN: No acute distress.   Neck: JVD difficult to assess secondary to body habitus. Cardiac: RRR, no murmurs, rubs, or gallops.  Respiratory: Diminished breath sounds bilaterally.  GI: Soft, nontender, non-distended.   MS: No edema; No deformity. Neuro:  Alert and oriented x 3; Nonfocal.  Psych: Normal affect.  Labs    Chemistry Recent Labs  Lab 05/10/22 0327 05/10/22 1437 05/10/22 2122 05/10/22 2146  05/12/22 2259 05/13/22 0744 05/14/22 0509  NA 138 133* 137   < > 135 137 138  K 3.2* 3.7 3.6   < > 3.4* 3.7 3.1*  CL 108 103 105   < > 104 104 100  CO2 19* 15* 21*   < > 23 25 23   GLUCOSE 159* 174* 167*   < > 171* 115* 115*  BUN 18 23 24*   < > 20 21 21   CREATININE 1.94* 1.71* 1.32*   < > 0.87 0.84 0.79  CALCIUM 8.1* 7.8* 7.9*   < > 7.1* 7.7* 7.7*  PROT 6.3*  --  5.7*  --   --   --   --   ALBUMIN 3.0* 2.6* 2.7*  --   --   --   --   AST 49*  --  73*  --   --   --   --   ALT 19  --  27  --   --   --   --   ALKPHOS 29*  --  29*  --   --   --   --   BILITOT 0.8  --  0.8  --   --   --   --   GFRNONAA 28* 32* 44*   < > >60 >60 >60  ANIONGAP 11 15 11    < > 8 8 15    < > = values in this interval not displayed.      Hematology Recent  Labs  Lab 05/12/22 0357 05/13/22 0744 05/14/22 0509  WBC 15.3* 10.8* 9.0  RBC 4.31 4.14 4.26  HGB 13.3 12.6 12.9  HCT 39.9 38.4 38.1  MCV 92.6 92.8 89.4  MCH 30.9 30.4 30.3  MCHC 33.3 32.8 33.9  RDW 17.2* 17.2* 16.7*  PLT 171 PLATELET CLUMPS NOTED ON SMEAR, UNABLE TO ESTIMATE PLATELET CLUMPS NOTED ON SMEAR, UNABLE TO ESTIMATE     Cardiac EnzymesNo results for input(s): "TROPONINI" in the last 168 hours. No results for input(s): "TROPIPOC" in the last 168 hours.   BNP Recent Labs  Lab 05/10/22 0327  BNP 103.2*      DDimer No results for input(s): "DDIMER" in the last 168 hours.   Radiology    DG Chest Port 1 View  Result Date: 05/12/2022 IMPRESSION: Stable cardiomediastinal silhouette with mild central pulmonary vascular congestion. Aortic Atherosclerosis (ICD10-I70.0). Electronically Signed   By: Marijo Conception M.D.   On: 05/12/2022 07:45   DG Abd 1 View  Result Date: 05/12/2022 IMPRESSION: Enteric tube tip overlies the level of the stomach. Nonobstructive bowel gas pattern. Electronically Signed   By: Nolon Nations M.D.   On: 05/12/2022 07:44   DG Chest Port 1 View  Result Date: 05/10/2022 IMPRESSION: 1. Right-sided PICC line tip projects over the lower SVC. No pneumothorax. 2. Accentuated right infrahilar opacity, while some of this may be vascular, I cannot exclude infrahilar pneumonia or atelectasis. 3. Borderline enlargement of the cardiopericardial silhouette. 4.  Aortic Atherosclerosis (ICD10-I70.0). Electronically Signed   By: Van Clines M.D.   On: 05/10/2022 18:33   CT ABDOMEN PELVIS WO CONTRAST  Result Date: 05/10/2022 IMPRESSION: 1. No acute findings within the abdomen or pelvis. 2. Large pannus with marked diffuse skin thickening and subcutaneous soft tissue stranding. Correlate for any clinical signs or symptoms of cellulitis. No discrete fluid collection identified within the pannus to suggest abscess. 3. Hepatic steatosis. 4. Gallstones. 5.  Uterine fibroids. 6. Small fat containing umbilical hernia. 7. Laxity of  the ventral abdominal wall with anterior and inferior herniation bowel loops. 8. Aortic Atherosclerosis (ICD10-I70.0). Electronically Signed   By: Kerby Moors M.D.   On: 05/10/2022 13:02   DG Abd Portable 1V  Result Date: 05/10/2022 IMPRESSION: NG tube tip and side port are in the stomach. Electronically Signed   By: Yetta Glassman M.D.   On: 05/10/2022 11:08    Cardiac Studies   2D echo 05/10/2022: 1. Left ventricular ejection fraction, by estimation, is 55 to 60%. The  left ventricle has normal function. The left ventricle has no regional  wall motion abnormalities. Left ventricular diastolic parameters were  normal.   2. Right ventricular systolic function is normal. The right ventricular  size is normal. There is normal pulmonary artery systolic pressure.   3. The mitral valve is normal in structure. No evidence of mitral valve  regurgitation. No evidence of mitral stenosis.   4. The aortic valve is normal in structure. Aortic valve regurgitation is  not visualized. Aortic valve sclerosis is present, with no evidence of  aortic valve stenosis.   Patient Profile     72 y.o. 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 Mortimer Fries.   Assessment & Plan    1. PAF with RVR: -Has previously spontaneously converted on 3/26, though redeveloped Afib at 8:45 on 3/27, which persisted  -Status post successful  DCCV on 3/29, maintaining sinus rhythm - Septic shock previously requiring vasopressor support precluded addition of beta blocker or calcium channel blocker, though now on Lopressor with stable BP off vasopressor support  -High risk for recurrent arrhythmia, remains on amiodarone gtt, will reduce dose, look to transition to oral amiodarone, possibly in the next 24 hours  -CHADS2VASc 5 -PTA warfarin on hold -Heparin gtt  2. Severe sepsis with shock: -Improving -Due to infected infected panus -No longer requiring vasopressor support  -ABX per CCM  3. AKI: -Resolved  4. HFpEF: -Well compensated -Diurese as indicated  -Escalate GDMT as able throughout admission and in follow up  5. Hypokalemia: -Repletion ordered  6. History of arterial thrombus: -Heparin gtt for now -Resume PTA warfarin prior to discharge     Shared Decision Making/Informed Consent{  The risks (stroke, cardiac arrhythmias rarely resulting in the need for a temporary or permanent pacemaker, skin irritation or burns and complications associated with conscious sedation including aspiration, arrhythmia, respiratory failure and death), benefits (restoration of normal sinus rhythm) and alternatives of a direct current cardioversion were explained in detail to Ms. Elgin and she agrees to proceed.        For questions or updates, please contact [place] Please consult [email or web address] for contact info under Cardiology/STEMI.    Signed, Christell Faith, PA-C [facility] Pager: [phone] 05/14/2022, 10:35 AM

## 2022-05-14 NOTE — Progress Notes (Signed)
Pharmacy Antibiotic Note  Kayla Reyes is a 72 y.o. female admitted on 05/10/2022 with sepsis, acute renal failure, Yeast present on pannus.  Pharmacy has been consulted for Fluconazole dosing   MRSA PCR positive-on Linezolid  Plan: Day 5- Fluconazole 200mg  IV q24h   Crcl 110.9 ml/min  Will continue to monitor and adjust dose as appropriate.   Height: 5\' 7"  (170.2 cm) Weight: (!) 172.1 kg (379 lb 6.6 oz) IBW/kg (Calculated) : 61.6  Temp (24hrs), Avg:98.3 F (36.8 C), Min:98.2 F (36.8 C), Max:98.3 F (36.8 C)  Recent Labs  Lab 05/10/22 0327 05/10/22 0532 05/10/22 2122 05/10/22 2146 05/11/22 0410 05/11/22 0800 05/11/22 1042 05/11/22 1936 05/12/22 0357 05/12/22 2259 05/13/22 0744 05/13/22 0849 05/13/22 1331 05/14/22 0509  WBC 4.4  --  12.0*  --   --   --   --   --  15.3*  --  10.8*  --   --  9.0  CREATININE 1.94*   < > 1.32*   < >  --   --    < > 1.11* 0.95 0.87 0.84  --   --  0.79  LATICACIDVEN  --    < > 4.6*  --  4.1*  --   --   --  2.2*  --   --  2.1* 2.6*  --   VANCORANDOM  --   --   --   --   --  8  --   --   --   --   --   --   --   --    < > = values in this interval not displayed.     Estimated Creatinine Clearance: 110.9 mL/min (by C-G formula based on SCr of 0.79 mg/dL).    No Known Allergies  Antimicrobials this admission: Vancomycin  3/26 >> 3/27 Meropenem  3/26 >> 3/28 Fluconazole 3/26 >> Linezolid 3/18>>  Dose adjustments this admission:   Microbiology results: 3/27 BCx: NGTD 3/27 Resp cx: NGTD 3/26 MRSA PCR: detected  Thank you for allowing pharmacy to be a part of this patient's care.  [name] A 05/14/2022 9:40 AM

## 2022-05-15 ENCOUNTER — Encounter: Payer: Self-pay | Admitting: Cardiovascular Disease

## 2022-05-15 DIAGNOSIS — I4891 Unspecified atrial fibrillation: Secondary | ICD-10-CM | POA: Diagnosis not present

## 2022-05-15 LAB — BASIC METABOLIC PANEL
Anion gap: 8 (ref 5–15)
BUN: 21 mg/dL (ref 8–23)
CO2: 25 mmol/L (ref 22–32)
Calcium: 7.6 mg/dL — ABNORMAL LOW (ref 8.9–10.3)
Chloride: 105 mmol/L (ref 98–111)
Creatinine, Ser: 0.78 mg/dL (ref 0.44–1.00)
GFR, Estimated: >60 mL/min (ref >60)
Glucose, Bld: 109 mg/dL — ABNORMAL HIGH (ref 70–99)
Potassium: 3.4 mmol/L — ABNORMAL LOW (ref 3.5–5.1)
Sodium: 138 mmol/L (ref 135–145)

## 2022-05-15 LAB — CBC
HCT: 37.8 % (ref 36.0–46.0)
Hemoglobin: 12.9 g/dL (ref 12.0–15.0)
MCH: 29.9 pg (ref 26.0–34.0)
MCHC: 34.1 g/dL (ref 30.0–36.0)
MCV: 87.7 fL (ref 80.0–100.0)
Platelets: 85 10*3/uL — ABNORMAL LOW (ref 150–400)
RBC: 4.31 MIL/uL (ref 3.87–5.11)
RDW: 16.7 % — ABNORMAL HIGH (ref 11.5–15.5)
WBC: 10 10*3/uL (ref 4.0–10.5)
nRBC: 0.2 % (ref 0.0–0.2)

## 2022-05-15 LAB — CULTURE, BLOOD (ROUTINE X 2)
Culture: NO GROWTH
Culture: NO GROWTH

## 2022-05-15 LAB — HEPARIN LEVEL (UNFRACTIONATED)
Heparin Unfractionated: 0.63 IU/mL (ref 0.30–0.70)
Heparin Unfractionated: 0.82 IU/mL — ABNORMAL HIGH (ref 0.30–0.70)
Heparin Unfractionated: 0.89 IU/mL — ABNORMAL HIGH (ref 0.30–0.70)

## 2022-05-15 LAB — GLUCOSE, CAPILLARY
Glucose-Capillary: 103 mg/dL — ABNORMAL HIGH (ref 70–99)
Glucose-Capillary: 101 mg/dL — ABNORMAL HIGH (ref 70–99)
Glucose-Capillary: 119 mg/dL — ABNORMAL HIGH (ref 70–99)
Glucose-Capillary: 153 mg/dL — ABNORMAL HIGH (ref 70–99)
Glucose-Capillary: 151 mg/dL — ABNORMAL HIGH (ref 70–99)

## 2022-05-15 LAB — MAGNESIUM: Magnesium: 2.4 mg/dL (ref 1.7–2.4)

## 2022-05-15 LAB — PHOSPHORUS: Phosphorus: 3.5 mg/dL (ref 2.5–4.6)

## 2022-05-15 MED ORDER — POTASSIUM CHLORIDE 20 MEQ PO PACK
40.0000 meq | PACK | ORAL | Status: AC
  Administered 2022-05-15 (×2): 40 meq
  Filled 2022-05-15 (×2): qty 2

## 2022-05-15 MED ORDER — INSULIN ASPART 100 UNIT/ML IJ SOLN
0.0000 [IU] | Freq: Three times a day (TID) | INTRAMUSCULAR | Status: DC
  Administered 2022-05-16 – 2022-05-17 (×5): 3 [IU] via SUBCUTANEOUS
  Administered 2022-05-17: 2 [IU] via SUBCUTANEOUS
  Filled 2022-05-15 (×6): qty 1

## 2022-05-15 MED ORDER — POTASSIUM CHLORIDE 20 MEQ PO PACK: 40.0000 meq | PACK | Freq: Once | ORAL | Status: DC

## 2022-05-15 NOTE — Progress Notes (Signed)
ANTICOAGULATION CONSULT NOTE -   Pharmacy Consult for Heparin Indication:  arterial thrombosis , Afib  No Known Allergies  Patient Measurements: Height: 5\' 7"  (170.2 cm) Weight: (!) 174.4 kg (384 lb 7.7 oz) IBW/kg (Calculated) : 61.6 Heparin Dosing Weight: 104 kg   Vital Signs: Temp: 97.8 F (36.6 C) (03/31 1008) Temp Source: Oral (03/31 1008) BP: 87/64 (03/31 2000) Pulse Rate: 81 (03/31 2000)  Labs: Recent Labs    05/13/22 0744 05/13/22 0849 05/14/22 0509 05/14/22 1236 05/15/22 0637 05/15/22 1242 05/15/22 2010  HGB 12.6  --  12.9  --  12.9  --   --   HCT 38.4  --  38.1  --  37.8  --   --   PLT PLATELET CLUMPS NOTED ON SMEAR, UNABLE TO ESTIMATE  --  PLATELET CLUMPS NOTED ON SMEAR, UNABLE TO ESTIMATE  --  85*  --   --   LABPROT  --  27.7*  --   --   --   --   --   INR  --  2.6*  --   --   --   --   --   HEPARINUNFRC 0.31  --  0.28*   < > 0.63 0.82* 0.89*  CREATININE 0.84  --  0.79  --  0.78  --   --    < > = values in this interval not displayed.     Estimated Creatinine Clearance: 111.8 mL/min (by C-G formula based on SCr of 0.78 mg/dL).   Medical History: Past Medical History:  Diagnosis Date   Arterial thrombosis ([place])    a. s/p thromboectomy and thrombolysis with left BKA in 2008 on chronic Coumadin    HLD (hyperlipidemia)    Hypertension    Hypothyroidism    Obesity    PAF (paroxysmal atrial fibrillation) ([place])    a. noted 12/19 in the setting of hypokalemia; b. CHADS2VASc 4 (HTN, age x 1, vascular disease, female); c. on chronic Coumadin in the setting of prior arterial clot   Rheumatoid arthritis ([place])     Medications:  Medications Prior to Admission  Medication Sig Dispense Refill Last Dose   albuterol (PROVENTIL HFA;VENTOLIN HFA) 108 (90 Base) MCG/ACT inhaler Inhale 2 puffs into the lungs every 6 (six) hours as needed for wheezing or shortness of breath.   prn at unk   fluticasone (FLONASE) 50 MCG/ACT nasal spray Place 1 spray into both nostrils  daily as needed for allergies or rhinitis.   prn at unk   furosemide (LASIX) 40 MG tablet Take 40 mg by mouth daily.      gabapentin (NEURONTIN) 600 MG tablet Take 600 mg by mouth 3 (three) times daily.      hydroxychloroquine (PLAQUENIL) 200 MG tablet TAKE 2 TABLETS(400 MG) BY MOUTH EVERY DAY      levothyroxine (SYNTHROID) 150 MCG tablet Take 150 mcg by mouth daily.      lovastatin (MEVACOR) 20 MG tablet Take 20 mg by mouth every evening.      methotrexate (RHEUMATREX) 2.5 MG tablet Take 2.5 mg by mouth once a week.       metoprolol tartrate (LOPRESSOR) 25 MG tablet Take 25 mg by mouth 2 (two) times daily.       potassium chloride (KLOR-CON M) 10 MEQ tablet Take 10 mEq by mouth daily.      predniSONE (DELTASONE) 1 MG tablet Take by mouth.      TRADJENTA 5 MG TABS tablet Take 5 mg by mouth daily.  warfarin (COUMADIN) 10 MG tablet Take 10 mg by mouth daily.      furosemide (LASIX) 20 MG tablet Take 1 tablet (20 mg total) by mouth daily. 30 tablet 5    JARDIANCE 10 MG TABS tablet Take 10 mg by mouth daily.   unk at unk   levothyroxine (SYNTHROID, LEVOTHROID) 137 MCG tablet Take 137 mcg by mouth daily before breakfast. (Patient not taking: Reported on 05/10/2022)   Not Taking   predniSONE (DELTASONE) 5 MG tablet Take 5 mg by mouth daily with breakfast.  (Patient not taking: Reported on 05/10/2022)   Not Taking    Assessment: Pharmacy consulted to dose heparin in this 72 year old female admitted for sepsis and hx of PAF and arterial thrombosis.   Pt was on warfarin 10 mg PO daily PTA  3/26:  INR @ 0327 = 2.8  Goal of Therapy:  Heparin level 0.3-0.7 units/ml Monitor platelets by anticoagulation protocol: Yes  Date Time: HL: Rate: 3/30 1236 0.33 Therapeutic x 1 at 2400 u/hr 3/30     1833  < 0.1      SUBtherapeutic @ 2400 units/hr - STAT redraw ordered -  3/30     2112   < 0.1     SUBtherapeutic @ 2400 units/hr 3/31     0637    0.63    Therapeutic X 1 @ 2800  units/hr 3/31 1242 0.82 SUPRAtherapeutic, 2800 u/hr >2600 u/hr 3/31 2010 0.89 Supratherapeutic @ 2600 u/hr  Plan:  Decrease heparin infusion rate to 2400 units/hr Recheck heparin level 6 hours after rate is changed Daily CBC while on heparin  Lorin Picket, PharmD Clinical Pharmacist 05/15/2022 8:29 PM

## 2022-05-15 NOTE — Progress Notes (Signed)
ANTICOAGULATION CONSULT NOTE -   Pharmacy Consult for Heparin Indication:  arterial thrombosis , Afib  No Known Allergies  Patient Measurements: Height: 5\' 7"  (170.2 cm) Weight: (!) 174.4 kg (384 lb 7.7 oz) IBW/kg (Calculated) : 61.6 Heparin Dosing Weight: 104 kg   Vital Signs: Temp: 97.8 F (36.6 C) (03/31 1008) Temp Source: Oral (03/31 1008) BP: 130/114 (03/31 1330) Pulse Rate: 80 (03/31 1330)  Labs: Recent Labs    05/13/22 0744 05/13/22 0849 05/14/22 0509 05/14/22 1236 05/14/22 2112 05/15/22 0637 05/15/22 1242  HGB 12.6  --  12.9  --   --  12.9  --   HCT 38.4  --  38.1  --   --  37.8  --   PLT PLATELET CLUMPS NOTED ON SMEAR, UNABLE TO ESTIMATE  --  PLATELET CLUMPS NOTED ON SMEAR, UNABLE TO ESTIMATE  --   --  85*  --   LABPROT  --  27.7*  --   --   --   --   --   INR  --  2.6*  --   --   --   --   --   HEPARINUNFRC 0.31  --  0.28*   < > <0.10* 0.63 0.82*  CREATININE 0.84  --  0.79  --   --  0.78  --    < > = values in this interval not displayed.     Estimated Creatinine Clearance: 111.8 mL/min (by C-G formula based on SCr of 0.78 mg/dL).   Medical History: Past Medical History:  Diagnosis Date   Arterial thrombosis ([place])    a. s/p thromboectomy and thrombolysis with left BKA in 2008 on chronic Coumadin    HLD (hyperlipidemia)    Hypertension    Hypothyroidism    Obesity    PAF (paroxysmal atrial fibrillation) ([place])    a. noted 12/19 in the setting of hypokalemia; b. CHADS2VASc 4 (HTN, age x 1, vascular disease, female); c. on chronic Coumadin in the setting of prior arterial clot   Rheumatoid arthritis ([place])     Medications:  Medications Prior to Admission  Medication Sig Dispense Refill Last Dose   albuterol (PROVENTIL HFA;VENTOLIN HFA) 108 (90 Base) MCG/ACT inhaler Inhale 2 puffs into the lungs every 6 (six) hours as needed for wheezing or shortness of breath.   prn at unk   fluticasone (FLONASE) 50 MCG/ACT nasal spray Place 1 spray into both  nostrils daily as needed for allergies or rhinitis.   prn at unk   furosemide (LASIX) 40 MG tablet Take 40 mg by mouth daily.      gabapentin (NEURONTIN) 600 MG tablet Take 600 mg by mouth 3 (three) times daily.      hydroxychloroquine (PLAQUENIL) 200 MG tablet TAKE 2 TABLETS(400 MG) BY MOUTH EVERY DAY      levothyroxine (SYNTHROID) 150 MCG tablet Take 150 mcg by mouth daily.      lovastatin (MEVACOR) 20 MG tablet Take 20 mg by mouth every evening.      methotrexate (RHEUMATREX) 2.5 MG tablet Take 2.5 mg by mouth once a week.       metoprolol tartrate (LOPRESSOR) 25 MG tablet Take 25 mg by mouth 2 (two) times daily.       potassium chloride (KLOR-CON M) 10 MEQ tablet Take 10 mEq by mouth daily.      predniSONE (DELTASONE) 1 MG tablet Take by mouth.      TRADJENTA 5 MG TABS tablet Take 5 mg by mouth daily.  warfarin (COUMADIN) 10 MG tablet Take 10 mg by mouth daily.      furosemide (LASIX) 20 MG tablet Take 1 tablet (20 mg total) by mouth daily. 30 tablet 5    JARDIANCE 10 MG TABS tablet Take 10 mg by mouth daily.   unk at unk   levothyroxine (SYNTHROID, LEVOTHROID) 137 MCG tablet Take 137 mcg by mouth daily before breakfast. (Patient not taking: Reported on 05/10/2022)   Not Taking   predniSONE (DELTASONE) 5 MG tablet Take 5 mg by mouth daily with breakfast.  (Patient not taking: Reported on 05/10/2022)   Not Taking    Assessment: Pharmacy consulted to dose heparin in this 72 year old female admitted for sepsis and hx of PAF and arterial thrombosis.   Pt was on warfarin 10 mg PO daily PTA  3/26:  INR @ 0327 = 2.8  Goal of Therapy:  Heparin level 0.3-0.7 units/ml Monitor platelets by anticoagulation protocol: Yes  Date Time: HL: Rate: 3/26 0932 0.13  Subtherapeutic/ 1700 > 2000 u/hr 3/26 1930 0.39 Therapeutic x1/ 2000 u/hr 3/26     2146   0.30     Therapeutic X 2 / 2000 un/hr 3/27     0214   0.36     Therapeutic X 3  3/28     0438   0.29     Subtherapeutic 3/28 1142 0.30 Therapeutic  X 1 3/28 1921 0.30 Therapeutic X 2 3/29 0744 0.31 Therapeutic X 3 3/30     0504   0.28     SUBtherapeutic  3/30 1236 0.33 Therapeutic x 1 at 2400 u/hr 3/30     1833  < 0.1      SUBtherapeutic @ 2400 units/hr - STAT redraw ordered -  3/30     2112   < 0.1     SUBtherapeutic @ 2400 units/hr 3/31     0637    0.63    Therapeutic X 1 @ 2800 units/hr 3/31 1242 0.82 SUPRAtherapeutic, 2800 u/hr >2600 u/hr  Plan:  3/31  1242  HL=0.82 SUPRAtherapeutic - Will decrease Heparin drip from 2800 units/hr to 2600 units/hr -Plt 85 - Will recheck HL 6 hrs after rate change CBC daily  Noralee Space, PharmD Clinical Pharmacist 05/15/2022 1:44 PM

## 2022-05-15 NOTE — Progress Notes (Signed)
Pharmacy Antibiotic Note  Kayla Reyes is a 72 y.o. female admitted on 05/10/2022 with sepsis, acute renal failure, Yeast present on pannus.  Pharmacy has been consulted for Fluconazole dosing -hx L BKA -on Plaquenil, Methotrexate PTA   MRSA PCR positive-on Linezolid  Plt 85  Plan: Day 6- Fluconazole 200mg  IV q24h   Crcl 111 ml/min Afebrile,  WBC 10.0  Will continue to monitor and adjust dose as appropriate.   Height: 5\' 7"  (170.2 cm) Weight: (!) 174.4 kg (384 lb 7.7 oz) IBW/kg (Calculated) : 61.6  Temp (24hrs), Avg:98.6 F (37 C), Min:98.3 F (36.8 C), Max:98.9 F (37.2 C)  Recent Labs  Lab 05/10/22 2122 05/10/22 2146 05/11/22 0410 05/11/22 0800 05/11/22 1042 05/12/22 0357 05/12/22 2259 05/13/22 0744 05/13/22 0849 05/13/22 1331 05/14/22 0509 05/15/22 0637  WBC 12.0*  --   --   --   --  15.3*  --  10.8*  --   --  9.0 10.0  CREATININE 1.32*   < >  --   --    < > 0.95 0.87 0.84  --   --  0.79 0.78  LATICACIDVEN 4.6*  --  4.1*  --   --  2.2*  --   --  2.1* 2.6*  --   --   VANCORANDOM  --   --   --  8  --   --   --   --   --   --   --   --    < > = values in this interval not displayed.     Estimated Creatinine Clearance: 111.8 mL/min (by C-G formula based on SCr of 0.78 mg/dL).    No Known Allergies  Antimicrobials this admission: Vancomycin  3/26 >> 3/27 Meropenem  3/26 >> 3/28 Fluconazole 3/26 >> Linezolid 3/18>>  Dose adjustments this admission:   Microbiology results: 3/27 BCx: NGTD 3/27 Resp cx: NGTD 3/26 MRSA PCR: detected  Thank you for allowing pharmacy to be a part of this patient's care.  Tarika Mckethan A 05/15/2022 8:51 AM

## 2022-05-15 NOTE — Progress Notes (Addendum)
Progress Note  Patient Name: Kayla Reyes Date of Encounter: 05/15/2022  Primary Cardiologist: Fletcher Anon  Subjective   She redeveloped A-fib with RVR around noon on 3/30 with subsequent conversion to sinus rhythm around  2100 following IV amiodarone bolus and increased rate of amiodarone drip.  She maintained sinus rhythm until 0648 on 3/31, at which time she redeveloped A-fib with RVR which persisted until 0810 when she spontaneously converted to sinus rhythm. She redeveloped Afib with RVR at 0855, which persists at this time. No chest pain, dyspnea or palpitations.   Inpatient Medications    Scheduled Meds:  vitamin C  500 mg Per Tube BID   Chlorhexidine Gluconate Cloth  6 each Topical Daily   docusate  100 mg Per Tube BID   free water  30 mL Per Tube Q4H   hydrocortisone sod succinate (SOLU-CORTEF) inj  100 mg Intravenous Daily   hydroxychloroquine  400 mg Oral Daily   insulin aspart  0-15 Units Subcutaneous Q4H   levalbuterol  0.63 mg Nebulization TID   And   ipratropium  0.5 mg Nebulization TID   leptospermum manuka honey  1 Application Topical Daily   levothyroxine  137 mcg Per Tube QAC breakfast   liver oil-zinc oxide   Topical TID   metoCLOPramide (REGLAN) injection  10 mg Intravenous Q8H   metoprolol tartrate  25 mg Oral BID   ondansetron (ZOFRAN) IV  4 mg Intravenous Q6H   pantoprazole (PROTONIX) IV  40 mg Intravenous Daily   polyethylene glycol  17 g Per Tube Daily   potassium chloride  40 mEq Per Tube Q4H   sodium chloride flush  10-40 mL Intracatheter Q12H   thiamine (VITAMIN B1) injection  100 mg Intravenous Daily   Continuous Infusions:  sodium chloride 10 mL/hr at 05/14/22 1900   sodium chloride 10 mL/hr at 05/15/22 0700   amiodarone 60 mg/hr (05/15/22 0700)   fluconazole (DIFLUCAN) IV Stopped (05/14/22 1748)   heparin 2,800 Units/hr (05/15/22 0700)   linezolid (ZYVOX) IV Stopped (05/14/22 2300)   promethazine (PHENERGAN) injection (IM or IVPB) Stopped  (05/13/22 0533)   PRN Meds: acetaminophen **OR** acetaminophen, promethazine (PHENERGAN) injection (IM or IVPB), sodium chloride flush   Vital Signs    Vitals:   05/15/22 0650 05/15/22 0705 05/15/22 0730 05/15/22 0745  BP:  111/71 105/89   Pulse: (!) 111 (!) 125 (!) 144   Resp: (!) 26 19 (!) 22   Temp:      TempSrc:      SpO2: 92% 91% 91% 93%  Weight:      Height:        Intake/Output Summary (Last 24 hours) at 05/15/2022 0903 Last data filed at 05/15/2022 0700 Gross per 24 hour  Intake 2750.76 ml  Output 1165 ml  Net 1585.76 ml   Filed Weights   05/11/22 0709 05/14/22 0500 05/15/22 0402  Weight: (!) 172.5 kg (!) 172.1 kg (!) 174.4 kg    Telemetry    SR with  A-fib with RVR developing around noon on 3/30 with subsequent conversion to sinus rhythm around  2100.  Maintained sinus rhythm until 0648 on 3/31, at which time she redeveloped A-fib with RVR which persisted until 0810 when she spontaneously converted to sinus rhythm. She redeveloped Afib with RVR at 0855, which persists at this time with ventricular rates in the 120s to 150s bpm - Personally Reviewed  ECG    3/30, A-fib with RVR, 125 bpm, nonspecific ST-T changes - Personally Reviewed  Physical Exam   GEN: No acute distress.   Neck: No JVD. Cardiac: Tachycardic, IRIR, no murmurs, rubs, or gallops.  Respiratory: Diminished breath sounds bilaterally.  GI: Soft, nontender, non-distended.   MS: No edema. Neuro:  Alert and oriented x 3; Nonfocal.  Psych: Normal affect.  Labs    Chemistry Recent Labs  Lab 05/10/22 0327 05/10/22 1437 05/10/22 2122 05/10/22 2146 05/13/22 0744 05/14/22 0509 05/14/22 1833 05/15/22 0637  NA 138 133* 137   < > 137 138  --  138  K 3.2* 3.7 3.6   < > 3.7 3.1* 3.4* 3.4*  CL 108 103 105   < > 104 100  --  105  CO2 19* 15* 21*   < > 25 23  --  25  GLUCOSE 159* 174* 167*   < > 115* 115*  --  109*  BUN 18 23 24*   < > 21 21  --  21  CREATININE 1.94* 1.71* 1.32*   < > 0.84 0.79   --  0.78  CALCIUM 8.1* 7.8* 7.9*   < > 7.7* 7.7*  --  7.6*  PROT 6.3*  --  5.7*  --   --   --   --   --   ALBUMIN 3.0* 2.6* 2.7*  --   --   --   --   --   AST 49*  --  73*  --   --   --   --   --   ALT 19  --  27  --   --   --   --   --   ALKPHOS 29*  --  29*  --   --   --   --   --   BILITOT 0.8  --  0.8  --   --   --   --   --   GFRNONAA 28* 32* 44*   < > >60 >60  --  >60  ANIONGAP 11 15 11    < > 8 15  --  8   < > = values in this interval not displayed.     Hematology Recent Labs  Lab 05/13/22 0744 05/14/22 0509 05/15/22 0637  WBC 10.8* 9.0 10.0  RBC 4.14 4.26 4.31  HGB 12.6 12.9 12.9  HCT 38.4 38.1 37.8  MCV 92.8 89.4 87.7  MCH 30.4 30.3 29.9  MCHC 32.8 33.9 34.1  RDW 17.2* 16.7* 16.7*  PLT PLATELET CLUMPS NOTED ON SMEAR, UNABLE TO ESTIMATE PLATELET CLUMPS NOTED ON SMEAR, UNABLE TO ESTIMATE 85*    Cardiac EnzymesNo results for input(s): "TROPONINI" in the last 168 hours. No results for input(s): "TROPIPOC" in the last 168 hours.   BNP Recent Labs  Lab 05/10/22 0327  BNP 103.2*     DDimer No results for input(s): "DDIMER" in the last 168 hours.   Radiology    No results found.  Cardiac Studies   2D echo 05/10/2022: 1. Left ventricular ejection fraction, by estimation, is 55 to 60%. The  left ventricle has normal function. The left ventricle has no regional  wall motion abnormalities. Left ventricular diastolic parameters were  normal.   2. Right ventricular systolic function is normal. The right ventricular  size is normal. There is normal pulmonary artery systolic pressure.   3. The mitral valve is normal in structure. No evidence of mitral valve  regurgitation. No evidence of mitral stenosis.   4. The aortic valve is normal in structure. Aortic valve regurgitation  is  not visualized. Aortic valve sclerosis is present, with no evidence of  aortic valve stenosis.   Patient Profile     71 y.o. 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 Mortimer Fries.   Assessment & Plan    1. PAF with RVR: -Has previously spontaneously converted on 3/26, though redeveloped Afib at 8:45 on 3/27, which persisted  -Status post successful DCCV on 3/29 -Maintained sinus rhythm until the afternoon of 3/30 at which time she redeveloped A-fib with RVR -Following IV amiodarone bolus and increased amiodarone drip rate she spontaneously converted to sinus rhythm in the evening of 3/30 with redevelopment of A-fib with RVR on the morning of 3/31  with brief spontaneous conversion to sinus rhythm followed by redevelopment of Afib with RVR, which persists at this time -May need to consider repeat DCCV when infection improves, though in the setting of her acute illness, she remains high risk for recurrent arrhythmia and she has previously demonstrated spontaneous conversion -Continue IV amiodarone at 60 mg/hr -Would benefit from EP evaluation/input - Septic shock previously requiring vasopressor support precluded addition of beta blocker or calcium channel blocker, though now on Lopressor with stable BP off vasopressor support  -CHADS2VASc 5 -PTA warfarin on hold -Heparin gtt   2. Severe sepsis with shock: -Improving -Due to infected infected panus -No longer requiring vasopressor support  -ABX per CCM   3. AKI: -Resolved   4. HFpEF: -Well compensated -Diurese as indicated  -Escalate GDMT as able throughout admission and in follow up   5.  Hypokalemia: -Replete per ICU protocol   6. History of arterial thrombus: -Heparin gtt for now -Resume PTA warfarin prior to discharge     For questions or updates, please contact [place] Please consult [email or web address] for contact info under Cardiology/STEMI.    Signed, Christell Faith, PA-C [place] Pager: [phone] 05/15/2022, 9:03 AM

## 2022-05-15 NOTE — Consult Note (Signed)
PHARMACY CONSULT NOTE - FOLLOW UP  Pharmacy Consult for Electrolyte Monitoring and Replacement   Recent Labs: Potassium (mmol/L)  Date Value  05/15/2022 3.4 (L)   Magnesium (mg/dL)  Date Value  05/15/2022 2.4   Calcium (mg/dL)  Date Value  05/15/2022 7.6 (L)   Albumin (g/dL)  Date Value  05/10/2022 2.7 (L)  02/19/2018 3.1 (L)   Phosphorus (mg/dL)  Date Value  05/15/2022 3.5   Sodium (mmol/L)  Date Value  05/15/2022 138  02/19/2018 137   Calcium: Ca 7.7  alb 2.7 (3/26) Corrected Ca=8.6  Assessment: 72 yo F presenting to [facility] ED from home via EMS on 05/10/22 for evaluation of altered mental status. Family and patient report that she was in her normal state of health until late in the evening on 05/09/22 when she began having significant chills. Then she became less responsive prompting family to call EMS. Pharmacy has been consulted to monitor and replace electrolytes while under PCCM care. -s/p cardioversion 3/29  Goal of Therapy:  Electrolytes WNL  Plan:  -K 3.4  Mag 2.4  Phos 3.5  Scr 0.78 -Give Kcl 40 mEq packet q4h x 2 doses -Recheck electrolytes with AM labs   Noralee Space ,PharmD Clinical Pharmacist 05/15/2022 8:42 AM

## 2022-05-15 NOTE — Progress Notes (Signed)
ANTICOAGULATION CONSULT NOTE -   Pharmacy Consult for Heparin Indication:  arterial thrombosis  No Known Allergies  Patient Measurements: Height: 5\' 7"  (170.2 cm) Weight: (!) 174.4 kg (384 lb 7.7 oz) IBW/kg (Calculated) : 61.6 Heparin Dosing Weight: 104 kg   Vital Signs: Temp: 98.6 F (37 C) (03/31 0100) Temp Source: Oral (03/31 0100) BP: 100/64 (03/31 0600) Pulse Rate: 80 (03/31 0600)  Labs: Recent Labs    05/13/22 0744 05/13/22 0849 05/14/22 0509 05/14/22 1236 05/14/22 1833 05/14/22 2112 05/15/22 0637  HGB 12.6  --  12.9  --   --   --   --   HCT 38.4  --  38.1  --   --   --   --   PLT PLATELET CLUMPS NOTED ON SMEAR, UNABLE TO ESTIMATE  --  PLATELET CLUMPS NOTED ON SMEAR, UNABLE TO ESTIMATE  --   --   --   --   LABPROT  --  27.7*  --   --   --   --   --   INR  --  2.6*  --   --   --   --   --   HEPARINUNFRC 0.31  --  0.28*   < > <0.10* <0.10* 0.63  CREATININE 0.84  --  0.79  --   --   --  0.78   < > = values in this interval not displayed.     Estimated Creatinine Clearance: 111.8 mL/min (by C-G formula based on SCr of 0.78 mg/dL).   Medical History: Past Medical History:  Diagnosis Date   Arterial thrombosis ([place])    a. s/p thromboectomy and thrombolysis with left BKA in 2008 on chronic Coumadin    HLD (hyperlipidemia)    Hypertension    Hypothyroidism    Obesity    PAF (paroxysmal atrial fibrillation) ([place])    a. noted 12/19 in the setting of hypokalemia; b. CHADS2VASc 4 (HTN, age x 1, vascular disease, female); c. on chronic Coumadin in the setting of prior arterial clot   Rheumatoid arthritis ([place])     Medications:  Medications Prior to Admission  Medication Sig Dispense Refill Last Dose   albuterol (PROVENTIL HFA;VENTOLIN HFA) 108 (90 Base) MCG/ACT inhaler Inhale 2 puffs into the lungs every 6 (six) hours as needed for wheezing or shortness of breath.   prn at unk   fluticasone (FLONASE) 50 MCG/ACT nasal spray Place 1 spray into both nostrils daily  as needed for allergies or rhinitis.   prn at unk   furosemide (LASIX) 40 MG tablet Take 40 mg by mouth daily.      gabapentin (NEURONTIN) 600 MG tablet Take 600 mg by mouth 3 (three) times daily.      hydroxychloroquine (PLAQUENIL) 200 MG tablet TAKE 2 TABLETS(400 MG) BY MOUTH EVERY DAY      levothyroxine (SYNTHROID) 150 MCG tablet Take 150 mcg by mouth daily.      lovastatin (MEVACOR) 20 MG tablet Take 20 mg by mouth every evening.      methotrexate (RHEUMATREX) 2.5 MG tablet Take 2.5 mg by mouth once a week.       metoprolol tartrate (LOPRESSOR) 25 MG tablet Take 25 mg by mouth 2 (two) times daily.       potassium chloride (KLOR-CON M) 10 MEQ tablet Take 10 mEq by mouth daily.      predniSONE (DELTASONE) 1 MG tablet Take by mouth.      TRADJENTA 5 MG TABS tablet Take 5 mg  by mouth daily.      warfarin (COUMADIN) 10 MG tablet Take 10 mg by mouth daily.      furosemide (LASIX) 20 MG tablet Take 1 tablet (20 mg total) by mouth daily. 30 tablet 5    JARDIANCE 10 MG TABS tablet Take 10 mg by mouth daily.   unk at unk   levothyroxine (SYNTHROID, LEVOTHROID) 137 MCG tablet Take 137 mcg by mouth daily before breakfast. (Patient not taking: Reported on 05/10/2022)   Not Taking   predniSONE (DELTASONE) 5 MG tablet Take 5 mg by mouth daily with breakfast.  (Patient not taking: Reported on 05/10/2022)   Not Taking    Assessment: Pharmacy consulted to dose heparin in this 72 year old female admitted for sepsis and hx of PAF and arterial thrombosis.   Pt was on warfarin 10 mg PO daily PTA  3/26:  INR @ 0327 = 2.8  Goal of Therapy:  Heparin level 0.3-0.7 units/ml Monitor platelets by anticoagulation protocol: Yes  Date Time: HL: Rate: 3/26 0932 0.13  Subtherapeutic/ 1700 > 2000 u/hr 3/26 1930 0.39 Therapeutic x1/ 2000 u/hr 3/26     2146   0.30     Therapeutic X 2 / 2000 un/hr 3/27     0214   0.36     Therapeutic X 3  3/28     0438   0.29     Subtherapeutic 3/28 1142 0.30 Therapeutic X  1 3/28 1921 0.30 Therapeutic X 2 3/29 0744 0.31 Therapeutic X 3 3/30     0504   0.28     SUBtherapeutic  3/30 1236 0.33 Therapeutic x 1 at 2400 u/hr 3/30     1833  < 0.1      SUBtherapeutic @ 2400 units/hr - STAT redraw ordered -  3/30     2112   < 0.1     SUBtherapeutic @ 2400 units/hr 3/31     0637     0.63    Therapeutic X 1 @ 2800 units/hr  Plan:  3/31:  HL @ [id number] = 0.63, therapeutic X 1 - Will continue pt on current rate and recheck HL on 3/31 @ 1300.  - Will recheck HL 6 hrs after rate change CBC daily  Ahron Hulbert D, PharmD Clinical Pharmacist 05/15/2022 7:06 AM

## 2022-05-15 NOTE — Progress Notes (Signed)
PROGRESS NOTE    Kayla Reyes  [id number]  DOB: [birth year]  DOA: 05/10/2022 PCP: Marguerita Merles, MD Outpatient Specialists:   Hospital course:  72 year old female was admitted with sepsis.  Workup was unrevealing except for infection under patient's large pannus.  She has been treated with broad-spectrum antibiotics is now presently on linezolid and fluconazole.  She has been weaned off of pressors.   Patient is also had atrial fibrillation with RVR.  She underwent DCCV 3/29 but now has reverted back to A-fib today.  Subjective:  Patient thinks she is feeling better.  Knows that her heart is going fast but denies any chest discomfort.  Ate well yesterday.  Objective: Vitals:   05/15/22 1407 05/15/22 1430 05/15/22 1530 05/15/22 1600  BP:  110/62 99/72 (!) 100/56  Pulse:  81 79 80  Resp:  (!) 21 18 (!) 21  Temp:      TempSrc:      SpO2: 96% 98% 95% 96%  Weight:      Height:        Intake/Output Summary (Last 24 hours) at 05/15/2022 1617 Last data filed at 05/15/2022 1600 Gross per 24 hour  Intake 3149.69 ml  Output 1120 ml  Net 2029.69 ml    Filed Weights   05/11/22 0709 05/14/22 0500 05/15/22 0402  Weight: (!) 172.5 kg (!) 172.1 kg (!) 174.4 kg     Exam:  General: Morbidly obese female sitting up in ICU bed looking brighter and more comfortable with attentive son at bedside. Eyes: sclera anicteric, conjuctiva mild injection bilaterally CVS: Distant heart sounds Respiratory: Difficult to auscultate due to body habitus GI: Extremely large pannus, difficult to assess, abdomen is soft. LE: S/p L BKA  Data Reviewed:  Basic Metabolic Panel: Recent Labs  Lab 05/11/22 1936 05/12/22 0357 05/12/22 2259 05/13/22 0744 05/14/22 0509 05/14/22 1833 05/15/22 0637  NA 135 138 135 137 138  --  138  K 3.9 3.5 3.4* 3.7 3.1* 3.4* 3.4*  CL 103 105 104 104 100  --  105  CO2 24 26 23 25 23   --  25  GLUCOSE 152* 125* 171* 115* 115*  --  109*  BUN 21 23 20 21 21    --  21  CREATININE 1.11* 0.95 0.87 0.84 0.79  --  0.78  CALCIUM 7.3* 7.3* 7.1* 7.7* 7.7*  --  7.6*  MG 2.1 2.1 1.9 2.4 2.4  --  2.4  PHOS 3.4 3.0  --  2.2* 2.8  --  3.5     CBC: Recent Labs  Lab 05/10/22 0327 05/10/22 2122 05/12/22 0357 05/13/22 0744 05/14/22 0509 05/15/22 0637  WBC 4.4 12.0* 15.3* 10.8* 9.0 10.0  NEUTROABS 3.5  --   --   --   --   --   HGB 15.2* 13.9 13.3 12.6 12.9 12.9  HCT 48.8* 42.2 39.9 38.4 38.1 37.8  MCV 97.0 92.5 92.6 92.8 89.4 87.7  PLT 217 159 171 PLATELET CLUMPS NOTED ON SMEAR, UNABLE TO ESTIMATE PLATELET CLUMPS NOTED ON SMEAR, UNABLE TO ESTIMATE 85*      Scheduled Meds:  vitamin C  500 mg Per Tube BID   Chlorhexidine Gluconate Cloth  6 each Topical Daily   docusate  100 mg Per Tube BID   free water  30 mL Per Tube Q4H   hydrocortisone sod succinate (SOLU-CORTEF) inj  100 mg Intravenous Daily   hydroxychloroquine  400 mg Oral Daily   insulin aspart  0-15 Units Subcutaneous Q4H  levalbuterol  0.63 mg Nebulization TID   And   ipratropium  0.5 mg Nebulization TID   leptospermum manuka honey  1 Application Topical Daily   levothyroxine  137 mcg Per Tube QAC breakfast   liver oil-zinc oxide   Topical TID   metoCLOPramide (REGLAN) injection  10 mg Intravenous Q8H   metoprolol tartrate  25 mg Oral BID   ondansetron (ZOFRAN) IV  4 mg Intravenous Q6H   pantoprazole (PROTONIX) IV  40 mg Intravenous Daily   polyethylene glycol  17 g Per Tube Daily   sodium chloride flush  10-40 mL Intracatheter Q12H   thiamine (VITAMIN B1) injection  100 mg Intravenous Daily   Continuous Infusions:  sodium chloride 10 mL/hr at 05/14/22 1900   sodium chloride 10 mL/hr at 05/15/22 1600   amiodarone 60 mg/hr (05/15/22 1600)   fluconazole (DIFLUCAN) IV Stopped (05/14/22 1748)   heparin 2,600 Units/hr (05/15/22 1600)   linezolid (ZYVOX) IV Stopped (05/15/22 1113)   promethazine (PHENERGAN) injection (IM or IVPB) Stopped (05/13/22 0533)     Assessment & Plan:    Sepsis Thought to be soft tissue infection of her large pannus Patient is somewhat immunocompromised on Plaquenil, MTX being held Patient was treated with broad-spectrum antibiotics, initially with cefepime and vancomycin  She subsequently was changed to then meropenem from 3/26 through 3/28. She was started on linezolid 3/28. She is also on fluconazole for associated Candida infection under her pannus She had been requiring pressor support, now doing well off pressors and even with the addition of metoprolol 25 twice daily her pressures are reasonable. Artline and NG tube are discontinued in anticipation of transfer to [facility], however patient developed RVR and cardiology requested patient to stay in stepdown.  Atrial fibrillation S/p DCCV yesterday however patient is reverted back to AF yesterday Seen by cardiology who have started her on amiodarone drip Patient may need DCCV again if does not convert back with amiodarone Cardiology following closely She remains on heparin drip  Hypokalemia Will replete and recheck  HFpEF Not being treated with any diuresis, thought to be essentially euvolemic although this is difficult to assess given her very large body habitus Patient tolerating metoprolol reasonably well  RA Plaquenil was restarted MTX and gabapentin are being held Patient is on IV hydrocortisone 100 daily  Anticoagulation Patient has history of embolism with occlusion of the Left external iliac artery, CFA and SFA s/p thrombectomy and thrombolysis which unfortunately required subsequent L BKA in 2008. She is on chronic anticoagulation with warfarin at home Presently on heparin drip    DVT prophylaxis: Heparin drip Code Status: Full Family Communication: Son was at bedside throughout    Studies: No results found.  Principal Problem:   Sepsis ([place]) Active Problems:   Fever   AKI (acute kidney injury) ([place])   Hypotension   Pressure injury of skin of sacral  region   Atrial fibrillation with RVR ([place])     Nakeitha Milligan Derek Jack, [facility]  If 7PM-7AM, please contact night-coverage [email or web address]   LOS: 5 days

## 2022-05-16 DIAGNOSIS — I4891 Unspecified atrial fibrillation: Secondary | ICD-10-CM | POA: Diagnosis not present

## 2022-05-16 LAB — BASIC METABOLIC PANEL
Anion gap: 7 (ref 5–15)
BUN: 20 mg/dL (ref 8–23)
CO2: 25 mmol/L (ref 22–32)
Calcium: 7.5 mg/dL — ABNORMAL LOW (ref 8.9–10.3)
Chloride: 103 mmol/L (ref 98–111)
Creatinine, Ser: 0.8 mg/dL (ref 0.44–1.00)
GFR, Estimated: >60 mL/min (ref >60)
Glucose, Bld: 141 mg/dL — ABNORMAL HIGH (ref 70–99)
Potassium: 3.5 mmol/L (ref 3.5–5.1)
Sodium: 135 mmol/L (ref 135–145)

## 2022-05-16 LAB — CBC
HCT: 37.9 % (ref 36.0–46.0)
Hemoglobin: 12.9 g/dL (ref 12.0–15.0)
MCH: 30.2 pg (ref 26.0–34.0)
MCHC: 34 g/dL (ref 30.0–36.0)
MCV: 88.8 fL (ref 80.0–100.0)
Platelets: 96 10*3/uL — ABNORMAL LOW (ref 150–400)
RBC: 4.27 MIL/uL (ref 3.87–5.11)
RDW: 16.8 % — ABNORMAL HIGH (ref 11.5–15.5)
WBC: 11.8 10*3/uL — ABNORMAL HIGH (ref 4.0–10.5)
nRBC: 0.3 % — ABNORMAL HIGH (ref 0.0–0.2)

## 2022-05-16 LAB — GLUCOSE, CAPILLARY
Glucose-Capillary: 158 mg/dL — ABNORMAL HIGH (ref 70–99)
Glucose-Capillary: 154 mg/dL — ABNORMAL HIGH (ref 70–99)
Glucose-Capillary: 158 mg/dL — ABNORMAL HIGH (ref 70–99)
Glucose-Capillary: 189 mg/dL — ABNORMAL HIGH (ref 70–99)

## 2022-05-16 LAB — HEPARIN LEVEL (UNFRACTIONATED)
Heparin Unfractionated: 0.81 IU/mL — ABNORMAL HIGH (ref 0.30–0.70)
Heparin Unfractionated: 0.61 IU/mL (ref 0.30–0.70)
Heparin Unfractionated: 0.84 IU/mL — ABNORMAL HIGH (ref 0.30–0.70)

## 2022-05-16 LAB — MAGNESIUM: Magnesium: 2.3 mg/dL (ref 1.7–2.4)

## 2022-05-16 LAB — PHOSPHORUS: Phosphorus: 3.8 mg/dL (ref 2.5–4.6)

## 2022-05-16 MED ORDER — ENSURE ENLIVE PO LIQD
237.0000 mL | Freq: Three times a day (TID) | ORAL | Status: DC
  Administered 2022-05-16 – 2022-05-19 (×8): 237 mL via ORAL

## 2022-05-16 MED ORDER — ORAL CARE MOUTH RINSE: 15.0000 mL | OROMUCOSAL | Status: DC | PRN

## 2022-05-16 MED ORDER — ADULT MULTIVITAMIN W/MINERALS CH
1.0000 | ORAL_TABLET | Freq: Every day | ORAL | Status: DC
  Administered 2022-05-17 – 2022-05-19 (×3): 1 via ORAL
  Filled 2022-05-16 (×3): qty 1

## 2022-05-16 NOTE — Progress Notes (Signed)
Progress Note  Patient Name: Kayla Reyes Date of Encounter: 05/16/2022  Primary Cardiologist: Fletcher Anon  Subjective   Maintaining sinus rhythm since ~ 1000 on 3/31. No chest pain, dyspnea, palpitations, or dizziness.   Inpatient Medications    Scheduled Meds:  vitamin C  500 mg Per Tube BID   Chlorhexidine Gluconate Cloth  6 each Topical Daily   docusate  100 mg Per Tube BID   hydrocortisone sod succinate (SOLU-CORTEF) inj  100 mg Intravenous Daily   hydroxychloroquine  400 mg Oral Daily   insulin aspart  0-15 Units Subcutaneous TID WC   levalbuterol  0.63 mg Nebulization TID   And   ipratropium  0.5 mg Nebulization TID   leptospermum manuka honey  1 Application Topical Daily   levothyroxine  137 mcg Per Tube QAC breakfast   liver oil-zinc oxide   Topical TID   metoCLOPramide (REGLAN) injection  10 mg Intravenous Q8H   metoprolol tartrate  25 mg Oral BID   ondansetron (ZOFRAN) IV  4 mg Intravenous Q6H   pantoprazole (PROTONIX) IV  40 mg Intravenous Daily   polyethylene glycol  17 g Per Tube Daily   sodium chloride flush  10-40 mL Intracatheter Q12H   thiamine (VITAMIN B1) injection  100 mg Intravenous Daily   Continuous Infusions:  sodium chloride 10 mL/hr at 05/14/22 1900   sodium chloride 20 mL/hr at 05/16/22 0700   amiodarone 60 mg/hr (05/16/22 0700)   fluconazole (DIFLUCAN) IV Stopped (05/15/22 1905)   heparin 2,200 Units/hr (05/16/22 0700)   promethazine (PHENERGAN) injection (IM or IVPB) Stopped (05/13/22 0533)   PRN Meds: acetaminophen **OR** acetaminophen, promethazine (PHENERGAN) injection (IM or IVPB), sodium chloride flush   Vital Signs    Vitals:   05/16/22 0530 05/16/22 0600 05/16/22 0703 05/16/22 0809  BP:  (!) 97/59 97/60   Pulse: 77 72 80   Resp: 19 18 17    Temp:      TempSrc:      SpO2: 96% 98% 99% 98%  Weight:      Height:        Intake/Output Summary (Last 24 hours) at 05/16/2022 0850 Last data filed at 05/16/2022 0700 Gross per 24 hour   Intake 2764.47 ml  Output 1060 ml  Net 1704.47 ml    Filed Weights   05/11/22 0709 05/14/22 0500 05/15/22 0402  Weight: (!) 172.5 kg (!) 172.1 kg (!) 174.4 kg    Telemetry    Maintaining sinus rhythm since ~ 1000 on 3/31 - Personally Reviewed  ECG    No new tracings - Personally Reviewed  Physical Exam   GEN: No acute distress.   Neck: No JVD. Cardiac: RRR, no murmurs, rubs, or gallops.  Respiratory: Diminished breath sounds bilaterally.  GI: Soft, nontender, non-distended.   MS: No edema. Neuro:  Alert and oriented x 3; Nonfocal.  Psych: Normal affect.  Labs    Chemistry Recent Labs  Lab 05/10/22 0327 05/10/22 1437 05/10/22 2122 05/10/22 2146 05/14/22 0509 05/14/22 1833 05/15/22 0637 05/16/22 0247  NA 138 133* 137   < > 138  --  138 135  K 3.2* 3.7 3.6   < > 3.1* 3.4* 3.4* 3.5  CL 108 103 105   < > 100  --  105 103  CO2 19* 15* 21*   < > 23  --  25 25  GLUCOSE 159* 174* 167*   < > 115*  --  109* 141*  BUN 18 23 24*   < >  21  --  21 20  CREATININE 1.94* 1.71* 1.32*   < > 0.79  --  0.78 0.80  CALCIUM 8.1* 7.8* 7.9*   < > 7.7*  --  7.6* 7.5*  PROT 6.3*  --  5.7*  --   --   --   --   --   ALBUMIN 3.0* 2.6* 2.7*  --   --   --   --   --   AST 49*  --  73*  --   --   --   --   --   ALT 19  --  27  --   --   --   --   --   ALKPHOS 29*  --  29*  --   --   --   --   --   BILITOT 0.8  --  0.8  --   --   --   --   --   GFRNONAA 28* 32* 44*   < > >60  --  >60 >60  ANIONGAP 11 15 11    < > 15  --  8 7   < > = values in this interval not displayed.      Hematology Recent Labs  Lab 05/14/22 0509 05/15/22 0637 05/16/22 0247  WBC 9.0 10.0 11.8*  RBC 4.26 4.31 4.27  HGB 12.9 12.9 12.9  HCT 38.1 37.8 37.9  MCV 89.4 87.7 88.8  MCH 30.3 29.9 30.2  MCHC 33.9 34.1 34.0  RDW 16.7* 16.7* 16.8*  PLT PLATELET CLUMPS NOTED ON SMEAR, UNABLE TO ESTIMATE 85* 96*     Cardiac EnzymesNo results for input(s): "TROPONINI" in the last 168 hours. No results for input(s):  "TROPIPOC" in the last 168 hours.   BNP Recent Labs  Lab 05/10/22 0327  BNP 103.2*      DDimer No results for input(s): "DDIMER" in the last 168 hours.   Radiology    No results found.  Cardiac Studies   2D echo 05/10/2022: 1. Left ventricular ejection fraction, by estimation, is 55 to 60%. The  left ventricle has normal function. The left ventricle has no regional  wall motion abnormalities. Left ventricular diastolic parameters were  normal.   2. Right ventricular systolic function is normal. The right ventricular  size is normal. There is normal pulmonary artery systolic pressure.   3. The mitral valve is normal in structure. No evidence of mitral valve  regurgitation. No evidence of mitral stenosis.   4. The aortic valve is normal in structure. Aortic valve regurgitation is  not visualized. Aortic valve sclerosis is present, with no evidence of  aortic valve stenosis.   Patient Profile     72 y.o. 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 Mortimer Fries.   Assessment & Plan    1. PAF with RVR: -Has previously spontaneously converted on 3/26, though redeveloped Afib at 8:45 on 3/27, which persisted  -Status post successful DCCV on 3/29 -Maintained sinus rhythm until the afternoon of 3/30 at which time she redeveloped A-fib with RVR -Following  IV amiodarone bolus and increased amiodarone drip rate she spontaneously converted to sinus rhythm in the evening of 3/30  with redevelopment of A-fib with RVR on the morning of 3/31  with brief spontaneous conversion to sinus rhythm followed by redevelopment of Afib with RVR, with spontaneous conversion to sinus rhythm around 1000 on 3/31 -Maintaining sinus rhythm  -Continue IV amiodarone at 60 mg/hr -Would benefit from EP evaluation/input - Septic shock previously requiring vasopressor support precluded addition of beta blocker or calcium channel blocker, though now on Lopressor with stable BP off vasopressor support  -CHADS2VASc 5 -PTA warfarin on hold -Heparin gtt (monitor PLT count)   2. Severe sepsis with shock: -Improving -Due to infected infected panus -No longer requiring vasopressor support  -ABX per CCM   3. AKI: -Resolved   4. HFpEF: -Well compensated -Diurese as indicated  -Escalate GDMT as able throughout admission and in follow up   5. Hypokalemia: -Replete per ICU protocol   6. History of arterial thrombus: -Heparin gtt for now -Resume PTA warfarin prior to discharge     For questions or updates, please contact [place] Please consult [email or web address] for contact info under Cardiology/STEMI.    Signed, Christell Faith, PA-C [place] Pager: [phone] 05/16/2022, 8:50 AM

## 2022-05-16 NOTE — Progress Notes (Signed)
ANTICOAGULATION CONSULT NOTE -   Pharmacy Consult for Heparin Indication:  arterial thrombosis , Afib  No Known Allergies  Patient Measurements: Height: 5\' 7"  (170.2 cm) Weight: (!) 174.4 kg (384 lb 7.7 oz) IBW/kg (Calculated) : 61.6 Heparin Dosing Weight: 104 kg   Vital Signs: BP: 112/72 (04/01 1800) Pulse Rate: 76 (04/01 1600)  Labs: Recent Labs    05/14/22 0509 05/14/22 1236 05/15/22 0637 05/15/22 1242 05/16/22 0247 05/16/22 1055 05/16/22 1719  HGB 12.9  --  12.9  --  12.9  --   --   HCT 38.1  --  37.8  --  37.9  --   --   PLT PLATELET CLUMPS NOTED ON SMEAR, UNABLE TO ESTIMATE  --  85*  --  96*  --   --   HEPARINUNFRC 0.28*   < > 0.63   < > 0.81* 0.61 0.84*  CREATININE 0.79  --  0.78  --  0.80  --   --    < > = values in this interval not displayed.     Estimated Creatinine Clearance: 111.8 mL/min (by C-G formula based on SCr of 0.8 mg/dL).   Medical History: Past Medical History:  Diagnosis Date   Arterial thrombosis ([place])    a. s/p thromboectomy and thrombolysis with left BKA in 2008 on chronic Coumadin    HLD (hyperlipidemia)    Hypertension    Hypothyroidism    Obesity    PAF (paroxysmal atrial fibrillation) ([place])    a. noted 12/19 in the setting of hypokalemia; b. CHADS2VASc 4 (HTN, age x 1, vascular disease, female); c. on chronic Coumadin in the setting of prior arterial clot   Rheumatoid arthritis ([place])     Medications:  Medications Prior to Admission  Medication Sig Dispense Refill Last Dose   albuterol (PROVENTIL HFA;VENTOLIN HFA) 108 (90 Base) MCG/ACT inhaler Inhale 2 puffs into the lungs every 6 (six) hours as needed for wheezing or shortness of breath.   prn at unk   fluticasone (FLONASE) 50 MCG/ACT nasal spray Place 1 spray into both nostrils daily as needed for allergies or rhinitis.   prn at unk   furosemide (LASIX) 40 MG tablet Take 40 mg by mouth daily.      gabapentin (NEURONTIN) 600 MG tablet Take 600 mg by mouth 3 (three) times daily.       hydroxychloroquine (PLAQUENIL) 200 MG tablet TAKE 2 TABLETS(400 MG) BY MOUTH EVERY DAY      levothyroxine (SYNTHROID) 150 MCG tablet Take 150 mcg by mouth daily.      lovastatin (MEVACOR) 20 MG tablet Take 20 mg by mouth every evening.      methotrexate (RHEUMATREX) 2.5 MG tablet Take 2.5 mg by mouth once a week.       metoprolol tartrate (LOPRESSOR) 25 MG tablet Take 25 mg by mouth 2 (two) times daily.       potassium chloride (KLOR-CON M) 10 MEQ tablet Take 10 mEq by mouth daily.      predniSONE (DELTASONE) 1 MG tablet Take by mouth.      TRADJENTA 5 MG TABS tablet Take 5 mg by mouth daily.      warfarin (COUMADIN) 10 MG tablet Take 10 mg by mouth daily.      furosemide (LASIX) 20 MG tablet Take 1 tablet (20 mg total) by mouth daily. 30 tablet 5    JARDIANCE 10 MG TABS tablet Take 10 mg by mouth daily.   unk at unk   levothyroxine (SYNTHROID,  LEVOTHROID) 137 MCG tablet Take 137 mcg by mouth daily before breakfast. (Patient not taking: Reported on 05/10/2022)   Not Taking   predniSONE (DELTASONE) 5 MG tablet Take 5 mg by mouth daily with breakfast.  (Patient not taking: Reported on 05/10/2022)   Not Taking    Assessment: Pharmacy consulted to dose heparin in this 72 year old female admitted for sepsis and hx of PAF and arterial thrombosis.   Pt was on warfarin 10 mg PO daily PTA  3/26:  INR @ 0327 = 2.8  Goal of Therapy:  Heparin level 0.3-0.7 units/ml Monitor platelets by anticoagulation protocol: Yes  Date Time: HL: Rate: 3/30 1236 0.33 Therapeutic x 1 at 2400 u/hr 3/30     1833  < 0.1      SUBtherapeutic @ 2400 units/hr - STAT redraw ordered -  3/30     2112   < 0.1     SUBtherapeutic @ 2400 units/hr 3/31     0637    0.63    Therapeutic X 1 @ 2800 units/hr 3/31 1242 0.82 SUPRAtherapeutic, 2800 u/hr >2600 u/hr 3/31 2010 0.89 Supratherapeutic @ 2600 u/hr 4/01     0247   0.81     SUPRAtherapeutic @ 2400 units/hr 4/01 1055 0.61 Therapeutic @2200   units/hr 4/01 1719 0.84 SUPRAtherapeutic, 2200 > 2000 u/hr  Plan:  4/01 1719  HL=0.84 SUPRAtherapeutic, decrease heparin infusion rate to 2000 units/hr Recheck heparin level 6 hours after rate change Daily CBC while on heparin  Chinita Greenland PharmD Clinical Pharmacist 05/16/2022

## 2022-05-16 NOTE — Progress Notes (Signed)
   05/16/22 1500  Spiritual Encounters  Type of Visit Follow up  Care provided to: Pt and family  Referral source Chaplain assessment  Reason for visit Routine spiritual support  OnCall Visit No  Spiritual Framework  Presenting Themes Community and relationships  Community/Connection Family  Patient Stress Factors None identified  Family Stress Factors None identified  Interventions  Spiritual Care Interventions Made Established relationship of care and support;Compassionate presence;Reflective listening  Spiritual Care Plan  Spiritual Care Issues Still Outstanding Chaplain will continue to follow   Spoke with patient and family advise the patient that we are here for her. Patient ask for me to pray for her and I advise the patient that I will be praying for her. Patient son ask who was in charge of [place] paperwork. I advise him that I believe the doctor is the one that fill out [place] paperwork.

## 2022-05-16 NOTE — Progress Notes (Addendum)
ANTICOAGULATION CONSULT NOTE -   Pharmacy Consult for Heparin Indication:  arterial thrombosis , Afib  No Known Allergies  Patient Measurements: Height: 5\' 7"  (170.2 cm) Weight: (!) 174.4 kg (384 lb 7.7 oz) IBW/kg (Calculated) : 61.6 Heparin Dosing Weight: 104 kg   Vital Signs: Temp: 97.9 F (36.6 C) (04/01 0500) Temp Source: Oral (04/01 0500) BP: 101/45 (04/01 0900) Pulse Rate: 81 (04/01 0900)  Labs: Recent Labs    05/14/22 0509 05/14/22 1236 05/15/22 0637 05/15/22 1242 05/15/22 2010 05/16/22 0247 05/16/22 1055  HGB 12.9  --  12.9  --   --  12.9  --   HCT 38.1  --  37.8  --   --  37.9  --   PLT PLATELET CLUMPS NOTED ON SMEAR, UNABLE TO ESTIMATE  --  85*  --   --  96*  --   HEPARINUNFRC 0.28*   < > 0.63   < > 0.89* 0.81* 0.61  CREATININE 0.79  --  0.78  --   --  0.80  --    < > = values in this interval not displayed.     Estimated Creatinine Clearance: 111.8 mL/min (by C-G formula based on SCr of 0.8 mg/dL).   Medical History: Past Medical History:  Diagnosis Date   Arterial thrombosis ([place])    a. s/p thromboectomy and thrombolysis with left BKA in 2008 on chronic Coumadin    HLD (hyperlipidemia)    Hypertension    Hypothyroidism    Obesity    PAF (paroxysmal atrial fibrillation) ([place])    a. noted 12/19 in the setting of hypokalemia; b. CHADS2VASc 4 (HTN, age x 1, vascular disease, female); c. on chronic Coumadin in the setting of prior arterial clot   Rheumatoid arthritis ([place])     Medications:  Medications Prior to Admission  Medication Sig Dispense Refill Last Dose   albuterol (PROVENTIL HFA;VENTOLIN HFA) 108 (90 Base) MCG/ACT inhaler Inhale 2 puffs into the lungs every 6 (six) hours as needed for wheezing or shortness of breath.   prn at unk   fluticasone (FLONASE) 50 MCG/ACT nasal spray Place 1 spray into both nostrils daily as needed for allergies or rhinitis.   prn at unk   furosemide (LASIX) 40 MG tablet Take 40 mg by mouth daily.      gabapentin  (NEURONTIN) 600 MG tablet Take 600 mg by mouth 3 (three) times daily.      hydroxychloroquine (PLAQUENIL) 200 MG tablet TAKE 2 TABLETS(400 MG) BY MOUTH EVERY DAY      levothyroxine (SYNTHROID) 150 MCG tablet Take 150 mcg by mouth daily.      lovastatin (MEVACOR) 20 MG tablet Take 20 mg by mouth every evening.      methotrexate (RHEUMATREX) 2.5 MG tablet Take 2.5 mg by mouth once a week.       metoprolol tartrate (LOPRESSOR) 25 MG tablet Take 25 mg by mouth 2 (two) times daily.       potassium chloride (KLOR-CON M) 10 MEQ tablet Take 10 mEq by mouth daily.      predniSONE (DELTASONE) 1 MG tablet Take by mouth.      TRADJENTA 5 MG TABS tablet Take 5 mg by mouth daily.      warfarin (COUMADIN) 10 MG tablet Take 10 mg by mouth daily.      furosemide (LASIX) 20 MG tablet Take 1 tablet (20 mg total) by mouth daily. 30 tablet 5    JARDIANCE 10 MG TABS tablet Take 10 mg  by mouth daily.   unk at unk   levothyroxine (SYNTHROID, LEVOTHROID) 137 MCG tablet Take 137 mcg by mouth daily before breakfast. (Patient not taking: Reported on 05/10/2022)   Not Taking   predniSONE (DELTASONE) 5 MG tablet Take 5 mg by mouth daily with breakfast.  (Patient not taking: Reported on 05/10/2022)   Not Taking    Assessment: Pharmacy consulted to dose heparin in this 72 year old female admitted for sepsis and hx of PAF and arterial thrombosis.   Pt was on warfarin 10 mg PO daily PTA  3/26:  INR @ 0327 = 2.8  Goal of Therapy:  Heparin level 0.3-0.7 units/ml Monitor platelets by anticoagulation protocol: Yes  Date Time: HL: Rate: 3/30 1236 0.33 Therapeutic x 1 at 2400 u/hr 3/30     1833  < 0.1      SUBtherapeutic @ 2400 units/hr - STAT redraw ordered -  3/30     2112   < 0.1     SUBtherapeutic @ 2400 units/hr 3/31     0637    0.63    Therapeutic X 1 @ 2800 units/hr 3/31 1242 0.82 SUPRAtherapeutic, 2800 u/hr >2600 u/hr 3/31 2010 0.89 Supratherapeutic @ 2600 u/hr 4/01     0247   0.81     SUPRAtherapeutic @ 2400  units/hr 4/01 1055 0.61 Therapeutic @2200  units/hr  Plan: heparin level therapeutic x 1 Continue heparin infusion rate at 2200 units/hr Recheck heparin level 6 hours after previous level Daily CBC while on heparin  Glean Salvo, PharmD, BCPS Clinical Pharmacist  05/16/2022 11:31 AM

## 2022-05-16 NOTE — Progress Notes (Signed)
Nutrition Follow Up Note   DOCUMENTATION CODES:   Morbid obesity  INTERVENTION:   Ensure Enlive po TID, each supplement provides 350 kcal and 20 grams of protein.  MVI po daily   Pt at high refeed risk; recommend monitor potassium, magnesium and phosphorus labs daily until stable  Vitamin C 500mg  po BID   NUTRITION DIAGNOSIS:   Inadequate oral intake related to acute illness as evidenced by NPO status.  GOAL:   Patient will meet greater than or equal to 90% of their needs -not met   MONITOR:   PO intake, Supplement acceptance, Labs, Weight trends, I & O's, Skin  ASSESSMENT:   72 y/o female with h/o HTN, L AKA, hypothyroidism, RA, thrombosis of aorta and renal artery, PAF and CHF who is admitted with sepsis, infected panus, AKI and CHF excerbation.  Pt initiated on a full liquid diet 3/29; pt advanced to carbohydrate modified diet today. Pt with poor appetite and oral intake. Pt eating <25% of meals. RD will add supplements and MVI to help pt meet her estimated needs. Pt is at refeed risk. Per chart, pt is up ~16lbs since admit. Pt +13.6L on her I & Os.   Medications reviewed and include: vitamin C, colace, solu-cortef, insulin, synthroid, reglan, zofran, protonix, miralax, thiamine, diflucan, heparin  Labs reviewed: K 3.5 wnl, P 3.8 wnl, Mg 2.3 wnl Wbc- 11.8(H) Cbgs- 154, 158 x 24 hrs  Diet Order:    Diet Order             Diet full liquid Room service appropriate? Yes with Assist; Fluid consistency: Thin  Diet effective now                  EDUCATION NEEDS:   No education needs have been identified at this time  Skin:  Skin Assessment: Skin Integrity Issues: Stage II sacrum, MASD   Last BM:  4/1- type 6  Height:   Ht Readings from Last 1 Encounters:  05/15/22 5\' 7"  (1.702 m)    Weight:   Wt Readings from Last 1 Encounters:  05/15/22 (!) 174.4 kg    Ideal Body Weight:  61.36 kg  BMI:  Body mass index is 60.22 kg/m.  Estimated Nutritional  Needs:   Kcal:  2700-3000kcal/day  Protein:  >135g/day  Fluid:  1.5-1.7L/day  Koleen Distance MS, RD, LDN Please refer to [facility] for RD and/or RD on-call/weekend/after hours pager

## 2022-05-16 NOTE — Progress Notes (Signed)
ANTICOAGULATION CONSULT NOTE -   Pharmacy Consult for Heparin Indication:  arterial thrombosis , Afib  No Known Allergies  Patient Measurements: Height: 5\' 7"  (170.2 cm) Weight: (!) 174.4 kg (384 lb 7.7 oz) IBW/kg (Calculated) : 61.6 Heparin Dosing Weight: 104 kg   Vital Signs: Temp: 98 F (36.7 C) (03/31 2100) Temp Source: Oral (03/31 2100) BP: 86/51 (04/01 0230) Pulse Rate: 73 (04/01 0230)  Labs: Recent Labs    05/13/22 0849 05/14/22 0509 05/14/22 1236 05/15/22 0637 05/15/22 1242 05/15/22 2010 05/16/22 0247  HGB  --  12.9  --  12.9  --   --  12.9  HCT  --  38.1  --  37.8  --   --  37.9  PLT  --  PLATELET CLUMPS NOTED ON SMEAR, UNABLE TO ESTIMATE  --  85*  --   --  96*  LABPROT 27.7*  --   --   --   --   --   --   INR 2.6*  --   --   --   --   --   --   HEPARINUNFRC  --  0.28*   < > 0.63 0.82* 0.89* 0.81*  CREATININE  --  0.79  --  0.78  --   --  0.80   < > = values in this interval not displayed.     Estimated Creatinine Clearance: 111.8 mL/min (by C-G formula based on SCr of 0.8 mg/dL).   Medical History: Past Medical History:  Diagnosis Date   Arterial thrombosis ([place])    a. s/p thromboectomy and thrombolysis with left BKA in 2008 on chronic Coumadin    HLD (hyperlipidemia)    Hypertension    Hypothyroidism    Obesity    PAF (paroxysmal atrial fibrillation) ([place])    a. noted 12/19 in the setting of hypokalemia; b. CHADS2VASc 4 (HTN, age x 1, vascular disease, female); c. on chronic Coumadin in the setting of prior arterial clot   Rheumatoid arthritis ([place])     Medications:  Medications Prior to Admission  Medication Sig Dispense Refill Last Dose   albuterol (PROVENTIL HFA;VENTOLIN HFA) 108 (90 Base) MCG/ACT inhaler Inhale 2 puffs into the lungs every 6 (six) hours as needed for wheezing or shortness of breath.   prn at unk   fluticasone (FLONASE) 50 MCG/ACT nasal spray Place 1 spray into both nostrils daily as needed for allergies or rhinitis.   prn  at unk   furosemide (LASIX) 40 MG tablet Take 40 mg by mouth daily.      gabapentin (NEURONTIN) 600 MG tablet Take 600 mg by mouth 3 (three) times daily.      hydroxychloroquine (PLAQUENIL) 200 MG tablet TAKE 2 TABLETS(400 MG) BY MOUTH EVERY DAY      levothyroxine (SYNTHROID) 150 MCG tablet Take 150 mcg by mouth daily.      lovastatin (MEVACOR) 20 MG tablet Take 20 mg by mouth every evening.      methotrexate (RHEUMATREX) 2.5 MG tablet Take 2.5 mg by mouth once a week.       metoprolol tartrate (LOPRESSOR) 25 MG tablet Take 25 mg by mouth 2 (two) times daily.       potassium chloride (KLOR-CON M) 10 MEQ tablet Take 10 mEq by mouth daily.      predniSONE (DELTASONE) 1 MG tablet Take by mouth.      TRADJENTA 5 MG TABS tablet Take 5 mg by mouth daily.      warfarin (COUMADIN) 10  MG tablet Take 10 mg by mouth daily.      furosemide (LASIX) 20 MG tablet Take 1 tablet (20 mg total) by mouth daily. 30 tablet 5    JARDIANCE 10 MG TABS tablet Take 10 mg by mouth daily.   unk at unk   levothyroxine (SYNTHROID, LEVOTHROID) 137 MCG tablet Take 137 mcg by mouth daily before breakfast. (Patient not taking: Reported on 05/10/2022)   Not Taking   predniSONE (DELTASONE) 5 MG tablet Take 5 mg by mouth daily with breakfast.  (Patient not taking: Reported on 05/10/2022)   Not Taking    Assessment: Pharmacy consulted to dose heparin in this 72 year old female admitted for sepsis and hx of PAF and arterial thrombosis.   Pt was on warfarin 10 mg PO daily PTA  3/26:  INR @ 0327 = 2.8  Goal of Therapy:  Heparin level 0.3-0.7 units/ml Monitor platelets by anticoagulation protocol: Yes  Date Time: HL: Rate: 3/30 1236 0.33 Therapeutic x 1 at 2400 u/hr 3/30     1833  < 0.1      SUBtherapeutic @ 2400 units/hr - STAT redraw ordered -  3/30     2112   < 0.1     SUBtherapeutic @ 2400 units/hr 3/31     0637    0.63    Therapeutic X 1 @ 2800 units/hr 3/31 1242 0.82 SUPRAtherapeutic, 2800 u/hr >2600  u/hr 3/31 2010 0.89 Supratherapeutic @ 2600 u/hr 4/01     0247   0.81     SUPRAtherapeutic @ 2400 units/hr  Plan:  Decrease heparin infusion rate to 2200 units/hr Recheck heparin level 6 hours after rate is changed Daily CBC while on heparin  Kayla Reyes D, PharmD Clinical Pharmacist 05/16/2022 3:29 AM

## 2022-05-16 NOTE — Consult Note (Signed)
PHARMACY CONSULT NOTE - FOLLOW UP  Pharmacy Consult for Electrolyte Monitoring and Replacement   Recent Labs: Potassium (mmol/L)  Date Value  05/16/2022 3.5   Magnesium (mg/dL)  Date Value  05/16/2022 2.3   Calcium (mg/dL)  Date Value  05/16/2022 7.5 (L)   Albumin (g/dL)  Date Value  05/10/2022 2.7 (L)  02/19/2018 3.1 (L)   Phosphorus (mg/dL)  Date Value  05/16/2022 3.8   Sodium (mmol/L)  Date Value  05/16/2022 135  02/19/2018 137   Calcium: Ca 7.7  alb 2.7 (3/26) Corrected Ca=8.6  Assessment: 72 yo F presenting to [facility] ED from home via EMS on 05/10/22 for evaluation of altered mental status. Family and patient report that she was in her normal state of health until late in the evening on 05/09/22 when she began having significant chills. Then she became less responsive prompting family to call EMS. Pharmacy has been consulted to monitor and replace electrolytes while under PCCM care. -s/p cardioversion 3/29  Goal of Therapy:  Electrolytes WNL  Plan:  --No replacement warranted today --Recheck electrolytes with AM labs   Glean Salvo, PharmD, BCPS Clinical Pharmacist  05/16/2022 7:44 AM

## 2022-05-16 NOTE — Progress Notes (Signed)
Bariatric specialty bed ordered, representative gave ETA of late this evening vs in the morning.

## 2022-05-16 NOTE — Progress Notes (Signed)
PROGRESS NOTE    Kayla Reyes  [id number]  DOB: 1951-01-31  DOA: 05/10/2022 PCP: Marguerita Merles, MD Outpatient Specialists:   Hospital course:  72 year old female was admitted with sepsis.  Workup was unrevealing except for infection under patient's large pannus.  She has been treated with broad-spectrum antibiotics is now presently on linezolid and fluconazole.  She has been weaned off of pressors.   Patient is also had atrial fibrillation with RVR.  She underwent DCCV 3/29 but now has reverted back to A-fib today.  Subjective:  Patient seen with son at bedside.  They both note that patient is feeling much better.  We had a long discussion about concepts of being immunocompromised and importance of treating if infections expeditiously.  Patient apparently had been feeling chilly and sweaty for a while before she came to the ED because she had been told that RA flares could cause these symptoms.  We also talked about pressure ulcers and need to offload pressure if they are going to heal.  Objective: Vitals:   05/16/22 0900 05/16/22 1200 05/16/22 1300 05/16/22 1339  BP: (!) 101/45 (!) 97/59 (!) 94/59   Pulse: 81 85 79   Resp: 18 (!) 21 17   Temp:      TempSrc:      SpO2: 97% 98% 98% 98%  Weight:      Height:        Intake/Output Summary (Last 24 hours) at 05/16/2022 1625 Last data filed at 05/16/2022 1446 Gross per 24 hour  Intake 1998.09 ml  Output 1035 ml  Net 963.09 ml    Filed Weights   05/11/22 0709 05/14/22 0500 05/15/22 0402  Weight: (!) 172.5 kg (!) 172.1 kg (!) 174.4 kg     Exam:  General: Morbidly obese female sitting up in ICU bed in good spirits with attentive son at bedside. Eyes: sclera anicteric, conjuctiva mild injection bilaterally CVS: Distant heart sounds Respiratory: Difficult to auscultate due to body habitus GI: Extremely large pannus, difficult to assess, abdomen is soft. LE: S/p L BKA  Data Reviewed:  Basic Metabolic Panel: Recent  Labs  Lab 05/12/22 0357 05/12/22 2259 05/13/22 0744 05/14/22 0509 05/14/22 1833 05/15/22 0637 05/16/22 0247  NA 138 135 137 138  --  138 135  K 3.5 3.4* 3.7 3.1* 3.4* 3.4* 3.5  CL 105 104 104 100  --  105 103  CO2 26 23 25 23   --  25 25  GLUCOSE 125* 171* 115* 115*  --  109* 141*  BUN 23 20 21 21   --  21 20  CREATININE 0.95 0.87 0.84 0.79  --  0.78 0.80  CALCIUM 7.3* 7.1* 7.7* 7.7*  --  7.6* 7.5*  MG 2.1 1.9 2.4 2.4  --  2.4 2.3  PHOS 3.0  --  2.2* 2.8  --  3.5 3.8     CBC: Recent Labs  Lab 05/10/22 0327 05/10/22 2122 05/12/22 0357 05/13/22 0744 05/14/22 0509 05/15/22 0637 05/16/22 0247  WBC 4.4   < > 15.3* 10.8* 9.0 10.0 11.8*  NEUTROABS 3.5  --   --   --   --   --   --   HGB 15.2*   < > 13.3 12.6 12.9 12.9 12.9  HCT 48.8*   < > 39.9 38.4 38.1 37.8 37.9  MCV 97.0   < > 92.6 92.8 89.4 87.7 88.8  PLT 217   < > 171 PLATELET CLUMPS NOTED ON SMEAR, UNABLE TO ESTIMATE PLATELET CLUMPS  NOTED ON SMEAR, UNABLE TO ESTIMATE 85* 96*   < > = values in this interval not displayed.      Scheduled Meds:  vitamin C  500 mg Per Tube BID   Chlorhexidine Gluconate Cloth  6 each Topical Daily   docusate  100 mg Per Tube BID   feeding supplement  237 mL Oral TID BM   hydrocortisone sod succinate (SOLU-CORTEF) inj  100 mg Intravenous Daily   hydroxychloroquine  400 mg Oral Daily   insulin aspart  0-15 Units Subcutaneous TID WC   levalbuterol  0.63 mg Nebulization TID   And   ipratropium  0.5 mg Nebulization TID   leptospermum manuka honey  1 Application Topical Daily   levothyroxine  137 mcg Per Tube QAC breakfast   liver oil-zinc oxide   Topical TID   metoCLOPramide (REGLAN) injection  10 mg Intravenous Q8H   metoprolol tartrate  25 mg Oral BID   [START ON 05/17/2022] multivitamin with minerals  1 tablet Oral Daily   ondansetron (ZOFRAN) IV  4 mg Intravenous Q6H   pantoprazole (PROTONIX) IV  40 mg Intravenous Daily   polyethylene glycol  17 g Per Tube Daily   sodium chloride  flush  10-40 mL Intracatheter Q12H   thiamine (VITAMIN B1) injection  100 mg Intravenous Daily   Continuous Infusions:  sodium chloride 10 mL/hr at 05/14/22 1900   sodium chloride 5 mL/hr at 05/16/22 1446   amiodarone 60 mg/hr (05/16/22 1446)   fluconazole (DIFLUCAN) IV Stopped (05/15/22 1905)   heparin 2,200 Units/hr (05/16/22 1446)   promethazine (PHENERGAN) injection (IM or IVPB) Stopped (05/13/22 0533)     Assessment & Plan:   Sepsis Thought to be soft tissue infection of her large pannus Patient is somewhat immunocompromised on Plaquenil, MTX being held Patient was treated with broad-spectrum antibiotics, initially with cefepime and vancomycin  She subsequently was changed to then meropenem from 3/26 through 3/28. She was started on linezolid 3/28. She is also on fluconazole for associated Candida infection under her pannus She had been requiring pressor support, now doing well off pressors and even with the addition of metoprolol 25 twice daily her pressures are reasonable. Artline and NG tube are discontinued in anticipation of transfer to [facility]  Atrial fibrillation S/p DCCV 3/29  however patient is reverted back to AF and then spontaneously converted back to NSR on IV amiodarone. Followed by cardiology who are recommending amiodarone drip for 1 more day and then switch to oral amiodarone if she remains in NSR. Continue metoprolol 25 twice daily. She remains on heparin drip, can be transitioned over to warfarin prior to discharge.  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 She is on chronic anticoagulation with warfarin at home Presently on heparin drip and will need to be transition to warfarin prior to discharge.    DVT prophylaxis: Heparin drip Code Status: Full Family Communication: Son was at bedside throughout    Studies: No results found.  Principal Problem:   Sepsis Active Problems:   Fever   AKI (acute kidney injury)   Hypotension  Pressure injury of skin of sacral region   Atrial fibrillation with RVR     Anjani Feuerborn Derek Jack, [facility]  If 7PM-7AM, please contact night-coverage [email or web address]   LOS: 6 days

## 2022-05-17 DIAGNOSIS — N179 Acute kidney failure, unspecified: Secondary | ICD-10-CM | POA: Diagnosis not present

## 2022-05-17 DIAGNOSIS — A419 Sepsis, unspecified organism: Secondary | ICD-10-CM | POA: Diagnosis not present

## 2022-05-17 DIAGNOSIS — I4891 Unspecified atrial fibrillation: Secondary | ICD-10-CM | POA: Diagnosis not present

## 2022-05-17 DIAGNOSIS — L89159 Pressure ulcer of sacral region, unspecified stage: Secondary | ICD-10-CM | POA: Diagnosis not present

## 2022-05-17 LAB — CBC
HCT: 37.2 % (ref 36.0–46.0)
Hemoglobin: 12.4 g/dL (ref 12.0–15.0)
MCH: 29.5 pg (ref 26.0–34.0)
MCHC: 33.3 g/dL (ref 30.0–36.0)
MCV: 88.6 fL (ref 80.0–100.0)
Platelets: 144 10*3/uL — ABNORMAL LOW (ref 150–400)
RBC: 4.2 MIL/uL (ref 3.87–5.11)
RDW: 17.1 % — ABNORMAL HIGH (ref 11.5–15.5)
WBC: 11.3 10*3/uL — ABNORMAL HIGH (ref 4.0–10.5)
nRBC: 0 % (ref 0.0–0.2)

## 2022-05-17 LAB — BASIC METABOLIC PANEL
Anion gap: 8 (ref 5–15)
BUN: 20 mg/dL (ref 8–23)
CO2: 25 mmol/L (ref 22–32)
Calcium: 7.7 mg/dL — ABNORMAL LOW (ref 8.9–10.3)
Chloride: 101 mmol/L (ref 98–111)
Creatinine, Ser: 0.82 mg/dL (ref 0.44–1.00)
GFR, Estimated: >60 mL/min (ref >60)
Glucose, Bld: 179 mg/dL — ABNORMAL HIGH (ref 70–99)
Potassium: 3.3 mmol/L — ABNORMAL LOW (ref 3.5–5.1)
Sodium: 134 mmol/L — ABNORMAL LOW (ref 135–145)

## 2022-05-17 LAB — HEPARIN LEVEL (UNFRACTIONATED)
Heparin Unfractionated: 0.75 IU/mL — ABNORMAL HIGH (ref 0.30–0.70)
Heparin Unfractionated: 0.68 IU/mL (ref 0.30–0.70)

## 2022-05-17 LAB — PROTIME-INR
INR: 2.9 — ABNORMAL HIGH (ref 0.8–1.2)
INR: 2.7 — ABNORMAL HIGH (ref 0.8–1.2)
Prothrombin Time: 29.9 seconds — ABNORMAL HIGH (ref 11.4–15.2)
Prothrombin Time: 28.4 seconds — ABNORMAL HIGH (ref 11.4–15.2)

## 2022-05-17 LAB — GLUCOSE, CAPILLARY
Glucose-Capillary: 121 mg/dL — ABNORMAL HIGH (ref 70–99)
Glucose-Capillary: 159 mg/dL — ABNORMAL HIGH (ref 70–99)
Glucose-Capillary: 179 mg/dL — ABNORMAL HIGH (ref 70–99)
Glucose-Capillary: 170 mg/dL — ABNORMAL HIGH (ref 70–99)

## 2022-05-17 LAB — PHOSPHORUS: Phosphorus: 3.7 mg/dL (ref 2.5–4.6)

## 2022-05-17 LAB — MAGNESIUM: Magnesium: 2.3 mg/dL (ref 1.7–2.4)

## 2022-05-17 MED ORDER — AMIODARONE HCL 200 MG PO TABS
400.0000 mg | ORAL_TABLET | Freq: Two times a day (BID) | ORAL | Status: DC
  Administered 2022-05-17 – 2022-05-19 (×5): 400 mg via ORAL
  Filled 2022-05-17 (×5): qty 2

## 2022-05-17 MED ORDER — DOCUSATE SODIUM 100 MG PO CAPS
100.0000 mg | ORAL_CAPSULE | Freq: Two times a day (BID) | ORAL | Status: DC
  Administered 2022-05-17 – 2022-05-19 (×4): 100 mg via ORAL
  Filled 2022-05-17 (×4): qty 1

## 2022-05-17 MED ORDER — AMIODARONE HCL 200 MG PO TABS: 200.0000 mg | ORAL_TABLET | Freq: Every day | ORAL | Status: DC

## 2022-05-17 MED ORDER — PREDNISONE 10 MG PO TABS: 10.0000 mg | ORAL_TABLET | Freq: Every day | ORAL | Status: DC

## 2022-05-17 MED ORDER — PANTOPRAZOLE SODIUM 40 MG PO TBEC
40.0000 mg | DELAYED_RELEASE_TABLET | Freq: Every day | ORAL | Status: DC
  Administered 2022-05-18 – 2022-05-19 (×2): 40 mg via ORAL
  Filled 2022-05-17 (×2): qty 1

## 2022-05-17 MED ORDER — VITAMIN C 500 MG PO TABS
500.0000 mg | ORAL_TABLET | Freq: Two times a day (BID) | ORAL | Status: DC
  Administered 2022-05-17 – 2022-05-19 (×4): 500 mg via ORAL
  Filled 2022-05-17 (×4): qty 1

## 2022-05-17 MED ORDER — ONDANSETRON HCL 4 MG PO TABS: 4.0000 mg | ORAL_TABLET | Freq: Four times a day (QID) | ORAL | Status: DC | PRN

## 2022-05-17 MED ORDER — LEVALBUTEROL HCL 0.63 MG/3ML IN NEBU
0.6300 mg | INHALATION_SOLUTION | Freq: Four times a day (QID) | RESPIRATORY_TRACT | Status: DC | PRN
  Administered 2022-05-19: 0.63 mg via RESPIRATORY_TRACT
  Filled 2022-05-17: qty 3

## 2022-05-17 MED ORDER — LEVOTHYROXINE SODIUM 137 MCG PO TABS
137.0000 ug | ORAL_TABLET | Freq: Every day | ORAL | Status: DC
  Administered 2022-05-18 – 2022-05-19 (×2): 137 ug via ORAL
  Filled 2022-05-17 (×2): qty 1

## 2022-05-17 MED ORDER — HYDROCORTISONE SOD SUC (PF) 100 MG IJ SOLR: 50.0000 mg | Freq: Every day | INTRAMUSCULAR | Status: DC

## 2022-05-17 MED ORDER — FLUCONAZOLE 100 MG PO TABS
200.0000 mg | ORAL_TABLET | Freq: Every day | ORAL | Status: DC
  Administered 2022-05-17 – 2022-05-19 (×3): 200 mg via ORAL
  Filled 2022-05-17 (×3): qty 2

## 2022-05-17 MED ORDER — AMIODARONE HCL 200 MG PO TABS: 200.0000 mg | ORAL_TABLET | Freq: Two times a day (BID) | ORAL | Status: DC

## 2022-05-17 MED ORDER — THIAMINE MONONITRATE 100 MG PO TABS
100.0000 mg | ORAL_TABLET | Freq: Every day | ORAL | Status: DC
  Administered 2022-05-18 – 2022-05-19 (×2): 100 mg via ORAL
  Filled 2022-05-17 (×2): qty 1

## 2022-05-17 MED ORDER — PREDNISONE 20 MG PO TABS: 20.0000 mg | ORAL_TABLET | Freq: Every day | ORAL | Status: DC

## 2022-05-17 MED ORDER — IPRATROPIUM BROMIDE 0.02 % IN SOLN
0.5000 mg | Freq: Four times a day (QID) | RESPIRATORY_TRACT | Status: DC | PRN
  Administered 2022-05-19: 0.5 mg via RESPIRATORY_TRACT
  Filled 2022-05-17: qty 2.5

## 2022-05-17 MED ORDER — WARFARIN - PHARMACIST DOSING INPATIENT: Freq: Every day | Status: DC

## 2022-05-17 MED ORDER — POLYETHYLENE GLYCOL 3350 17 G PO PACK
17.0000 g | PACK | Freq: Every day | ORAL | Status: DC
  Administered 2022-05-18 – 2022-05-19 (×2): 17 g via ORAL
  Filled 2022-05-17 (×2): qty 1

## 2022-05-17 NOTE — Progress Notes (Signed)
ANTICOAGULATION CONSULT NOTE -   Pharmacy Consult for Heparin Indication:  arterial thrombosis , Afib  No Known Allergies  Patient Measurements: Height: 5\' 7"  (170.2 cm) Weight: (!) 174.4 kg (384 lb 7.7 oz) IBW/kg (Calculated) : 61.6 Heparin Dosing Weight: 104 kg   Vital Signs: Temp: 98.6 F (37 C) (04/02 0006) Temp Source: Oral (04/02 0006) BP: 96/74 (04/02 0006) Pulse Rate: 78 (04/02 0006)  Labs: Recent Labs    05/15/22 0637 05/15/22 1242 05/16/22 0247 05/16/22 1055 05/16/22 1719 05/17/22 0210  HGB 12.9  --  12.9  --   --  12.4  HCT 37.8  --  37.9  --   --  37.2  PLT 85*  --  96*  --   --  144*  HEPARINUNFRC 0.63   < > 0.81* 0.61 0.84* 0.75*  CREATININE 0.78  --  0.80  --   --  0.82   < > = values in this interval not displayed.     Estimated Creatinine Clearance: 109.1 mL/min (by C-G formula based on SCr of 0.82 mg/dL).   Medical History: Past Medical History:  Diagnosis Date   Arterial thrombosis ([place])    a. s/p thromboectomy and thrombolysis with left BKA in 2008 on chronic Coumadin    HLD (hyperlipidemia)    Hypertension    Hypothyroidism    Obesity    PAF (paroxysmal atrial fibrillation) ([place])    a. noted 12/19 in the setting of hypokalemia; b. CHADS2VASc 4 (HTN, age x 1, vascular disease, female); c. on chronic Coumadin in the setting of prior arterial clot   Rheumatoid arthritis ([place])     Medications:  Medications Prior to Admission  Medication Sig Dispense Refill Last Dose   albuterol (PROVENTIL HFA;VENTOLIN HFA) 108 (90 Base) MCG/ACT inhaler Inhale 2 puffs into the lungs every 6 (six) hours as needed for wheezing or shortness of breath.   prn at unk   fluticasone (FLONASE) 50 MCG/ACT nasal spray Place 1 spray into both nostrils daily as needed for allergies or rhinitis.   prn at unk   furosemide (LASIX) 40 MG tablet Take 40 mg by mouth daily.      gabapentin (NEURONTIN) 600 MG tablet Take 600 mg by mouth 3 (three) times daily.       hydroxychloroquine (PLAQUENIL) 200 MG tablet TAKE 2 TABLETS(400 MG) BY MOUTH EVERY DAY      levothyroxine (SYNTHROID) 150 MCG tablet Take 150 mcg by mouth daily.      lovastatin (MEVACOR) 20 MG tablet Take 20 mg by mouth every evening.      methotrexate (RHEUMATREX) 2.5 MG tablet Take 2.5 mg by mouth once a week.       metoprolol tartrate (LOPRESSOR) 25 MG tablet Take 25 mg by mouth 2 (two) times daily.       potassium chloride (KLOR-CON M) 10 MEQ tablet Take 10 mEq by mouth daily.      predniSONE (DELTASONE) 1 MG tablet Take by mouth.      TRADJENTA 5 MG TABS tablet Take 5 mg by mouth daily.      warfarin (COUMADIN) 10 MG tablet Take 10 mg by mouth daily.      furosemide (LASIX) 20 MG tablet Take 1 tablet (20 mg total) by mouth daily. 30 tablet 5    JARDIANCE 10 MG TABS tablet Take 10 mg by mouth daily.   unk at unk   levothyroxine (SYNTHROID, LEVOTHROID) 137 MCG tablet Take 137 mcg by mouth daily before breakfast. (Patient  not taking: Reported on 05/10/2022)   Not Taking   predniSONE (DELTASONE) 5 MG tablet Take 5 mg by mouth daily with breakfast.  (Patient not taking: Reported on 05/10/2022)   Not Taking    Assessment: Pharmacy consulted to dose heparin in this 72 year old female admitted for sepsis and hx of PAF and arterial thrombosis.   Pt was on warfarin 10 mg PO daily PTA  3/26:  INR @ 0327 = 2.8  Goal of Therapy:  Heparin level 0.3-0.7 units/ml Monitor platelets by anticoagulation protocol: Yes  Date Time: HL: Rate: 3/30 1236 0.33 Therapeutic x 1 at 2400 u/hr 3/30     1833  < 0.1      SUBtherapeutic @ 2400 units/hr - STAT redraw ordered -  3/30     2112   < 0.1     SUBtherapeutic @ 2400 units/hr 3/31     0637    0.63    Therapeutic X 1 @ 2800 units/hr 3/31 1242 0.82 SUPRAtherapeutic, 2800 u/hr >2600 u/hr 3/31 2010 0.89 Supratherapeutic @ 2600 u/hr 4/01     0247   0.81     SUPRAtherapeutic @ 2400 units/hr 4/01 1055 0.61 Therapeutic @2200   units/hr 4/01 1719 0.84 SUPRAtherapeutic, 2200 > 2000 u/hr 4/02 0210 0.75 Supratherapeutic  Plan:  Decrease heparin infusion rate to 1900 units/hr Recheck heparin level 6 hours after rate change Daily CBC while on heparin  Renda Rolls, PharmD, MBA 05/17/2022 3:00 AM

## 2022-05-17 NOTE — TOC Initial Note (Signed)
Transition of Care [facility]) - Initial/Assessment Note    Patient Details  Name: Jannetta Woulard MRN: [id number] Date of Birth: 1950/05/24  Transition of Care [facility]) CM/SW Contact:    Laurena Slimmer, RN Phone Number: 05/17/2022, 1:09 PM  Clinical Narrative:                  Transition of Care (TOC) Screening Note   Patient Details  Name: Lucie Edgecombe Date of Birth: [birth year]   Transition of Care [facility]) CM/SW Contact:    Laurena Slimmer, RN Phone Number: 05/17/2022, 1:09 PM    Transition of Care Department [facility]) has reviewed patient and no TOC needs have been identified at this time. We will continue to monitor patient advancement through interdisciplinary progression rounds. If new patient transition needs arise, please place a TOC consult.          Patient Goals and CMS Choice            Expected Discharge Plan and Services                                              Prior Living Arrangements/Services                       Activities of Daily Living      Permission Sought/Granted                  Emotional Assessment              Admission diagnosis:  AKI (acute kidney injury) [N17.9] Septic shock [A41.9, R65.21] Fever, unspecified fever cause [R50.9] Hypotension, unspecified hypotension type [I95.9] Pressure injury of skin of sacral region, unspecified injury stage [L89.159] Sepsis, due to unspecified organism, unspecified whether acute organ dysfunction present [A41.9] Patient Active Problem List   Diagnosis Date Noted   Fever 05/12/2022   AKI (acute kidney injury) 05/12/2022   Hypotension 05/12/2022   Pressure injury of skin of sacral region 05/12/2022   Atrial fibrillation with RVR 05/12/2022   Sepsis 05/10/2022   Pulmonary edema 02/08/2018   Cellulitis 02/08/2018   HTN (hypertension) 02/08/2018   Hypothyroidism 02/08/2018   HLD (hyperlipidemia) 02/08/2018   PCP:  Marguerita Merles, MD Pharmacy:   [facility] [id number] [address], [place] AT [facility] OF [address] [place] [address] Phone: [phone] Fax: [phone]     Social Determinants of Health (SDOH) Social History: SDOH Screenings   Tobacco Use: Medium Risk (05/15/2022)   SDOH Interventions:     Readmission Risk Interventions     No data to display

## 2022-05-17 NOTE — Progress Notes (Signed)
[facility] NAME: Julianie Reyes    MR#:  [id number]  DATE OF BIRTH:  [birth year]  SUBJECTIVE:  patient seen earlier. Sitting upright. It good breakfast. No family at bedside. No new complaints. Overall slowly improving. Vitals stable.    VITALS:  Blood pressure (!) 107/50, pulse 75, temperature 97.9 F (36.6 C), resp. rate 20, height 5\' 7"  (1.702 m), weight (!) 174.4 kg, SpO2 (!) 61 %.  PHYSICAL EXAMINATION:   GENERAL:  72 y.o.-year-old patient with no acute distress. Morbidly obese LUNGS: Normal breath sounds bilaterally, no wheezing CARDIOVASCULAR: S1, S2 normal. No murmur   ABDOMEN: Soft, severely obese with severe hyperpigmentation  EXTREMITIES: left BKA+ NEUROLOGIC: nonfocal  patient is alert and awake  LABORATORY PANEL:  CBC Recent Labs  Lab 05/17/22 0210  WBC 11.3*  HGB 12.4  HCT 37.2  PLT 144*    Chemistries  Recent Labs  Lab 05/10/22 2122 05/10/22 2146 05/17/22 0210  NA 137   < > 134*  K 3.6   < > 3.3*  CL 105   < > 101  CO2 21*   < > 25  GLUCOSE 167*   < > 179*  BUN 24*   < > 20  CREATININE 1.32*   < > 0.82  CALCIUM 7.9*   < > 7.7*  MG 2.1   < > 2.3  AST 73*  --   --   ALT 27  --   --   ALKPHOS 29*  --   --   BILITOT 0.8  --   --    < > = values in this interval not displayed.    Assessment and Plan  72 year old female was admitted with sepsis.  Workup was unrevealing except for infection under patient's large pannus.  She has been treated with broad-spectrum antibiotics is now presently on linezolid and fluconazole.  She has been weaned off of pressors.   Patient is also had atrial fibrillation with RVR.  She underwent DCCV 3/29 but now has reverted back to A-fib today.   Septic shock due to infected Pannus --Thought to be soft tissue infection of her large pannus --Patient is somewhat immunocompromised on Plaquenil, MTX being held --Patient was treated with broad-spectrum antibiotics,  initially with cefepime and vancomycin  -She subsequently was changed to then meropenem from 3/26 through 3/28. --She was started on linezolid 3/28--completed course --She is also on fluconazole for associated Candida infection under her pannus 6/10 days --overall progressing well. Regressing well  Atrial fibrillation --S/p DCCV 3/29  however patient is reverted back to AF and then spontaneously converted back to NSR on Ipo amiodarone. --Followed by cardiology Dr Fletcher Anon --Continue metoprolol 25 twice daily. -- Okay to switch to oral anticoagulation. 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 --She is on chronic anticoagulation with warfarin at home --Presently on heparin drip and will  transition to warfarin today.    DVT prophylaxis: coumadin Code Status:  Full Family Communication: spoke with sister Kayla Reyes  Consults :[facility] cardiology Level of care: Progressive Status is: Inpatient Remains inpatient appropriate because: overall improving. PT OT to see patient. Will work was discharge planning and next couple days    TOTAL TIME TAKING CARE OF THIS PATIENT: 35 minutes.  >50% time spent on counselling and coordination of care  Note: This dictation was prepared with Dragon dictation along with smaller phrase technology. Any transcriptional errors that result from this process are unintentional.  Fritzi Mandes M.D    [facility]    CC: Primary care physician; Marguerita Merles, MD

## 2022-05-17 NOTE — Progress Notes (Signed)
ANTICOAGULATION CONSULT NOTE -   Pharmacy Consult for Warfarin Indication:  arterial thrombosis , Afib  No Known Allergies  Patient Measurements: Height: 5\' 7"  (170.2 cm) Weight: (!) 174.4 kg (384 lb 7.7 oz) IBW/kg (Calculated) : 61.6 Heparin Dosing Weight: 104 kg   Vital Signs: Temp: 97.6 F (36.4 C) (04/02 1556) Temp Source: Oral (04/02 0804) BP: 102/60 (04/02 1556) Pulse Rate: 73 (04/02 1556)  Labs: Recent Labs    05/15/22 0637 05/15/22 1242 05/16/22 0247 05/16/22 1055 05/16/22 1719 05/17/22 0210 05/17/22 1042 05/17/22 1905  HGB 12.9  --  12.9  --   --  12.4  --   --   HCT 37.8  --  37.9  --   --  37.2  --   --   PLT 85*  --  96*  --   --  144*  --   --   LABPROT  --   --   --   --   --   --  29.9* 28.4*  INR  --   --   --   --   --   --  2.9* 2.7*  HEPARINUNFRC 0.63   < > 0.81*   < > 0.84* 0.75* 0.68  --   CREATININE 0.78  --  0.80  --   --  0.82  --   --    < > = values in this interval not displayed.     Estimated Creatinine Clearance: 109.1 mL/min (by C-G formula based on SCr of 0.82 mg/dL).   Medical History: Past Medical History:  Diagnosis Date   Arterial thrombosis ([place])    a. s/p thromboectomy and thrombolysis with left BKA in 2008 on chronic Coumadin    HLD (hyperlipidemia)    Hypertension    Hypothyroidism    Obesity    PAF (paroxysmal atrial fibrillation) ([place])    a. noted 12/19 in the setting of hypokalemia; b. CHADS2VASc 4 (HTN, age x 1, vascular disease, female); c. on chronic Coumadin in the setting of prior arterial clot   Rheumatoid arthritis ([place])     Medications:  Medications Prior to Admission  Medication Sig Dispense Refill Last Dose   albuterol (PROVENTIL HFA;VENTOLIN HFA) 108 (90 Base) MCG/ACT inhaler Inhale 2 puffs into the lungs every 6 (six) hours as needed for wheezing or shortness of breath.   prn at unk   fluticasone (FLONASE) 50 MCG/ACT nasal spray Place 1 spray into both nostrils daily as needed for allergies or  rhinitis.   prn at unk   furosemide (LASIX) 40 MG tablet Take 40 mg by mouth daily.      gabapentin (NEURONTIN) 600 MG tablet Take 600 mg by mouth 3 (three) times daily.      hydroxychloroquine (PLAQUENIL) 200 MG tablet TAKE 2 TABLETS(400 MG) BY MOUTH EVERY DAY      levothyroxine (SYNTHROID) 150 MCG tablet Take 150 mcg by mouth daily.      lovastatin (MEVACOR) 20 MG tablet Take 20 mg by mouth every evening.      methotrexate (RHEUMATREX) 2.5 MG tablet Take 2.5 mg by mouth once a week.       metoprolol tartrate (LOPRESSOR) 25 MG tablet Take 25 mg by mouth 2 (two) times daily.       potassium chloride (KLOR-CON M) 10 MEQ tablet Take 10 mEq by mouth daily.      predniSONE (DELTASONE) 1 MG tablet Take by mouth.      TRADJENTA 5 MG TABS tablet Take  5 mg by mouth daily.      warfarin (COUMADIN) 10 MG tablet Take 10 mg by mouth daily.      furosemide (LASIX) 20 MG tablet Take 1 tablet (20 mg total) by mouth daily. 30 tablet 5    JARDIANCE 10 MG TABS tablet Take 10 mg by mouth daily.   unk at unk   levothyroxine (SYNTHROID, LEVOTHROID) 137 MCG tablet Take 137 mcg by mouth daily before breakfast. (Patient not taking: Reported on 05/10/2022)   Not Taking   predniSONE (DELTASONE) 5 MG tablet Take 5 mg by mouth daily with breakfast.  (Patient not taking: Reported on 05/10/2022)   Not Taking    Assessment: 72 y.o. 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 Patient now has afib with RVR CHADSVASc 5. Pharmacy consulted to start warfarin. Warfarin has been held for 7 days and INR is still elevated. New start amio and fluconazole. Plan for 10 days of fluconazole.   Date INR Warfarin Dose  4/2 2.9 HOLD  4/2 @ 1905 2.7  No change- no warfarin or bridging    Goal of Therapy:  INR  2-3 Monitor platelets by anticoagulation protocol: Yes   Plan:  Stopped heparin infusion due INR at the upper limit of goal.  INR remains above 2, no warfarin per MD order and no need for bridge therapy.   Mariaisabel Bodiford Rodriguez-Guzman PharmD, BCPS 05/17/2022 7:51 PM

## 2022-05-17 NOTE — Progress Notes (Signed)
ANTICOAGULATION CONSULT NOTE -   Pharmacy Consult for Warfarin Indication:  arterial thrombosis , Afib  No Known Allergies  Patient Measurements: Height: 5\' 7"  (170.2 cm) Weight: (!) 174.4 kg (384 lb 7.7 oz) IBW/kg (Calculated) : 61.6 Heparin Dosing Weight: 104 kg   Vital Signs: Temp: 98.2 F (36.8 C) (04/02 0804) Temp Source: Oral (04/02 0804) BP: 117/51 (04/02 0804) Pulse Rate: 76 (04/02 0804)  Labs: Recent Labs    05/15/22 0637 05/15/22 1242 05/16/22 0247 05/16/22 1055 05/16/22 1719 05/17/22 0210 05/17/22 1042  HGB 12.9  --  12.9  --   --  12.4  --   HCT 37.8  --  37.9  --   --  37.2  --   PLT 85*  --  96*  --   --  144*  --   LABPROT  --   --   --   --   --   --  29.9*  INR  --   --   --   --   --   --  2.9*  HEPARINUNFRC 0.63   < > 0.81*   < > 0.84* 0.75* 0.68  CREATININE 0.78  --  0.80  --   --  0.82  --    < > = values in this interval not displayed.     Estimated Creatinine Clearance: 109.1 mL/min (by C-G formula based on SCr of 0.82 mg/dL).   Medical History: Past Medical History:  Diagnosis Date   Arterial thrombosis ([place])    a. 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 noted 12/19 in the setting of hypokalemia; b. CHADS2VASc 4 (HTN, age x 1, vascular disease, female); c. on chronic Coumadin in the setting of prior arterial clot   Rheumatoid arthritis ([place])     Medications:  Medications Prior to Admission  Medication Sig Dispense Refill Last Dose   albuterol (PROVENTIL HFA;VENTOLIN HFA) 108 (90 Base) MCG/ACT inhaler Inhale 2 puffs into the lungs every 6 (six) hours as needed for wheezing or shortness of breath.   prn at unk   fluticasone (FLONASE) 50 MCG/ACT nasal spray Place 1 spray into both nostrils daily as needed for allergies or rhinitis.   prn at unk   furosemide (LASIX) 40 MG tablet  Take 40 mg by mouth daily.      gabapentin (NEURONTIN) 600 MG tablet Take 600 mg by mouth 3 (three) times daily.      hydroxychloroquine (PLAQUENIL) 200 MG tablet TAKE 2 TABLETS(400 MG) BY MOUTH EVERY DAY      levothyroxine (SYNTHROID) 150 MCG tablet Take 150 mcg by mouth daily.      lovastatin (MEVACOR) 20 MG tablet Take 20 mg by mouth every evening.      methotrexate (RHEUMATREX) 2.5 MG tablet Take 2.5 mg by mouth once a week.       metoprolol tartrate (LOPRESSOR) 25 MG tablet Take 25 mg by mouth 2 (two) times daily.       potassium chloride (KLOR-CON M) 10 MEQ tablet Take 10 mEq by mouth daily.      predniSONE (DELTASONE) 1 MG tablet Take by mouth.      TRADJENTA 5 MG TABS tablet Take 5 mg by mouth daily.      warfarin (COUMADIN) 10 MG tablet Take 10 mg by  mouth daily.      furosemide (LASIX) 20 MG tablet Take 1 tablet (20 mg total) by mouth daily. 30 tablet 5    JARDIANCE 10 MG TABS tablet Take 10 mg by mouth daily.   unk at unk   levothyroxine (SYNTHROID, LEVOTHROID) 137 MCG tablet Take 137 mcg by mouth daily before breakfast. (Patient not taking: Reported on 05/10/2022)   Not Taking   predniSONE (DELTASONE) 5 MG tablet Take 5 mg by mouth daily with breakfast.  (Patient not taking: Reported on 05/10/2022)   Not Taking    Assessment: 72 y.o. 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 Patient now has afib with RVR CHADSVASc 5. Pharmacy consulted to start warfarin. Warfarin has been held for 7 days and INR is still elevated. New start amio and fluconazole. Plan for 10 days of fluconazole.   Date INR Warfarin Dose  4/2 2.9 HOLD          Goal of Therapy:  INR 2-3 Monitor platelets by anticoagulation protocol: Yes   Plan:  Stopped heparin infusion due INR at  the upper limit of goal. Will hold warfarin dose today. Rechecking INR at 1800. If INR is < 2.4, recommend giving warfarin 4 mg (~60% decrease from home dose). If INR < 2, start enoxaparin bridge. Daily INR. CBC daily while on heparin.   Eleonore Chiquito, PharmD, BCPS Clinical Pharmacist  05/17/2022 11:12 AM

## 2022-05-17 NOTE — Evaluation (Signed)
Occupational Therapy Evaluation Patient Details Name: Kayla Reyes MRN: [id number] DOB: 17-Jul-1950 Today's Date: 05/17/2022   History of Present Illness Family and patient report that she was in her normal state of health until late in the evening on 05/09/22 when she began having significant chills. Then she became less responsive prompting family to call EMS. Patient denies pain anywhere, fever, urinary symptoms, dyspnea, chest pain, nausea/ vomiting/ diarrhea. She is audibly wheezing and appears mildly dyspneic but both the patient and the son report this as baseline.   Clinical Impression   Upon entering the room, pt in bed eating lunch. Pt reports much of care being performed from bed but unable to answer some questions and seems generally confused. OT called pt's sister who reports pt lives with sister and niece and they care for her from bed level. Pt endorses sitting up on EOB with assistance and scooting into chair but family reports it has been several months since she has been out of bed and they use a transfer sheet to slide her into chair if needed. Pt is at baseline level of function at this time. OT did discuss equipment needs at home to reduce caregiver burden and secondary to skin concerns at this time. Recommendations for hospital bed, hoyer lift, and hoyer sling at home. OT to complete orders at this times as pt is at functional baseline.      Recommendations for follow up therapy are one component of a multi-disciplinary discharge planning process, led by the attending physician.  Recommendations may be updated based on patient status, additional functional criteria and insurance authorization.   Assistance Recommended at Discharge Frequent or constant Supervision/Assistance  Patient can return home with the following Two people to help with bathing/dressing/bathroom;Two people to help with walking and/or transfers;Assistance with cooking/housework;Assist for transportation;Help  with stairs or ramp for entrance    Functional Status Assessment  Patient has not had a recent decline in their functional status  Equipment Recommendations  Other (comment);Hospital bed (hoyer lift and hoyer sling)       Precautions / Restrictions Precautions Precautions: Fall      Mobility Bed Mobility Overal bed mobility: Needs Assistance Bed Mobility: Rolling Rolling: Max assist         General bed mobility comments: L <>R    Transfers                   General transfer comment: not attempted as pt is dependent transfer with transfer sheet at home. OT assisted pt to slide onto new bed with +4 assistance.          ADL either performed or assessed with clinical judgement   ADL Overall ADL's : At baseline                                       General ADL Comments: Per pt and sister's report she needs assistance from bed level.     Vision Patient Visual Report: No change from baseline              Pertinent Vitals/Pain Pain Assessment Pain Assessment: No/denies pain     Hand Dominance Right   Extremity/Trunk Assessment Upper Extremity Assessment Upper Extremity Assessment: Overall WFL for tasks assessed   Lower Extremity Assessment Lower Extremity Assessment: Defer to PT evaluation       Communication Communication Communication: No difficulties   Cognition Arousal/Alertness: Awake/alert  Behavior During Therapy: WFL for tasks assessed/performed Overall Cognitive Status: Within Functional Limits for tasks assessed                                                  Home Living Family/patient expects to be discharged to:: Private residence Living Arrangements: Other relatives;Other (Comment) (sister and niece) Available Help at Discharge: Family;Available 24 hours/day Type of Home: House Home Access: Ramped entrance     Home Layout: Two level;Able to live on main level with bedroom/bathroom                Home Equipment: Other (comment)   Additional Comments: bed pan and transfer sheet      Prior Functioning/Environment Prior Level of Function : Needs assist             Mobility Comments: Family uses transfer sheet to move pt into a chair but has not done that in a month or 2 ADLs Comments: Family assists from bed level and place her on bed pan for toileting needs.                 OT Goals(Current goals can be found in the care plan section) Acute Rehab OT Goals Patient Stated Goal: to return home OT Goal Formulation: With patient Time For Goal Achievement: 05/17/22 Potential to Achieve Goals: Fair   AM-PAC OT "6 Clicks" Daily Activity     Outcome Measure Help from another person eating meals?: None Help from another person taking care of personal grooming?: None Help from another person toileting, which includes using toliet, bedpan, or urinal?: Total Help from another person bathing (including washing, rinsing, drying)?: A Lot Help from another person to put on and taking off regular upper body clothing?: A Little Help from another person to put on and taking off regular lower body clothing?: A Lot 6 Click Score: 16   End of Session Nurse Communication: Mobility status  Activity Tolerance: Patient tolerated treatment well Patient left: in bed;with nursing/sitter in room                   Time: 1347-1415 OT Time Calculation (min): 28 min Charges:  OT General Charges $OT Visit: 1 Visit OT Evaluation $OT Eval Moderate Complexity: 1 Mod OT Treatments $Therapeutic Activity: 8-22 mins  Darleen Crocker, MS, OTR/L , CBIS ascom [phone]  05/17/22, 2:47 PM

## 2022-05-17 NOTE — Evaluation (Signed)
Physical Therapy Evaluation Patient Details Name: Kayla Reyes MRN: [id number] DOB: 05-Mar-1950 Today's Date: 05/17/2022  History of Present Illness  Pt is a 72 y.o. female presenting to hospital 05/10/22 for unresponsiveness.  Pt admitted with suspected severe sepsis with shock, AKI, NAGMA, mild hypokalemia, acute hypoxic respiratory failure, and HFpEF with acute exacerbation.  PMH includes h/o PAF, arterial thrombosis on Coumadin, htn, RA, pulmonary edema, L AKA.  Clinical Impression  Pt A&O x4 but pt seemed confused during eval.  Prior to hospital admission, per OT (who called pt's sister to obtain information): pt lives with her sister and niece who care for pt from bed level; pt initially reporting she sits up on EOB with assistance and scoots into chair but family reports it has been several months since pt has been OOB and they use a transfer sheet to slide her into chair if needed.  Pt appears close to recent baseline level of function at this time.  Recommend hospital bed, hoyer lift, and hoyer sling to improve care at home.  No acute PT needs identified; PT will sign off at this time.   Recommendations for follow up therapy are one component of a multi-disciplinary discharge planning process, led by the attending physician.  Recommendations may be updated based on patient status, additional functional criteria and insurance authorization.        Assistance Recommended at Discharge Frequent or constant Supervision/Assistance  Patient can return home with the following  Two people to help with walking and/or transfers;Two people to help with bathing/dressing/bathroom;Assistance with cooking/housework;Assist for transportation;Help with stairs or ramp for entrance    Equipment Recommendations Other (comment);Hospital bed (hoyer lift; sling for hoyer lift)  Recommendations for Other Services       Functional Status Assessment Patient has not had a recent decline in their functional status      Precautions / Restrictions Precautions Precautions: Fall Restrictions Weight Bearing Restrictions: No      Mobility  Bed Mobility Overal bed mobility: Needs Assistance Bed Mobility: Rolling Rolling: Max assist         General bed mobility comments: 3 assist to boost pt up in bed using bed sheet    Transfers                   General transfer comment: Deferred d/t pt dependent transfer with transfer sheet at home.    Ambulation/Gait                  Stairs            Wheelchair Mobility    Modified Rankin (Stroke Patients Only)       Balance                                             Pertinent Vitals/Pain Pain Assessment Pain Assessment: No/denies pain    Home Living Family/patient expects to be discharged to:: Private residence Living Arrangements: Other relatives;Other (Comment) (Sister and niece) Available Help at Discharge: Family;Available 24 hours/day Type of Home: House Home Access: Ramped entrance       Home Layout: Two level;Able to live on main level with bedroom/bathroom Home Equipment: Other (comment);Wheelchair - manual Additional Comments: bed pan and transfer sheet    Prior Function Prior Level of Function : Needs assist             Mobility  Comments: Per OT (who spoke to pt's family): "Family uses transfer sheet to move pt into a chair but has not done that in a month or 2 ADLs Comments: Per OT (who spoke to pt's family): "Family assists from bed level and place her on bed pan for toileting needs."     Hand Dominance   Dominant Hand: Right    Extremity/Trunk Assessment   Upper Extremity Assessment Upper Extremity Assessment: Generalized weakness    Lower Extremity Assessment Lower Extremity Assessment: RLE deficits/detail;LLE deficits/detail RLE Deficits / Details: Grossly 25-30 degrees PF contracture; mild decreased R knee extension ROM; R knee flexion grossly 70-80 degrees  with R hip/LE externally rotated; R LE grossly 3-/5 strength LLE Deficits / Details: AKA       Communication   Communication: No difficulties  Cognition Arousal/Alertness: Awake/alert Behavior During Therapy: WFL for tasks assessed/performed Overall Cognitive Status: Within Functional Limits for tasks assessed                                 General Comments: A&O x4 but some confusion noted during session        General Comments  Nursing cleared pt for participation in physical therapy.  Pt agreeable to PT session.    Exercises     Assessment/Plan    PT Assessment Patient does not need any further PT services  PT Problem List         PT Treatment Interventions      PT Goals (Current goals can be found in the Care Plan section)  Acute Rehab PT Goals Patient Stated Goal: to go home PT Goal Formulation: With patient Time For Goal Achievement: 05/31/22 Potential to Achieve Goals: Good    Frequency       Co-evaluation               AM-PAC PT "6 Clicks" Mobility  Outcome Measure Help needed turning from your back to your side while in a flat bed without using bedrails?: A Lot Help needed moving from lying on your back to sitting on the side of a flat bed without using bedrails?: Total Help needed moving to and from a bed to a chair (including a wheelchair)?: Total Help needed standing up from a chair using your arms (e.g., wheelchair or bedside chair)?: Total Help needed to walk in hospital room?: Total Help needed climbing 3-5 steps with a railing? : Total 6 Click Score: 7    End of Session   Activity Tolerance: Patient tolerated treatment well Patient left: in bed;with call bell/phone within reach Nurse Communication: Mobility status;Precautions PT Visit Diagnosis: Other abnormalities of gait and mobility (R26.89)    Time: [id number] PT Time Calculation (min) (ACUTE ONLY): 14 min   Charges:   PT Evaluation $PT Eval Low Complexity: 1  Low          Berma Harts, PT 05/17/22, 5:44 PM

## 2022-05-17 NOTE — Consult Note (Signed)
PHARMACY CONSULT NOTE - FOLLOW UP  Pharmacy Consult for Electrolyte Monitoring and Replacement   Recent Labs: Potassium (mmol/L)  Date Value  05/17/2022 3.3 (L)   Magnesium (mg/dL)  Date Value  05/17/2022 2.3   Calcium (mg/dL)  Date Value  05/17/2022 7.7 (L)   Albumin (g/dL)  Date Value  05/10/2022 2.7 (L)  02/19/2018 3.1 (L)   Phosphorus (mg/dL)  Date Value  05/17/2022 3.7   Sodium (mmol/L)  Date Value  05/17/2022 134 (L)  02/19/2018 137    Assessment: 72 yo F presenting to [facility] ED from home via EMS on 05/10/22 for evaluation of altered mental status. Family and patient report that she was in her normal state of health until late in the evening on 05/09/22 when she began having significant chills. Then she became less responsive prompting family to call EMS. Pharmacy has been consulted to monitor and replace electrolytes while under PCCM care. -s/p cardioversion 3/29. On amiodarone 400 mg BID.   Goal of Therapy:  Electrolytes WNL  Plan:  - KCL 40 mEq x 1 PO. Pharmacy will sign off as patient has transferred from the ICU. Please re-consult if needed.    Eleonore Chiquito, PharmD, BCPS Clinical Pharmacist  05/17/2022 10:11 AM

## 2022-05-17 NOTE — Progress Notes (Signed)
Progress Note  Patient Name: Kayla Reyes Date of Encounter: 05/17/2022  Primary Cardiologist: Fletcher Anon  Subjective   She is maintaining in sinus rhythm and denies chest pain or shortness of breath.  Inpatient Medications    Scheduled Meds:  amiodarone  400 mg Oral BID   vitamin C  500 mg Per Tube BID   Chlorhexidine Gluconate Cloth  6 each Topical Daily   docusate  100 mg Per Tube BID   feeding supplement  237 mL Oral TID BM   hydrocortisone sod succinate (SOLU-CORTEF) inj  100 mg Intravenous Daily   hydroxychloroquine  400 mg Oral Daily   insulin aspart  0-15 Units Subcutaneous TID WC   levalbuterol  0.63 mg Nebulization TID   And   ipratropium  0.5 mg Nebulization TID   leptospermum manuka honey  1 Application Topical Daily   levothyroxine  137 mcg Per Tube QAC breakfast   liver oil-zinc oxide   Topical TID   metoCLOPramide (REGLAN) injection  10 mg Intravenous Q8H   metoprolol tartrate  25 mg Oral BID   multivitamin with minerals  1 tablet Oral Daily   ondansetron (ZOFRAN) IV  4 mg Intravenous Q6H   pantoprazole (PROTONIX) IV  40 mg Intravenous Daily   polyethylene glycol  17 g Per Tube Daily   sodium chloride flush  10-40 mL Intracatheter Q12H   thiamine (VITAMIN B1) injection  100 mg Intravenous Daily   Continuous Infusions:  sodium chloride 10 mL/hr at 05/14/22 1900   sodium chloride 5 mL/hr at 05/16/22 2000   fluconazole (DIFLUCAN) IV Stopped (05/16/22 1814)   heparin 1,900 Units/hr (05/17/22 0307)   promethazine (PHENERGAN) injection (IM or IVPB) Stopped (05/13/22 0533)   PRN Meds: acetaminophen **OR** acetaminophen, mouth rinse, promethazine (PHENERGAN) injection (IM or IVPB), sodium chloride flush   Vital Signs    Vitals:   05/16/22 2143 05/17/22 0006 05/17/22 0442 05/17/22 0804  BP: 100/61 96/74 (!) 95/59 (!) 117/51  Pulse: 79 78 75 76  Resp: 20 18 18 18   Temp: 98.4 F (36.9 C) 98.6 F (37 C) 98 F (36.7 C) 98.2 F (36.8 C)  TempSrc:  Oral Oral  Oral  SpO2: 98% 99% 100% 100%  Weight:   (!) 174.4 kg   Height:        Intake/Output Summary (Last 24 hours) at 05/17/2022 0907 Last data filed at 05/17/2022 0442 Gross per 24 hour  Intake 918.56 ml  Output 1100 ml  Net -181.44 ml    Filed Weights   05/14/22 0500 05/15/22 0402 05/17/22 0442  Weight: (!) 172.1 kg (!) 174.4 kg (!) 174.4 kg    Telemetry    Maintaining sinus rhythm since ~ 1000 on 3/31 - Personally Reviewed  ECG    No new tracings - Personally Reviewed  Physical Exam   GEN: No acute distress.   Neck: No JVD. Cardiac: RRR, no murmurs, rubs, or gallops.  Respiratory: Diminished breath sounds bilaterally.  GI: Soft, nontender, non-distended.   MS: No edema. Neuro:  Alert and oriented x 3; Nonfocal.  Psych: Normal affect.  Labs    Chemistry Recent Labs  Lab 05/10/22 1437 05/10/22 2122 05/10/22 2146 05/15/22 0637 05/16/22 0247 05/17/22 0210  NA 133* 137   < > 138 135 134*  K 3.7 3.6   < > 3.4* 3.5 3.3*  CL 103 105   < > 105 103 101  CO2 15* 21*   < > 25 25 25   GLUCOSE 174* 167*   < >  109* 141* 179*  BUN 23 24*   < > 21 20 20   CREATININE 1.71* 1.32*   < > 0.78 0.80 0.82  CALCIUM 7.8* 7.9*   < > 7.6* 7.5* 7.7*  PROT  --  5.7*  --   --   --   --   ALBUMIN 2.6* 2.7*  --   --   --   --   AST  --  73*  --   --   --   --   ALT  --  27  --   --   --   --   ALKPHOS  --  29*  --   --   --   --   BILITOT  --  0.8  --   --   --   --   GFRNONAA 32* 44*   < > >60 >60 >60  ANIONGAP 15 11   < > 8 7 8    < > = values in this interval not displayed.      Hematology Recent Labs  Lab 05/15/22 0637 05/16/22 0247 05/17/22 0210  WBC 10.0 11.8* 11.3*  RBC 4.31 4.27 4.20  HGB 12.9 12.9 12.4  HCT 37.8 37.9 37.2  MCV 87.7 88.8 88.6  MCH 29.9 30.2 29.5  MCHC 34.1 34.0 33.3  RDW 16.7* 16.8* 17.1*  PLT 85* 96* 144*     Cardiac EnzymesNo results for input(s): "TROPONINI" in the last 168 hours. No results for input(s): "TROPIPOC" in the last 168 hours.    BNP No results for input(s): "BNP", "PROBNP" in the last 168 hours.    DDimer No results for input(s): "DDIMER" in the last 168 hours.   Radiology    No results found.  Cardiac Studies   2D echo 05/10/2022: 1. Left ventricular ejection fraction, by estimation, is 55 to 60%. The  left ventricle has normal function. The left ventricle has no regional  wall motion abnormalities. Left ventricular diastolic parameters were  normal.   2. Right ventricular systolic function is normal. The right ventricular  size is normal. There is normal pulmonary artery systolic pressure.   3. The mitral valve is normal in structure. No evidence of mitral valve  regurgitation. No evidence of mitral stenosis.   4. The aortic valve is normal in structure. Aortic valve regurgitation is  not visualized. Aortic valve sclerosis is present, with no evidence of  aortic valve stenosis.   Patient Profile     72 y.o. 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 Mortimer Fries.   Assessment & Plan    1. PAF with RVR: -Has previously spontaneously converted on 3/26, though redeveloped Afib at 8:45 on 3/27, which persisted  -Status post successful DCCV on 3/29 -She had recurrent atrial fibrillation over the weekend but converted back to sinus rhythm with IV amiodarone. No atrial fibrillation in the last 36 hours. -I switched amiodarone to oral 400 mg twice daily for 5 days followed by 200 mg twice daily  for 1 week and then 200 mg once daily after. Due to  morbid obesity with BMI of 60, she is not a candidate for A-fib ablation and I suspect that she will require ongoing antiarrhythmic therapy to keep her in sinus rhythm. -CHADS2VASc 5 -PTA warfarin on hold.  Currently on heparin drip -Thrombocytopenia is improving.  Consider transitioning back to warfarin if no plans for any invasive procedures. Continue metoprolol 25 mg twice daily.   2. Severe sepsis with shock: -Improving -Due to infected infected panus -No longer requiring vasopressor support  -ABX per CCM   3. AKI: -Resolved   4. HFpEF: -Well compensated       For questions or updates, please contact [place] Please consult [email or web address] for contact info under Cardiology/STEMI.    Signed, Kathlyn Sacramento, MD [facility] 05/17/2022, 9:07 AM

## 2022-05-18 DIAGNOSIS — I480 Paroxysmal atrial fibrillation: Secondary | ICD-10-CM | POA: Diagnosis not present

## 2022-05-18 DIAGNOSIS — B372 Candidiasis of skin and nail: Secondary | ICD-10-CM | POA: Diagnosis not present

## 2022-05-18 DIAGNOSIS — L89159 Pressure ulcer of sacral region, unspecified stage: Secondary | ICD-10-CM | POA: Diagnosis not present

## 2022-05-18 DIAGNOSIS — I4891 Unspecified atrial fibrillation: Secondary | ICD-10-CM | POA: Diagnosis not present

## 2022-05-18 LAB — PROTIME-INR
INR: 2.8 — ABNORMAL HIGH (ref 0.8–1.2)
Prothrombin Time: 29.1 seconds — ABNORMAL HIGH (ref 11.4–15.2)

## 2022-05-18 LAB — GLUCOSE, CAPILLARY
Glucose-Capillary: 82 mg/dL (ref 70–99)
Glucose-Capillary: 100 mg/dL — ABNORMAL HIGH (ref 70–99)
Glucose-Capillary: 106 mg/dL — ABNORMAL HIGH (ref 70–99)

## 2022-05-18 MED ORDER — EMPAGLIFLOZIN 10 MG PO TABS
10.0000 mg | ORAL_TABLET | Freq: Every day | ORAL | Status: DC
  Administered 2022-05-18 – 2022-05-19 (×2): 10 mg via ORAL
  Filled 2022-05-18 (×2): qty 1

## 2022-05-18 MED ORDER — GABAPENTIN 300 MG PO CAPS
600.0000 mg | ORAL_CAPSULE | Freq: Three times a day (TID) | ORAL | Status: DC
  Administered 2022-05-18 – 2022-05-19 (×4): 600 mg via ORAL
  Filled 2022-05-18 (×4): qty 2

## 2022-05-18 MED ORDER — PRAVASTATIN SODIUM 20 MG PO TABS
20.0000 mg | ORAL_TABLET | Freq: Every day | ORAL | Status: DC
  Administered 2022-05-18: 20 mg via ORAL
  Filled 2022-05-18: qty 1

## 2022-05-18 MED ORDER — LINAGLIPTIN 5 MG PO TABS
5.0000 mg | ORAL_TABLET | Freq: Every day | ORAL | Status: DC
  Administered 2022-05-19: 5 mg via ORAL
  Filled 2022-05-18 (×3): qty 1

## 2022-05-18 MED ORDER — PREDNISONE 1 MG PO TABS
1.0000 mg | ORAL_TABLET | Freq: Every day | ORAL | Status: DC
  Administered 2022-05-19: 1 mg via ORAL
  Filled 2022-05-18: qty 1

## 2022-05-18 MED ORDER — FUROSEMIDE 40 MG PO TABS
40.0000 mg | ORAL_TABLET | Freq: Every day | ORAL | Status: DC
  Administered 2022-05-18 – 2022-05-19 (×2): 40 mg via ORAL
  Filled 2022-05-18 (×2): qty 1

## 2022-05-18 NOTE — Progress Notes (Signed)
[facility] NAME: Gerri Drinnon    MR#:  [id number]  DATE OF BIRTH:  01/08/1951  SUBJECTIVE:  patient seen earlier. Sitting upright. Ate good breakfast. No family at bedside. No new complaints. Overall slowly improving. Vitals stable.    VITALS:  Blood pressure (!) 140/59, pulse 82, temperature 97.9 F (36.6 C), temperature source Oral, resp. rate 20, height 5\' 7"  (1.702 m), weight (!) 164.8 kg, SpO2 98 %.  PHYSICAL EXAMINATION:   GENERAL:  72 y.o.-year-old patient with no acute distress. Morbidly obese LUNGS: Normal breath sounds bilaterally, no wheezing CARDIOVASCULAR: S1, S2 normal. No murmur   ABDOMEN: Soft, severely obese with severe hyperpigmentation  EXTREMITIES: left BKA+ NEUROLOGIC: nonfocal  patient is alert and awake  LABORATORY PANEL:  CBC Recent Labs  Lab 05/17/22 0210  WBC 11.3*  HGB 12.4  HCT 37.2  PLT 144*     Chemistries  Recent Labs  Lab 05/17/22 0210  NA 134*  K 3.3*  CL 101  CO2 25  GLUCOSE 179*  BUN 20  CREATININE 0.82  CALCIUM 7.7*  MG 2.3     Assessment and Plan  72 year old female was admitted with sepsis.  Workup was unrevealing except for infection under patient's large pannus.  She has been treated with broad-spectrum antibiotics is now presently on linezolid and fluconazole.  She has been weaned off of pressors.   Patient is also had atrial fibrillation with RVR.  She underwent DCCV 3/29 but now has reverted back to A-fib today.   Septic shock due to infected Pannus --Thought to be soft tissue infection of her large pannus --Patient is somewhat immunocompromised on Plaquenil, MTX being held --Patient was treated with broad-spectrum antibiotics, initially with cefepime and vancomycin  -She subsequently was changed to then meropenem from 3/26 through 3/28. --She was started on linezolid 3/28--completed course --She is also on fluconazole for associated Candida infection under  her pannus 6/10 days --overall progressing well.   Atrial fibrillation --S/p DCCV 3/29  however patient is reverted back to AF and then spontaneously converted back to NSR on Ipo amiodarone. --Followed by cardiology Dr Fletcher Anon --Continue metoprolol 25 twice daily. -- Okay to switch to oral anticoagulation. 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 --She is on chronic anticoagulation with warfarin at home --d/c  heparin drip and will  transition to warfarin. INR therapeutic -- patient monitors her INR at home and results are reviewed by PCP.    DVT prophylaxis: coumadin Code Status: Full Family Communication: spoke with sister Coralene today  Consults :[facility] cardiology Level of care: Progressive Status is: Inpatient Remains inpatient appropriate because: overall improving.  If remains stable will discharge to home tomorrow. Patient and sister are aware and agreeable with plan  TOTAL TIME TAKING CARE OF THIS PATIENT: 35 minutes.  >50% time spent on counselling and coordination of care  Note: This dictation was prepared with  Dragon dictation along with smaller Company secretary. Any transcriptional errors that result from this process are unintentional.  Fritzi Mandes M.D    [facility]   CC: Primary care physician; Marguerita Merles, MD

## 2022-05-18 NOTE — TOC Progression Note (Signed)
Transition of Care [facility]) - Progression Note    Patient Details  Name: Kayla Reyes MRN: [id number] Date of Birth: [birth year]  Transition of Care [facility]) CM/SW Contact  Laurena Slimmer, RN Phone Number: 05/18/2022, 2:45 PM  Clinical Narrative:    Spoke with patient regarding discharge plan and DME. She was advised of likely discharge tomorrow. She has also been advised someone will need to be present in the home so the bed can be delivered.          Expected Discharge Plan and Services                                               Social Determinants of Health (SDOH) Interventions SDOH Screenings   Tobacco Use: Medium Risk (05/15/2022)    Readmission Risk Interventions     No data to display

## 2022-05-18 NOTE — Progress Notes (Signed)
ANTICOAGULATION CONSULT NOTE -   Pharmacy Consult for Warfarin Indication:  arterial thrombosis , Afib  No Known Allergies  Patient Measurements: Height: 5\' 7"  (170.2 cm) Weight: (!) 164.8 kg (363 lb 5.1 oz) IBW/kg (Calculated) : 61.6 Heparin Dosing Weight: 104 kg   Vital Signs: Temp: 98.6 F (37 C) (04/03 0652) Temp Source: Oral (04/03 0652) BP: 121/56 (04/03 0652) Pulse Rate: 73 (04/03 0652)  Labs: Recent Labs    05/16/22 0247 05/16/22 1055 05/16/22 1719 05/17/22 0210 05/17/22 1042 05/17/22 1905 05/18/22 0700  HGB 12.9  --   --  12.4  --   --   --   HCT 37.9  --   --  37.2  --   --   --   PLT 96*  --   --  144*  --   --   --   LABPROT  --   --   --   --  29.9* 28.4* 29.1*  INR  --   --   --   --  2.9* 2.7* 2.8*  HEPARINUNFRC 0.81*   < > 0.84* 0.75* 0.68  --   --   CREATININE 0.80  --   --  0.82  --   --   --    < > = values in this interval not displayed.     Estimated Creatinine Clearance: 105.2 mL/min (by C-G formula based on SCr of 0.82 mg/dL).   Medical History: Past Medical History:  Diagnosis Date   Arterial thrombosis ([place])    a. s/p thromboectomy and thrombolysis with left BKA in 2008 on chronic Coumadin    HLD (hyperlipidemia)    Hypertension    Hypothyroidism    Obesity    PAF (paroxysmal atrial fibrillation) ([place])    a. noted 12/19 in the setting of hypokalemia; b. CHADS2VASc 4 (HTN, age x 1, vascular disease, female); c. on chronic Coumadin in the setting of prior arterial clot   Rheumatoid arthritis ([place])     Medications:  Medications Prior to Admission  Medication Sig Dispense Refill Last Dose   albuterol (PROVENTIL HFA;VENTOLIN HFA) 108 (90 Base) MCG/ACT inhaler Inhale 2 puffs into the lungs every 6 (six) hours as needed for wheezing or shortness of breath.   prn at unk   fluticasone (FLONASE) 50 MCG/ACT nasal spray Place 1 spray into both nostrils daily as needed for allergies or rhinitis.   prn at unk   furosemide (LASIX) 40 MG tablet  Take 40 mg by mouth daily.      gabapentin (NEURONTIN) 600 MG tablet Take 600 mg by mouth 3 (three) times daily.      hydroxychloroquine (PLAQUENIL) 200 MG tablet TAKE 2 TABLETS(400 MG) BY MOUTH EVERY DAY      levothyroxine (SYNTHROID) 150 MCG tablet Take 150 mcg by mouth daily.      lovastatin (MEVACOR) 20 MG tablet Take 20 mg by mouth every evening.      methotrexate (RHEUMATREX) 2.5 MG tablet Take 2.5 mg by mouth once a week.       metoprolol tartrate (LOPRESSOR) 25 MG tablet Take 25 mg by mouth 2 (two) times daily.       potassium chloride (KLOR-CON M) 10 MEQ tablet Take 10 mEq by mouth daily.      predniSONE (DELTASONE) 1 MG tablet Take by mouth.      TRADJENTA 5 MG TABS tablet Take 5 mg by mouth daily.      warfarin (COUMADIN) 10 MG tablet Take 10 mg  by mouth daily.      furosemide (LASIX) 20 MG tablet Take 1 tablet (20 mg total) by mouth daily. 30 tablet 5    JARDIANCE 10 MG TABS tablet Take 10 mg by mouth daily.   unk at unk   levothyroxine (SYNTHROID, LEVOTHROID) 137 MCG tablet Take 137 mcg by mouth daily before breakfast. (Patient not taking: Reported on 05/10/2022)   Not Taking   predniSONE (DELTASONE) 5 MG tablet Take 5 mg by mouth daily with breakfast.  (Patient not taking: Reported on 05/10/2022)   Not Taking    Assessment: 72 y.o. 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 Patient now has afib with RVR CHADSVASc 5. Pharmacy consulted to start warfarin. Warfarin has been held for 7 days and INR is still elevated. New start amio and fluconazole. Plan for 10 days of fluconazole.   Date INR Warfarin Dose  4/2 2.9 HOLD  4/3 2.8 HOLD      Goal of Therapy:  INR 2-3 Monitor platelets by anticoagulation protocol: Yes   Plan:  Stopped heparin infusion  due INR at the upper limit of goal. Will hold warfarin dose today.  Daily INR. CBC daily while on heparin.   Discharge recommendation: depending on INR, if still consistently above 2.5 without warfarin doses, would defer to the provider managing warfarin outpatient with close follow up and decrease warfarin dose to 2 mg daily. If INR starts trending down, recommend discharging on warfarin 5 mg daily due to new DDI with amiodarone.   Eleonore Chiquito, PharmD, BCPS Clinical Pharmacist  05/18/2022 8:11 AM

## 2022-05-18 NOTE — Progress Notes (Signed)
Patient has pulmonary edema,left BKA, rheumatoid arthritis and a sacral wound which requires positioning in ways not feasible with a normal bed. Difficulty breathing,shortness of breath, pain, skin breakdown, and infection may occur.  Pulmonary edema, Left BKA, rheumatoid arthritis and sacral wounds frequently and upon awakening requires frequent and immediate changes in body position which cannot be achieved with a normal bed.

## 2022-05-18 NOTE — Progress Notes (Signed)
Progress Note  Patient Name: Kayla Reyes Date of Encounter: 05/18/2022  Primary Cardiologist: Fletcher Anon  Subjective   She is maintaining in sinus rhythm and denies chest pain or shortness of breath. INR therapeutic.   Inpatient Medications    Scheduled Meds:  [START ON 05/22/2022] amiodarone  200 mg Oral BID   Followed by   Derrill Memo ON 05/29/2022] amiodarone  200 mg Oral Daily   amiodarone  400 mg Oral BID   vitamin C  500 mg Oral BID   Chlorhexidine Gluconate Cloth  6 each Topical Daily   docusate sodium  100 mg Oral BID   empagliflozin  10 mg Oral Daily   feeding supplement  237 mL Oral TID BM   fluconazole  200 mg Oral Daily   furosemide  40 mg Oral Daily   gabapentin  600 mg Oral TID   hydroxychloroquine  400 mg Oral Daily   insulin aspart  0-15 Units Subcutaneous TID WC   leptospermum manuka honey  1 Application Topical Daily   levothyroxine  137 mcg Oral QAC breakfast   linagliptin  5 mg Oral Daily   liver oil-zinc oxide   Topical TID   metoprolol tartrate  25 mg Oral BID   multivitamin with minerals  1 tablet Oral Daily   pantoprazole  40 mg Oral Daily   polyethylene glycol  17 g Oral Daily   pravastatin  20 mg Oral q1800   [START ON 05/19/2022] predniSONE  1 mg Oral Q breakfast   sodium chloride flush  10-40 mL Intracatheter Q12H   thiamine  100 mg Oral Daily   Warfarin - Pharmacist Dosing Inpatient   Does not apply q1600   Continuous Infusions:  sodium chloride 10 mL/hr at 05/14/22 1900   sodium chloride 5 mL/hr at 05/16/22 2000   promethazine (PHENERGAN) injection (IM or IVPB) Stopped (05/13/22 0533)   PRN Meds: acetaminophen **OR** acetaminophen, levalbuterol **AND** ipratropium, ondansetron, mouth rinse, promethazine (PHENERGAN) injection (IM or IVPB), sodium chloride flush   Vital Signs    Vitals:   05/18/22 0523 05/18/22 0652 05/18/22 0847 05/18/22 1144  BP: (!) 111/46 (!) 121/56 (!) 124/50 (!) 140/59  Pulse: 75 73 74 82  Resp: 20 20 20 20   Temp: 98.3  F (36.8 C) 98.6 F (37 C) 98.3 F (36.8 C) 97.9 F (36.6 C)  TempSrc: Oral Oral Oral Oral  SpO2: 100% 98% 99% 98%  Weight:      Height:        Intake/Output Summary (Last 24 hours) at 05/18/2022 1150 Last data filed at 05/17/2022 1715 Gross per 24 hour  Intake 470 ml  Output --  Net 470 ml    Filed Weights   05/15/22 0402 05/17/22 0442 05/18/22 0454  Weight: (!) 174.4 kg (!) 174.4 kg (!) 164.8 kg    Telemetry    Maintaining sinus rhythm since ~ 1000 on 3/31 - Personally Reviewed  ECG    No new tracings - Personally Reviewed  Physical Exam   GEN: No acute distress.   Neck: JVD difficult to assess secondary to body habitus. Cardiac: RRR, no murmurs, rubs, or gallops. Distant heart sounds.  Respiratory: Diminished breath sounds bilaterally.  GI: Soft, nontender, non-distended.   MS: No edema. Status post left BKA.  Neuro:  Alert and oriented x 3; Nonfocal.  Psych: Normal affect.  Labs    Chemistry Recent Labs  Lab 05/15/22 0637 05/16/22 0247 05/17/22 0210  NA 138 135 134*  K 3.4* 3.5 3.3*  CL 105 103 101  CO2 25 25 25   GLUCOSE 109* 141* 179*  BUN 21 20 20   CREATININE 0.78 0.80 0.82  CALCIUM 7.6* 7.5* 7.7*  GFRNONAA >60 >60 >60  ANIONGAP 8 7 8       Hematology Recent Labs  Lab 05/15/22 0637 05/16/22 0247 05/17/22 0210  WBC 10.0 11.8* 11.3*  RBC 4.31 4.27 4.20  HGB 12.9 12.9 12.4  HCT 37.8 37.9 37.2  MCV 87.7 88.8 88.6  MCH 29.9 30.2 29.5  MCHC 34.1 34.0 33.3  RDW 16.7* 16.8* 17.1*  PLT 85* 96* 144*     Cardiac EnzymesNo results for input(s): "TROPONINI" in the last 168 hours. No results for input(s): "TROPIPOC" in the last 168 hours.   BNP No results for input(s): "BNP", "PROBNP" in the last 168 hours.    DDimer No results for input(s): "DDIMER" in the last 168 hours.   Radiology    No results found.  Cardiac Studies   2D echo 05/10/2022: 1. Left ventricular ejection fraction, by estimation, is 55 to 60%. The  left ventricle has  normal function. The left ventricle has no regional  wall motion abnormalities. Left ventricular diastolic parameters were  normal.   2. Right ventricular systolic function is normal. The right ventricular  size is normal. There is normal pulmonary artery systolic pressure.   3. The mitral valve is normal in structure. No evidence of mitral valve  regurgitation. No evidence of mitral stenosis.   4. The aortic valve is normal in structure. Aortic valve regurgitation is  not visualized. Aortic valve sclerosis is present, with no evidence of  aortic valve stenosis.   Patient Profile     73 y.o. 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 Mortimer Fries.   Assessment & Plan    1. PAF with RVR: -Has previously spontaneously converted on 3/26, though redeveloped Afib at 8:45 on 3/27, which persisted  -Status post successful DCCV on 3/29 -She had recurrent atrial fibrillation over the weekend but converted back to sinus rhythm with IV amiodarone. -No atrial fibrillation in the last 48 hours. -Continue oral amiodarone to oral 400 mg twice daily through 4/7 followed by 200 mg twice daily for 1 week and then 200 mg once daily after, starting on 4/15. Due to morbid obesity with BMI of 60, she is not a candidate for A-fib ablation -Suspect that she will require ongoing antiarrhythmic therapy to keep her in sinus rhythm -CHADS2VASc 5 -Back on PTA warfarin with therapeutic INR -Thrombocytopenia  is improving -Continue metoprolol 25 mg twice daily.   2. Severe sepsis with shock: -Improving -Due to infected infected panus -No longer requiring vasopressor support  -ABX per CCM   3. AKI: -Resolved   4. HFpEF: -Well compensated      For questions or updates, please contact [place] Please consult [email or web address] for contact info under Cardiology/STEMI.    Signed, Kathlyn [place], MD [facility] 05/18/2022, 11:50 AM

## 2022-05-19 DIAGNOSIS — A419 Sepsis, unspecified organism: Secondary | ICD-10-CM | POA: Diagnosis not present

## 2022-05-19 DIAGNOSIS — I480 Paroxysmal atrial fibrillation: Secondary | ICD-10-CM | POA: Diagnosis not present

## 2022-05-19 DIAGNOSIS — M793 Panniculitis, unspecified: Secondary | ICD-10-CM

## 2022-05-19 LAB — GLUCOSE, CAPILLARY
Glucose-Capillary: 121 mg/dL — ABNORMAL HIGH (ref 70–99)
Glucose-Capillary: 131 mg/dL — ABNORMAL HIGH (ref 70–99)

## 2022-05-19 LAB — PROTIME-INR
INR: 2.5 — ABNORMAL HIGH (ref 0.8–1.2)
Prothrombin Time: 26.9 seconds — ABNORMAL HIGH (ref 11.4–15.2)

## 2022-05-19 MED ORDER — ENSURE ENLIVE PO LIQD: 237.0000 mL | Freq: Three times a day (TID) | ORAL | 12 refills | Status: AC

## 2022-05-19 MED ORDER — PANTOPRAZOLE SODIUM 40 MG PO TBEC: 40.0000 mg | DELAYED_RELEASE_TABLET | Freq: Every day | ORAL | 0 refills | Status: DC

## 2022-05-19 MED ORDER — ZINC OXIDE 40 % EX OINT: TOPICAL_OINTMENT | Freq: Three times a day (TID) | CUTANEOUS | 0 refills | Status: DC

## 2022-05-19 MED ORDER — FLUCONAZOLE 200 MG PO TABS: 200.0000 mg | ORAL_TABLET | Freq: Every day | ORAL | 0 refills | Status: AC

## 2022-05-19 MED ORDER — LEVOTHYROXINE SODIUM 137 MCG PO TABS: 137.0000 ug | ORAL_TABLET | Freq: Every day | ORAL | 0 refills | Status: DC

## 2022-05-19 MED ORDER — AMIODARONE HCL 200 MG PO TABS: 400.0000 mg | ORAL_TABLET | Freq: Two times a day (BID) | ORAL | 1 refills | Status: DC

## 2022-05-19 MED ORDER — WARFARIN SODIUM 2.5 MG PO TABS
2.5000 mg | ORAL_TABLET | Freq: Once | ORAL | Status: DC
  Filled 2022-05-19: qty 1

## 2022-05-19 MED ORDER — MEDIHONEY WOUND/BURN DRESSING EX PSTE: 1.0000 | PASTE | Freq: Every day | CUTANEOUS | 1 refills | Status: DC

## 2022-05-19 MED ORDER — WARFARIN SODIUM 2.5 MG PO TABS: 2.5000 mg | ORAL_TABLET | Freq: Once | ORAL | 0 refills | Status: DC

## 2022-05-19 MED ORDER — POLYETHYLENE GLYCOL 3350 17 G PO PACK: 17.0000 g | PACK | Freq: Every day | ORAL | 0 refills | Status: AC

## 2022-05-19 MED ORDER — DOCUSATE SODIUM 100 MG PO CAPS: 100.0000 mg | ORAL_CAPSULE | Freq: Two times a day (BID) | ORAL | 0 refills | Status: AC

## 2022-05-19 MED ORDER — ASCORBIC ACID 500 MG PO TABS: 500.0000 mg | ORAL_TABLET | Freq: Two times a day (BID) | ORAL | 0 refills | Status: AC

## 2022-05-19 NOTE — TOC Transition Note (Signed)
Transition of Care [facility]) - CM/SW Discharge Note   Patient Details  Name: Kayla Reyes MRN: [id number] Date of Birth: August 05, 1950  [facility] CM/SW Contact:  Laurena Slimmer, RN Phone Number: 05/19/2022, 10:09 AM   Clinical Narrative:    Spoke patient and her sister regarding discharge today. Patient will need EMS. Her sister is at home to let her in.  Per Cyril Mourning from [place]. DME will be delivered today  TOC signing off .         Patient Goals and CMS Choice      Discharge Placement                         Discharge Plan and Services Additional resources added to the After Visit Summary for                                       Social Determinants of Health (SDOH) Interventions SDOH Screenings   Tobacco Use: Medium Risk (05/15/2022)     Readmission Risk Interventions     No data to display

## 2022-05-19 NOTE — Discharge Instructions (Signed)
Warfarin Dosing Recommendations:  IF INR is > 2.5 take warfarin 2.5 mg  IF INR is < 2.5 take warfarin 5 mg (2 of the 2.5 mg tablets) IF INR > 3. Hold warfarin and call provider managing warfarin dose for direction.

## 2022-05-19 NOTE — Progress Notes (Addendum)
ANTICOAGULATION CONSULT NOTE -   Pharmacy Consult for Warfarin Indication:  arterial thrombosis , Afib  No Known Allergies  Patient Measurements: Height: 5\' 7"  (170.2 cm) Weight: (!) 169.5 kg (373 lb 10.9 oz) IBW/kg (Calculated) : 61.6 Heparin Dosing Weight: 104 kg   Vital Signs: Temp: 98.3 F (36.8 C) (04/04 0338) Temp Source: Oral (04/04 0338) BP: 104/56 (04/04 0338) Pulse Rate: 83 (04/04 0338)  Labs: Recent Labs    05/16/22 1719 05/17/22 0210 05/17/22 1042 05/17/22 1042 05/17/22 1905 05/18/22 0700 05/19/22 0701  HGB  --  12.4  --   --   --   --   --   HCT  --  37.2  --   --   --   --   --   PLT  --  144*  --   --   --   --   --   LABPROT  --   --  29.9*   < > 28.4* 29.1* 26.9*  INR  --   --  2.9*   < > 2.7* 2.8* 2.5*  HEPARINUNFRC 0.84* 0.75* 0.68  --   --   --   --   CREATININE  --  0.82  --   --   --   --   --    < > = values in this interval not displayed.     Estimated Creatinine Clearance: 107.1 mL/min (by C-G formula based on SCr of 0.82 mg/dL).   Medical History: Past Medical History:  Diagnosis Date   Arterial thrombosis ([place])    a. s/p thromboectomy and thrombolysis with left BKA in 2008 on chronic Coumadin    HLD (hyperlipidemia)    Hypertension    Hypothyroidism    Obesity    PAF (paroxysmal atrial fibrillation) ([place])    a. noted 12/19 in the setting of hypokalemia; b. CHADS2VASc 4 (HTN, age x 1, vascular disease, female); c. on chronic Coumadin in the setting of prior arterial clot   Rheumatoid arthritis ([place])     Medications:  Medications Prior to Admission  Medication Sig Dispense Refill Last Dose   albuterol (PROVENTIL HFA;VENTOLIN HFA) 108 (90 Base) MCG/ACT inhaler Inhale 2 puffs into the lungs every 6 (six) hours as needed for wheezing or shortness of breath.   prn at unk   fluticasone (FLONASE) 50 MCG/ACT nasal spray Place 1 spray into both nostrils daily as needed for allergies or rhinitis.   prn at unk   furosemide (LASIX) 40 MG  tablet Take 40 mg by mouth daily.      gabapentin (NEURONTIN) 600 MG tablet Take 600 mg by mouth 3 (three) times daily.      hydroxychloroquine (PLAQUENIL) 200 MG tablet TAKE 2 TABLETS(400 MG) BY MOUTH EVERY DAY      levothyroxine (SYNTHROID) 150 MCG tablet Take 150 mcg by mouth daily.      lovastatin (MEVACOR) 20 MG tablet Take 20 mg by mouth every evening.      methotrexate (RHEUMATREX) 2.5 MG tablet Take 2.5 mg by mouth once a week.       metoprolol tartrate (LOPRESSOR) 25 MG tablet Take 25 mg by mouth 2 (two) times daily.       potassium chloride (KLOR-CON M) 10 MEQ tablet Take 10 mEq by mouth daily.      predniSONE (DELTASONE) 1 MG tablet Take by mouth.      TRADJENTA 5 MG TABS tablet Take 5 mg by mouth daily.      warfarin (  COUMADIN) 10 MG tablet Take 10 mg by mouth daily.      furosemide (LASIX) 20 MG tablet Take 1 tablet (20 mg total) by mouth daily. 30 tablet 5    JARDIANCE 10 MG TABS tablet Take 10 mg by mouth daily.   unk at unk   levothyroxine (SYNTHROID, LEVOTHROID) 137 MCG tablet Take 137 mcg by mouth daily before breakfast. (Patient not taking: Reported on 05/10/2022)   Not Taking    Assessment: 72 y.o. 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 Patient now has afib with RVR CHADSVASc 5. Pharmacy consulted to start warfarin. Warfarin has been held for 7 days and INR is still elevated. New start amio and fluconazole. Plan for 10 days of fluconazole.   Date INR Warfarin Dose  4/2 2.9 HOLD  4/3 2.8 HOLD  4/4 2.5 2.5 mg      Goal of Therapy:  INR 2-3 Monitor platelets by anticoagulation protocol: Yes   Plan:  INR is therapeutic starting to trend down. Will give warfarin 2.5 mg x 1. Daily INR ordered. CBC daily while on heparin.   Discharge  recommendation:  Warfarin Dosing Recommendations:  IF INR is > 2.5 take warfarin 2.5 mg  IF INR is < 2.5 take warfarin 5 mg (2 of the 2.5 mg tablets)  IF INR > 3. Hold warfarin and call provider managing warfarin dose for direction.  Predict INR will trend down once pt completes fluconazole course. Educated patient to speak with and contact provider managing warfarin for final warfarin regimen.   Eleonore Chiquito, PharmD, BCPS Clinical Pharmacist  05/19/2022 7:46 AM

## 2022-05-19 NOTE — Discharge Summary (Signed)
Physician Discharge Summary   Patient: Kayla Reyes MRN: [id number] DOB: 09-11-1950  Admit date:     05/10/2022  Discharge date: 05/19/22  Discharge Physician: Fritzi Mandes   PCP: Marguerita Merles, MD   Recommendations at discharge:    F/u Dr Fletcher Anon in 2-3 weeks for Afib F/u Dr Posey Pronto (Rheumatology) in 1-2 weeks F/u PCP in 1-2 weeks  Discharge Diagnoses: Principal Problem:   Sepsis Active Problems:   Fever   AKI (acute kidney injury)   Hypotension   Pressure injury of skin of sacral region   Atrial fibrillation with RVR   Paroxysmal atrial fibrillation  72 year old female was admitted with sepsis.  Workup was unrevealing except for infection under patient's large pannus.  She has been treated with broad-spectrum antibiotics is now presently on linezolid and fluconazole.  She has been weaned off of pressors.   Patient is also had atrial fibrillation with RVR.  She underwent DCCV 3/29 but now has reverted back to A-fib today.   Septic shock due to infected Pannus --Thought to be soft tissue infection of her large pannus --Patient is somewhat immunocompromised on Plaquenil, MTX being held --Patient was treated with broad-spectrum antibiotics, initially with cefepime and vancomycin  -She subsequently was changed to then meropenem from 3/26 through 3/28. --She was started on linezolid 3/28--completed course --She is also on fluconazole for associated Candida infection under her pannus for 10 days --overall progressing well.    Atrial fibrillation --S/p DCCV 3/29  however patient is reverted back to AF and then spontaneously converted back to NSR on Ipo amiodarone. --Followed by cardiology Dr Fletcher Anon --Continue metoprolol 25 twice daily. -- Okay to switch to oral anticoagulation. Pharmacy to start warfarin.    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 --She is on chronic anticoagulation with warfarin at home --d/c  heparin drip and will  transition to warfarin. INR therapeutic -- patient monitors her INR at home and results are reviewed by PCP.    D/c home today. Pt agreeable   DVT prophylaxis: coumadin Code Status: Full Family Communication: spoke with sister Coralene   Consults :[facility] cardiology      Disposition: Home Diet recommendation:  Discharge Diet Orders (From admission, onward)     Start     Ordered   05/19/22 0000  Diet - low sodium heart healthy        05/19/22 0837           Cardiac diet DISCHARGE MEDICATION: Allergies as of 05/19/2022   No Known Allergies      Medication List     TAKE these medications    albuterol 108 (90 Base) MCG/ACT inhaler Commonly known as: VENTOLIN HFA Inhale 2 puffs into the lungs every 6 (six) hours as needed for wheezing or shortness of breath.   amiodarone 200 MG tablet Commonly known as: PACERONE Take 2 tablets (400 mg total) by mouth 2 (two) times daily. Then from 05/22/22--take 200 mg twice a day Then from 05/29/22--take 200  mg daily   ascorbic acid 500 MG tablet Commonly known as: VITAMIN C Take 1 tablet (500 mg total) by mouth 2 (two) times daily.   docusate sodium 100 MG capsule Commonly known as: COLACE Take 1 capsule (100 mg total) by mouth 2 (two) times daily.   feeding supplement Liqd Take 237 mLs by mouth 3 (three) times daily between meals.   Flonase 50 MCG/ACT nasal spray Generic drug: fluticasone Place 1 spray into both  nostrils daily as needed for allergies or rhinitis.   fluconazole 200 MG tablet Commonly known as: DIFLUCAN Take 1 tablet (200 mg total) by mouth daily for 3 days.   furosemide 40 MG tablet Commonly known as: LASIX Take 40 mg by mouth daily. What changed: Another medication with the same name was removed. Continue taking this medication, and follow the directions you see here.   gabapentin 600 MG tablet Commonly known as: NEURONTIN Take 600 mg by mouth 3 (three) times daily.   hydroxychloroquine 200 MG tablet Commonly known as: PLAQUENIL TAKE 2 TABLETS(400 MG) BY MOUTH EVERY DAY   Jardiance 10 MG Tabs tablet Generic drug: empagliflozin Take 10 mg by mouth daily.   leptospermum manuka honey Pste paste Apply 1 Application topically daily.   levothyroxine 137 MCG tablet Commonly known as: SYNTHROID Take 1 tablet (137 mcg total) by mouth daily before breakfast. What changed: Another medication with the same name was removed. Continue taking this medication, and follow the directions you see here.   liver oil-zinc oxide 40 % ointment Commonly known as: DESITIN Apply topically 3 (three) times daily.   lovastatin 20 MG tablet Commonly known as: MEVACOR Take 20 mg by mouth every evening.   methotrexate 2.5 MG tablet Commonly known as: RHEUMATREX Take 2.5 mg by mouth once a week.   metoprolol tartrate 25 MG tablet Commonly known as: LOPRESSOR Take 25 mg by mouth 2 (two) times daily.   pantoprazole 40 MG tablet Commonly known as: PROTONIX Take 1 tablet (40 mg total) by mouth daily.   polyethylene glycol 17 g packet Commonly known as: MIRALAX / GLYCOLAX Take 17 g by mouth daily.   potassium chloride 10 MEQ tablet Commonly known as: KLOR-CON M Take 10 mEq by mouth daily.   predniSONE 1 MG tablet Commonly known as: DELTASONE Take by mouth.   Tradjenta 5 MG Tabs tablet Generic drug: linagliptin Take 5 mg by mouth daily.   warfarin 2.5 MG tablet Commonly known as:  COUMADIN Take 1 tablet (2.5 mg total) by mouth one time only at 4 PM. What changed:  medication strength how much to take when to take this               Durable Medical Equipment  (From admission, onward)           Start     Ordered   05/18/22 1323  For home use only DME Hospital bed  Once       Comments: Hoyer Sling  Question Answer Comment  Length of Need Lifetime   Bed type Heavy-duty, semi-electric (for patients >350 lbs.)   [employer] Yes   Trapeze Bar Yes   Support Surface: Gel Overlay      05/18/22 1322              Discharge Care Instructions  (From admission, onward)           Start     Ordered   05/19/22 0000  Discharge wound care:  Comments: 05/11/22 0500    Wound care  Daily      Comments: Wound care to stage 2 pressure injury to sacrum (POA): cleanse with NS, pat dry. Apply a thin layer of Medihoney, top with dry gauze and secure with silicone foam.  [id number]   05/19/22 0837            Follow-up Information     Marguerita Merles, MD. Schedule an appointment as soon as possible for a visit in 1 week(s).   Specialty: Family Medicine Why: hospital f/u Contact information: [place] [address] [phone]         Defoor, Satira Anis, PA-C. Schedule an appointment as soon as possible for a visit in 1 week(s).   Specialty: Rheumatology Why: for RA Contact information: [place] [address] [phone]                 Filed Weights   05/17/22 [phone] 05/18/22 0454 05/19/22 0338  Weight: (!) 174.4 kg (!) 164.8 kg (!) 169.5 kg     Condition at discharge: fair  The results of significant diagnostics from this hospitalization (including imaging, microbiology, ancillary and laboratory) are listed below for reference.   Imaging Studies: DG Chest Port 1 View  Result Date: 05/12/2022 CLINICAL DATA:  Vomiting. EXAM: PORTABLE CHEST 1 VIEW COMPARISON:  May 10, 2022.  FINDINGS: Stable cardiomediastinal silhouette. Nasogastric tube is seen entering stomach. Mild central pulmonary vascular congestion is noted. Bony thorax is unremarkable. IMPRESSION: Stable cardiomediastinal silhouette with mild central pulmonary vascular congestion. Aortic Atherosclerosis (ICD10-I70.0). Electronically Signed   By: Marijo Conception M.D.   On: 05/12/2022 07:45   DG Abd 1 View  Result Date: 05/12/2022 CLINICAL DATA:  Vomiting. EXAM: ABDOMEN - 1 VIEW COMPARISON:  05/10/2022 FINDINGS: Enteric tube tip overlies the level of the stomach. There is a nonobstructive bowel gas pattern, with contrast identified in nondilated loops of colon. No evidence for free intraperitoneal air. IMPRESSION: Enteric tube tip overlies the level of the stomach. Nonobstructive bowel gas pattern. Electronically Signed   By: Nolon Nations M.D.   On: 05/12/2022 07:44   ECHOCARDIOGRAM COMPLETE  Result Date: 05/11/2022    ECHOCARDIOGRAM REPORT   Patient Name:   MADISSEN SEABRIGHT Date of Exam: 05/10/2022 Medical Rec #:  [id number]      Height:       67.0 in Accession #:    [id number]     Weight:       368.4 lb Date of Birth:  1950/07/18       BSA:          2.622 m Patient Age:    42 years       BP:           90/65 mmHg Patient Gender: F              HR:           90 bpm. Exam Location:  [facility] Procedure: 2D Echo, Cardiac Doppler and Color Doppler Indications:     R94.31 Abnormal EKG  History:         Patient has prior history of Echocardiogram examinations, most                  recent 02/08/2018. Arrythmias:Paroxysmal Atrial Fibrillation;                  Risk Factors:Dyslipidemia and Hypertension.  Sonographer:     NaTashia Rodgers-Jones  RDCS Referring Phys:  [id number] Teressa Lower Diagnosing Phys: Kathlyn Sacramento MD IMPRESSIONS  1. Left ventricular ejection fraction, by estimation, is 55 to 60%. The left ventricle has normal function. The left ventricle has no regional wall motion abnormalities. Left ventricular diastolic  parameters were normal.  2. Right ventricular systolic function is normal. The right ventricular size is normal. There is normal pulmonary artery systolic pressure.  3. The mitral valve is normal in structure. No evidence of mitral valve regurgitation. No evidence of mitral stenosis.  4. The aortic valve is normal in structure. Aortic valve regurgitation is not visualized. Aortic valve sclerosis is present, with no evidence of aortic valve stenosis. FINDINGS  Left Ventricle: Left ventricular ejection fraction, by estimation, is 55 to 60%. The left ventricle has normal function. The left ventricle has no regional wall motion abnormalities. The left ventricular internal cavity size was normal in size. There is  no left ventricular hypertrophy. Left ventricular diastolic parameters were normal. Right Ventricle: The right ventricular size is normal. No increase in right ventricular wall thickness. Right ventricular systolic function is normal. There is normal pulmonary artery systolic pressure. The tricuspid regurgitant velocity is 2.01 m/s, and  with an assumed right atrial pressure of 5 mmHg, the estimated right ventricular systolic pressure is 0000000 mmHg. Left Atrium: Left atrial size was normal in size. Right Atrium: Right atrial size was normal in size. Pericardium: There is no evidence of pericardial effusion. Mitral Valve: The mitral valve is normal in structure. No evidence of mitral valve regurgitation. No evidence of mitral valve stenosis. Tricuspid Valve: The tricuspid valve is normal in structure. Tricuspid valve regurgitation is mild . No evidence of tricuspid stenosis. Aortic Valve: The aortic valve is normal in structure. Aortic valve regurgitation is not visualized. Aortic valve sclerosis is present, with no evidence of aortic valve stenosis. Pulmonic Valve: The pulmonic valve was normal in structure. Pulmonic valve regurgitation is not visualized. No evidence of pulmonic stenosis. Aorta: The aortic root  is normal in size and structure. Venous: The inferior vena cava was not well visualized. IAS/Shunts: No atrial level shunt detected by color flow Doppler.  LEFT VENTRICLE PLAX 2D LVIDd:         4.50 cm Diastology LVIDs:         3.00 cm LV e' medial:    9.68 cm/s LV PW:         0.90 cm LV E/e' medial:  7.8 LV IVS:        0.90 cm LV e' lateral:   6.31 cm/s                        LV E/e' lateral: 12.0  RIGHT VENTRICLE RV Basal diam:  3.50 cm RV S prime:     13.73 cm/s TAPSE (M-mode): 2.6 cm LEFT ATRIUM             Index        RIGHT ATRIUM           Index LA diam:        4.00 cm 1.53 cm/m   RA Area:     13.70 cm LA Vol (A2C):   56.4 ml 21.51 ml/m  RA Volume:   38.50 ml  14.69 ml/m LA Vol (A4C):   62.2 ml 23.73 ml/m LA Biplane Vol: 62.7 ml 23.92 ml/m  AORTIC VALVE LVOT Vmax:   101.10 cm/s LVOT Vmean:  67.533 cm/s LVOT VTI:    0.153  m  AORTA Ao Root diam: 2.70 cm Ao Asc diam:  3.00 cm MITRAL VALVE               TRICUSPID VALVE MV Area (PHT): 4.49 cm    TR Peak grad:   16.2 mmHg MV Decel Time: 169 msec    TR Vmax:        201.00 cm/s MV E velocity: 75.50 cm/s MV A velocity: 88.05 cm/s  SHUNTS MV E/A ratio:  0.86        Systemic VTI: 0.15 m Kathlyn Sacramento MD Electronically signed by Kathlyn Sacramento MD Signature Date/Time: 05/11/2022/12:08:53 PM    Final    DG Chest Port 1 View  Result Date: 05/10/2022 CLINICAL DATA:  Central line placement EXAM: PORTABLE CHEST 1 VIEW COMPARISON:  05/10/2022 FINDINGS: Right-sided PICC line observed with tip projecting over the lower SVC. No pneumothorax or complicating feature. Nasogastric tube enters the stomach. Atherosclerotic calcification of the aortic arch. The patient is rotated to the left on today's radiograph, reducing diagnostic sensitivity and specificity. Accentuated right infrahilar opacity, while some of this may be vascular, I cannot exclude infrahilar pneumonia or atelectasis. Borderline enlargement of the cardiopericardial silhouette. Greater density of the right  chest compared the left is primarily attributable to distribution of soft tissues the chest wall. IMPRESSION: 1. Right-sided PICC line tip projects over the lower SVC. No pneumothorax. 2. Accentuated right infrahilar opacity, while some of this may be vascular, I cannot exclude infrahilar pneumonia or atelectasis. 3. Borderline enlargement of the cardiopericardial silhouette. 4.  Aortic Atherosclerosis (ICD10-I70.0). Electronically Signed   By: Van Clines M.D.   On: 05/10/2022 18:33   [place] EKG SITE RITE  Result Date: 05/10/2022 If Site Rite image not attached, placement could not be confirmed due to current cardiac rhythm.  CT ABDOMEN PELVIS WO CONTRAST  Result Date: 05/10/2022 CLINICAL DATA:  Unresponsiveness. EXAM: CT ABDOMEN AND PELVIS WITHOUT CONTRAST TECHNIQUE: Multidetector CT imaging of the abdomen and pelvis was performed following the standard protocol without IV contrast. RADIATION DOSE REDUCTION: This exam was performed according to the departmental dose-optimization program which includes automated exposure control, adjustment of the mA and/or kV according to patient size and/or use of iterative reconstruction technique. COMPARISON:  6/10/8 FINDINGS: Exam detail is diminished secondary to body habitus. Lower chest: No pleural fluid or airspace consolidation Hepatobiliary: Hepatic steatosis identified. No focal liver abnormality. Stones within the dependent portion of the gallbladder. No signs of pericholecystic inflammation. No bile duct dilatation. Pancreas: Unremarkable. No pancreatic ductal dilatation or surrounding inflammatory changes. Spleen: Normal in size without focal abnormality. Adrenals/Urinary Tract: Adrenal glands are unremarkable. Kidneys are normal, without renal calculi, focal lesion, or hydronephrosis. Bladder is unremarkable. Stomach/Bowel: Enteric tube is identified with tip body of the stomach. Stomach is normal in appearance. The appendix is visualized and appears  normal. No pathologic dilatation of the large or small bowel loops. No bowel wall thickening or inflammation. Vascular/Lymphatic: Aortic atherosclerosis. No aneurysm. No signs of abdominopelvic adenopathy. Reproductive: Multiple uterine fibroids are identified which appear calcified measuring up to 4.2 cm. No adnexal mass. Other: No free fluid or fluid collections identified within the abdomen or pelvis. No signs of pneumoperitoneum. Small soft tissue nodule within the left lower quadrant posterior to the descending colon measures 1.9 cm, image 49/2. This is of uncertain clinical significance Musculoskeletal: Laxity of the ventral abdominal wall is noted with anterior and inferior herniation bowel loops. Small fat containing umbilical hernia is noted, image 81/2. On study from  07/25/2006 there was a peritoneal cyst posterior to the descending colon. This may represent sequelae of involution of that cyst. There is a large pannus with marked diffuse skin thickening and subcutaneous soft tissue stranding. No discrete fluid collection identified within the pannus to suggest abscess. Thoracolumbar degenerative disc disease. This is most severe at the L5-S1 level. IMPRESSION: 1. No acute findings within the abdomen or pelvis. 2. Large pannus with marked diffuse skin thickening and subcutaneous soft tissue stranding. Correlate for any clinical signs or symptoms of cellulitis. No discrete fluid collection identified within the pannus to suggest abscess. 3. Hepatic steatosis. 4. Gallstones. 5. Uterine fibroids. 6. Small fat containing umbilical hernia. 7. Laxity of the ventral abdominal wall with anterior and inferior herniation bowel loops. 8. Aortic Atherosclerosis (ICD10-I70.0). Electronically Signed   By: Kerby Moors M.D.   On: 05/10/2022 13:02   DG Abd Portable 1V  Result Date: 05/10/2022 CLINICAL DATA:  NG tube placement EXAM: PORTABLE ABDOMEN - 1 VIEW COMPARISON:  None Available. FINDINGS: NG tube tip and side  port are in the stomach. Visualized bowel gas pattern is normal. Atelectasis of the partially visualized left lung base. IMPRESSION: NG tube tip and side port are in the stomach. Electronically Signed   By: Yetta Glassman M.D.   On: 05/10/2022 11:08   CT HEAD WO CONTRAST (5MM)  Result Date: 05/10/2022 CLINICAL DATA:  Mental status change with unknown cause. EXAM: CT HEAD WITHOUT CONTRAST TECHNIQUE: Contiguous axial images were obtained from the base of the skull through the vertex without intravenous contrast. RADIATION DOSE REDUCTION: This exam was performed according to the departmental dose-optimization program which includes automated exposure control, adjustment of the mA and/or kV according to patient size and/or use of iterative reconstruction technique. COMPARISON:  None Available. FINDINGS: Brain: No evidence of acute infarction, hemorrhage, hydrocephalus, extra-axial collection or mass lesion/mass effect. Significant motion artifact. Vascular: No gross hyperdense vessel or unexpected calcification. Skull: Unremarkable Sinuses/Orbits: No acute finding IMPRESSION: Moderate motion degradation.  No acute finding. Electronically Signed   By: Jorje Guild M.D.   On: 05/10/2022 06:20   DG Chest Port 1 View  Result Date: 05/10/2022 CLINICAL DATA:  Questionable sepsis EXAM: PORTABLE CHEST 1 VIEW COMPARISON:  02/07/2018 FINDINGS: Generalized interstitial thickening. Borderline heart size accentuated by leftward rotation. There could be left pleural fluid that is small volume. No pneumothorax or air bronchogram. IMPRESSION: Generalized interstitial opacity favoring edema. Electronically Signed   By: Jorje Guild M.D.   On: 05/10/2022 04:29    Microbiology: Results for orders placed or performed during the hospital encounter of 05/10/22  Resp panel by RT-PCR (RSV, Flu A&B, Covid) Anterior Nasal Swab     Status: None   Collection Time: 05/10/22  3:27 AM   Specimen: Anterior Nasal Swab  Result  Value Ref Range Status   SARS Coronavirus 2 by RT PCR NEGATIVE NEGATIVE Final    Comment: (NOTE) SARS-CoV-2 target nucleic acids are NOT DETECTED.  The SARS-CoV-2 RNA is generally detectable in upper respiratory specimens during the acute phase of infection. The lowest concentration of SARS-CoV-2 viral copies this assay can detect is 138 copies/mL. A negative result does not preclude SARS-Cov-2 infection and should not be used as the sole basis for treatment or other patient management decisions. A negative result may occur with  improper specimen collection/handling, submission of specimen other than nasopharyngeal swab, presence of viral mutation(s) within the areas targeted by this assay, and inadequate number of viral copies(<138 copies/mL). A negative result  must be combined with clinical observations, patient history, and epidemiological information. The expected result is Negative.  Fact Sheet for Patients:  EntrepreneurPulse.com.au  Fact Sheet for Healthcare Providers:  IncredibleEmployment.be  This test is no t yet approved or cleared by the [place] FDA and  has been authorized for detection and/or diagnosis of SARS-CoV-2 by FDA under an Emergency Use Authorization (EUA). This EUA will remain  in effect (meaning this test can be used) for the duration of the COVID-19 declaration under Section 564(b)(1) of the Act, 21 U.S.C.section 360bbb-3(b)(1), unless the authorization is terminated  or revoked sooner.       Influenza A by PCR NEGATIVE NEGATIVE Final   Influenza B by PCR NEGATIVE NEGATIVE Final    Comment: (NOTE) The Xpert Xpress SARS-CoV-2/FLU/RSV plus assay is intended as an aid in the diagnosis of influenza from Nasopharyngeal swab specimens and should not be used as a sole basis for treatment. Nasal washings and aspirates are unacceptable for Xpert Xpress SARS-CoV-2/FLU/RSV testing.  Fact Sheet for  Patients: EntrepreneurPulse.com.au  Fact Sheet for Healthcare Providers: IncredibleEmployment.be  This test is not yet approved or cleared by the [place] FDA and has been authorized for detection and/or diagnosis of SARS-CoV-2 by FDA under an Emergency Use Authorization (EUA). This EUA will remain in effect (meaning this test can be used) for the duration of the COVID-19 declaration under Section 564(b)(1) of the Act, 21 U.S.C. section 360bbb-3(b)(1), unless the authorization is terminated or revoked.     Resp Syncytial Virus by PCR NEGATIVE NEGATIVE Final    Comment: (NOTE) Fact Sheet for Patients: EntrepreneurPulse.com.au  Fact Sheet for Healthcare Providers: IncredibleEmployment.be  This test is not yet approved or cleared by the [place] FDA and has been authorized for detection and/or diagnosis of SARS-CoV-2 by FDA under an Emergency Use Authorization (EUA). This EUA will remain in effect (meaning this test can be used) for the duration of the COVID-19 declaration under Section 564(b)(1) of the Act, 21 U.S.C. section 360bbb-3(b)(1), unless the authorization is terminated or revoked.  Performed at [facility], [address] [place], [place] [address]   Blood Culture (routine x 2)     Status: None   Collection Time: 05/10/22  3:28 AM   Specimen: BLOOD  Result Value Ref Range Status   Specimen Description BLOOD  LEFT AC  Final   Special Requests   Final    BOTTLES DRAWN AEROBIC AND ANAEROBIC Blood Culture results may not be optimal due to an excessive volume of blood received in culture bottles   Culture   Final    NO GROWTH 5 DAYS Performed at [facility], [address] [place], [place] [address]    Report Status 05/15/2022 FINAL  Final  Blood Culture (routine x 2)     Status: None   Collection Time: 05/10/22  3:35 AM   Specimen: BLOOD  Result Value Ref Range  Status   Specimen Description BLOOD  LEFT ARM  Final   Special Requests   Final    BOTTLES DRAWN AEROBIC AND ANAEROBIC Blood Culture results may not be optimal due to an excessive volume of blood received in culture bottles   Culture   Final    NO GROWTH 5 DAYS Performed at [facility], [address], [place],  [address]    Report Status 05/15/2022 FINAL  Final  MRSA Next Gen by PCR, Nasal     Status: Abnormal   Collection Time: 05/10/22  6:37  AM   Specimen: Nasal Mucosa; Nasal Swab  Result Value Ref Range Status   MRSA by PCR Next Gen DETECTED (A) NOT DETECTED Final    Comment: RESULT CALLED TO, READ BACK BY AND VERIFIED WITH: Kennard RN [phone] 05/10/22 HNM (NOTE) The GeneXpert MRSA Assay (FDA approved for NASAL specimens only), is one component of a comprehensive MRSA colonization surveillance program. It is not intended to diagnose MRSA infection nor to guide or monitor treatment for MRSA infections. Test performance is not FDA approved in patients less than 12 years old. Performed at [facility], [place], [place] [address]   Respiratory (~20 pathogens) panel by PCR     Status: None   Collection Time: 05/10/22  8:52 AM   Specimen: Nasopharyngeal Swab; Respiratory  Result Value Ref Range Status   Adenovirus NOT DETECTED NOT DETECTED Final   Coronavirus 229E NOT DETECTED NOT DETECTED Final    Comment: (NOTE) The Coronavirus on the Respiratory Panel, DOES NOT test for the novel  Coronavirus (2019 nCoV)    Coronavirus HKU1 NOT DETECTED NOT DETECTED Final   Coronavirus NL63 NOT DETECTED NOT DETECTED Final   Coronavirus OC43 NOT DETECTED NOT DETECTED Final   Metapneumovirus NOT DETECTED NOT DETECTED Final   Rhinovirus / Enterovirus NOT DETECTED NOT DETECTED Final   Influenza A NOT DETECTED NOT DETECTED Final   Influenza B NOT DETECTED NOT DETECTED Final   Parainfluenza Virus 1 NOT DETECTED NOT DETECTED Final   Parainfluenza Virus  2 NOT DETECTED NOT DETECTED Final   Parainfluenza Virus 3 NOT DETECTED NOT DETECTED Final   Parainfluenza Virus 4 NOT DETECTED NOT DETECTED Final   Respiratory Syncytial Virus NOT DETECTED NOT DETECTED Final   Bordetella pertussis NOT DETECTED NOT DETECTED Final   Bordetella Parapertussis NOT DETECTED NOT DETECTED Final   Chlamydophila pneumoniae NOT DETECTED NOT DETECTED Final   Mycoplasma pneumoniae NOT DETECTED NOT DETECTED Final    Comment: Performed at [facility], [place]. [address] [place], [place] [address]    Labs: CBC: Recent Labs  Lab 05/13/22 0744 05/14/22 0509 05/15/22 0637 05/16/22 0247 05/17/22 0210  WBC 10.8* 9.0 10.0 11.8* 11.3*  HGB 12.6 12.9 12.9 12.9 12.4  HCT 38.4 38.1 37.8 37.9 37.2  MCV 92.8 89.4 87.7 88.8 88.6  PLT PLATELET CLUMPS NOTED ON SMEAR, UNABLE TO ESTIMATE PLATELET CLUMPS NOTED ON SMEAR, UNABLE TO ESTIMATE 85* 96* [id number]*   Basic Metabolic Panel: Recent Labs  Lab 05/13/22 0744 05/14/22 0509 05/14/22 1833 05/15/22 0637 05/16/22 0247 05/17/22 0210  NA 137 138  --  138 135 134*  K 3.7 3.1* 3.4* 3.4* 3.5 3.3*  CL 104 100  --  105 103 101  CO2 25 23  --  25 25 25   GLUCOSE 115* 115*  --  109* 141* 179*  BUN 21 21  --  21 20 20   CREATININE 0.84 0.79  --  0.78 0.80 0.82  CALCIUM 7.7* 7.7*  --  7.6* 7.5* 7.7*  MG 2.4 2.4  --  2.4 2.3 2.3  PHOS 2.2* 2.8  --  3.5 3.8 3.7   Liver Function Tests: No results for input(s): "AST", "ALT", "ALKPHOS", "BILITOT", "PROT", "ALBUMIN" in the last 168 hours. CBG: Recent Labs  Lab 05/17/22 2122 05/18/22 0848 05/18/22 1150 05/18/22 1646 05/19/22 0800  GLUCAP 170* 82 100* 106* 121*    Discharge time spent: greater than 30 minutes.  Signed: Fritzi Mandes, MD [facility] 05/19/2022

## 2022-06-06 ENCOUNTER — Encounter: Payer: Self-pay | Admitting: *Deleted

## 2022-06-13 ENCOUNTER — Ambulatory Visit: Payer: Medicare HMO | Admitting: Cardiology

## 2022-06-22 ENCOUNTER — Encounter: Payer: Self-pay | Admitting: Cardiology

## 2022-06-22 ENCOUNTER — Ambulatory Visit: Payer: Medicare HMO | Attending: Cardiology | Admitting: Cardiology

## 2022-06-22 VITALS — BP 112/72 | HR 81

## 2022-06-22 DIAGNOSIS — E039 Hypothyroidism, unspecified: Secondary | ICD-10-CM

## 2022-06-22 DIAGNOSIS — Z86718 Personal history of other venous thrombosis and embolism: Secondary | ICD-10-CM

## 2022-06-22 DIAGNOSIS — I480 Paroxysmal atrial fibrillation: Secondary | ICD-10-CM

## 2022-06-22 DIAGNOSIS — Z89512 Acquired absence of left leg below knee: Secondary | ICD-10-CM

## 2022-06-22 DIAGNOSIS — I10 Essential (primary) hypertension: Secondary | ICD-10-CM

## 2022-06-22 DIAGNOSIS — E785 Hyperlipidemia, unspecified: Secondary | ICD-10-CM

## 2022-06-22 DIAGNOSIS — I5032 Chronic diastolic (congestive) heart failure: Secondary | ICD-10-CM

## 2022-06-22 NOTE — Progress Notes (Unsigned)
Cardiology Office Note:   Date:  06/23/2022  ID:  Kayla Reyes, DOB 08-24-1950, MRN [id number]  History of Present Illness:   Kayla Reyes is a 72 y.o. 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  Previous echocardiogram done in December 2019 showed an EF of 55 to 60%.  She presented to the [facility] emergency department 05/10/2022 via EMS when her family was called for being unresponsive.  Per EMS patient was oriented to person and event which is different from her baseline.  Family was at the bedside and she was typically alert and oriented.  She continues to chest pain, shortness breath, abdominal pain nausea vomiting.  She had audible wheezing and appeared mildly dyspneic but both the patient and her son reported that that was her baseline.  She also endorsed chronic productive cough.  She also been following with rheumatology and been treated for RA.  The patient and family/states she been taking all of her medications as prescribed including blood thinners.  She was started on empiric antibiotics as well as given 2 L of IV fluid and admitted to ICU to be managed by PCCM for concern for the need of vasopressor support and severe sepsis.  Initial vitals reveal blood pressure 140/88, heart rate 125, respirations 30, temperature 102.7.  Pertinent labs were sodium 138, potassium 3.2, creatinine 1.94, hemoglobin 15.2, respiratory panel was negative, BNP 43.2, lactic acid of 8.6.   Previously she had been given Lopressor bottomed out her blood pressure was currently on 2 vasopressors and was started on amiodarone drip.  She underwent DCCV 3/29 but now has reverted back to atrial fibrillation prior to discharge from the hospital.  She had spontaneous converted back to sinus rhythm on p.o. amiodarone.  She was also continue metoprolol 25 mg twice daily and continued with warfarin.  After being treated for sepsis weaned off of pressors she was considered safe for discharge and was discharged on/4/24.  She returns clinic today accompanied by her son.  She states that she has been doing fairly well since discharge from the hospital.  She denies head congestion from pollen and seasonal allergies today but denies any worsening shortness of breath, chest pain, palpitations, lightheadedness or dizziness.  She denies any bleeding or blood noted in her stool or urine.  She was discharged from the facility to finish antibiotic therapy after being admitted for sepsis due to a pannus infection.  She did require cardioversion in the hospital for atrial flutter and atrial fibrillation that was worsening her hypotension.  She has been compliant with her medication and stated that her warfarin dosing was just increased.  ROS: Point review of systems has been reviewed is considered negative with exception of what is been listed in the HPI  Studies Reviewed:    EKG: Sinus rhythm with a rate of 82 with a rightward axis, no acute change from prior study  TTE 05/10/22 1. Left ventricular ejection fraction, by estimation, is 55 to 60%. The  left ventricle has normal function. The left ventricle has no regional  wall motion abnormalities. Left ventricular diastolic parameters were  normal.   2. Right ventricular systolic function is normal. The right ventricular  size is normal. There is normal pulmonary artery systolic pressure.   3. The mitral valve is normal in structure. No evidence of mitral valve   regurgitation. No evidence of mitral stenosis.   4. The aortic valve is normal in structure. Aortic valve regurgitation is  not visualized. Aortic valve sclerosis is present, with no evidence of  aortic valve stenosis.    Risk Assessment/Calculations:    CHA2DS2-VASc Score = 5   This indicates a 7.2% annual risk of stroke. The patient's score is based upon: CHF History: 1 HTN History: 1 Diabetes History: 0 Stroke History: 0 Vascular Disease History: 1 Age Score: 1 Gender Score: 1             Physical Exam:   VS:  BP 112/72 (BP Location: Left Arm, Patient Position: Sitting, Cuff Size: Normal)   Pulse 81   SpO2 96%    Wt Readings from Last 3 Encounters:  05/19/22 (!) 373 lb 10.9 oz (169.5 kg)  02/19/18 (!) 399 lb (181 kg)  02/09/18 (!) 399 lb 3.2 oz (181.1 kg)     GEN: Well nourished, well developed in no acute distress, in wheelchair today NECK: No JVD; No carotid bruits, wheezing noted to the upper airway CARDIAC: RRR, no murmurs, distant heart sounds rubs, gallops RESPIRATORY: Diminished to auscultation without rales, wheezing or rhonchi  ABDOMEN: Soft, non-tender, non-distended EXTREMITIES:  No edema; No deformity, left BKA  ASSESSMENT AND PLAN:   Paroxysmal atrial fibrillation with recent hospitalization for A-fib RVR and a flutter requiring cardioversion in the setting of sepsis..  She denies any palpitations.  EKG today reveals sinus rhythm.  She is continued on amiodarone 200 mg daily, metoprolol titrate 25 mg twice daily, and continued on warfarin currently at 5 mg daily for CHA2DS2-VASc score of at least 5 with a PT/INR due today.  She has tolerated long-term anticoagulant with warfarin well without any incidence of bleeding.  Chronic diastolic heart failure she denies any recurrent shortness of breath today.  LVEF on last echocardiogram was 55-60%, without regional wall motion abnormalities, and no valvular abnormalities were noted.  She is continued on Jardiance  10 mg daily, furosemide 40 mg daily.  Difficult to determine volume status on exam given morbid obesity.  Essential hypertension with blood pressure 112/72.  Blood pressure remained stable.  She is continued on furosemide, metoprolol tartrate.  Encouraged to continue to monitor pressures at home 1 to 2 hours post medications.  Mixed hyperlipidemia she is continued on lovastatin 20 mg daily.  Will need lipid panel.  Hypothyroidism where she is continued on levothyroxine 137 mcg daily.  This continues to be monitored by her PCP.  History of an arterial embolism status post left BKA on long-term anticoagulation with warfarin.  Morbid obesity with the inability to be active due to previous amputation.  She is in a wheelchair today.  Disposition patient return to clinic to see MD/APP in 3 months or sooner if needed.        Signed, Sanaia Jasso, NP

## 2022-06-22 NOTE — Patient Instructions (Signed)
Medication Instructions:  Your Physician recommend you continue on your current medication as directed.    *If you need a refill on your cardiac medications before your next appointment, please call your pharmacy*   Lab Work: None ordered today   Testing/Procedures: None ordered today   Follow-Up: At [facility], you and your health needs are our priority.  As part of our continuing mission to provide you with exceptional heart care, we have created designated Provider Care Teams.  These Care Teams include your primary Cardiologist (physician) and Advanced Practice Providers (APPs -  Physician Assistants and Nurse Practitioners) who all work together to provide you with the care you need, when you need it.  We recommend signing up for the patient portal called "MyChart".  Sign up information is provided on this After Visit Summary.  MyChart is used to connect with patients for Virtual Visits (Telemedicine).  Patients are able to view lab/test results, encounter notes, upcoming appointments, etc.  Non-urgent messages can be sent to your provider as well.   To learn more about what you can do with MyChart, go to [email or web address].    Your next appointment:   3 month(s)  Provider:   Lorine Bears, MD

## 2022-06-23 ENCOUNTER — Encounter: Payer: Self-pay | Admitting: Cardiology

## 2022-08-09 ENCOUNTER — Emergency Department: Payer: Medicare HMO

## 2022-08-09 ENCOUNTER — Other Ambulatory Visit: Payer: Self-pay

## 2022-08-09 ENCOUNTER — Encounter: Payer: Self-pay | Admitting: Emergency Medicine

## 2022-08-09 ENCOUNTER — Inpatient Hospital Stay
Admission: EM | Admit: 2022-08-09 | Discharge: 2022-08-16 | DRG: 872 | Disposition: A | Discharge status: Home / Self Care | Payer: Medicare HMO | Attending: Internal Medicine | Admitting: Internal Medicine

## 2022-08-09 DIAGNOSIS — I959 Hypotension, unspecified: Secondary | ICD-10-CM | POA: Diagnosis present

## 2022-08-09 DIAGNOSIS — E1151 Type 2 diabetes mellitus with diabetic peripheral angiopathy without gangrene: Secondary | ICD-10-CM | POA: Diagnosis present

## 2022-08-09 DIAGNOSIS — Z7960 Long term (current) use of unspecified immunomodulators and immunosuppressants: Secondary | ICD-10-CM

## 2022-08-09 DIAGNOSIS — M069 Rheumatoid arthritis, unspecified: Secondary | ICD-10-CM

## 2022-08-09 DIAGNOSIS — I890 Lymphedema, not elsewhere classified: Secondary | ICD-10-CM | POA: Diagnosis present

## 2022-08-09 DIAGNOSIS — Z89512 Acquired absence of left leg below knee: Secondary | ICD-10-CM

## 2022-08-09 DIAGNOSIS — Z7984 Long term (current) use of oral hypoglycemic drugs: Secondary | ICD-10-CM

## 2022-08-09 DIAGNOSIS — A419 Sepsis, unspecified organism: Secondary | ICD-10-CM | POA: Diagnosis not present

## 2022-08-09 DIAGNOSIS — Z7901 Long term (current) use of anticoagulants: Secondary | ICD-10-CM

## 2022-08-09 DIAGNOSIS — E785 Hyperlipidemia, unspecified: Secondary | ICD-10-CM | POA: Diagnosis present

## 2022-08-09 DIAGNOSIS — E876 Hypokalemia: Secondary | ICD-10-CM | POA: Insufficient documentation

## 2022-08-09 DIAGNOSIS — Z79899 Other long term (current) drug therapy: Secondary | ICD-10-CM

## 2022-08-09 DIAGNOSIS — E872 Acidosis, unspecified: Secondary | ICD-10-CM | POA: Diagnosis present

## 2022-08-09 DIAGNOSIS — N390 Urinary tract infection, site not specified: Secondary | ICD-10-CM

## 2022-08-09 DIAGNOSIS — E039 Hypothyroidism, unspecified: Secondary | ICD-10-CM | POA: Diagnosis present

## 2022-08-09 DIAGNOSIS — I480 Paroxysmal atrial fibrillation: Secondary | ICD-10-CM | POA: Diagnosis present

## 2022-08-09 DIAGNOSIS — Z89612 Acquired absence of left leg above knee: Secondary | ICD-10-CM

## 2022-08-09 DIAGNOSIS — M793 Panniculitis, unspecified: Secondary | ICD-10-CM | POA: Diagnosis present

## 2022-08-09 DIAGNOSIS — Z86718 Personal history of other venous thrombosis and embolism: Secondary | ICD-10-CM

## 2022-08-09 DIAGNOSIS — B961 Klebsiella pneumoniae [K. pneumoniae] as the cause of diseases classified elsewhere: Secondary | ICD-10-CM | POA: Diagnosis present

## 2022-08-09 DIAGNOSIS — R1110 Vomiting, unspecified: Secondary | ICD-10-CM | POA: Diagnosis present

## 2022-08-09 DIAGNOSIS — Z8261 Family history of arthritis: Secondary | ICD-10-CM

## 2022-08-09 DIAGNOSIS — K5900 Constipation, unspecified: Secondary | ICD-10-CM | POA: Diagnosis present

## 2022-08-09 DIAGNOSIS — I10 Essential (primary) hypertension: Secondary | ICD-10-CM | POA: Diagnosis present

## 2022-08-09 DIAGNOSIS — Z8249 Family history of ischemic heart disease and other diseases of the circulatory system: Secondary | ICD-10-CM

## 2022-08-09 DIAGNOSIS — L03311 Cellulitis of abdominal wall: Secondary | ICD-10-CM | POA: Diagnosis present

## 2022-08-09 DIAGNOSIS — Z87891 Personal history of nicotine dependence: Secondary | ICD-10-CM

## 2022-08-09 DIAGNOSIS — D84821 Immunodeficiency due to drugs: Secondary | ICD-10-CM

## 2022-08-09 DIAGNOSIS — Z9189 Other specified personal risk factors, not elsewhere classified: Secondary | ICD-10-CM

## 2022-08-09 DIAGNOSIS — Z8349 Family history of other endocrine, nutritional and metabolic diseases: Secondary | ICD-10-CM

## 2022-08-09 DIAGNOSIS — Z6841 Body Mass Index (BMI) 40.0 and over, adult: Secondary | ICD-10-CM

## 2022-08-09 DIAGNOSIS — R6520 Severe sepsis without septic shock: Secondary | ICD-10-CM | POA: Diagnosis present

## 2022-08-09 HISTORY — DX: Type 2 diabetes mellitus without complications: E11.9

## 2022-08-09 LAB — CBC WITH DIFFERENTIAL/PLATELET
Abs Immature Granulocytes: 0.02 10*3/uL (ref 0.00–0.07)
Basophils Absolute: 0 10*3/uL (ref 0.0–0.1)
Basophils Relative: 0 %
Eosinophils Absolute: 0 10*3/uL (ref 0.0–0.5)
Eosinophils Relative: 0 %
HCT: 40.5 % (ref 36.0–46.0)
Hemoglobin: 13 g/dL (ref 12.0–15.0)
Immature Granulocytes: 0 %
Lymphocytes Relative: 11 %
Lymphs Abs: 0.8 10*3/uL (ref 0.7–4.0)
MCH: 30.6 pg (ref 26.0–34.0)
MCHC: 32.1 g/dL (ref 30.0–36.0)
MCV: 95.3 fL (ref 80.0–100.0)
Monocytes Absolute: 0.1 10*3/uL (ref 0.1–1.0)
Monocytes Relative: 1 %
Neutro Abs: 6.7 10*3/uL (ref 1.7–7.7)
Neutrophils Relative %: 88 %
Platelets: 230 10*3/uL (ref 150–400)
RBC: 4.25 MIL/uL (ref 3.87–5.11)
RDW: 15.7 % — ABNORMAL HIGH (ref 11.5–15.5)
Smear Review: UNDETERMINED
WBC: 7.7 10*3/uL (ref 4.0–10.5)
nRBC: 0 % (ref 0.0–0.2)

## 2022-08-09 LAB — COMPREHENSIVE METABOLIC PANEL
ALT: 12 U/L (ref 0–44)
AST: 15 U/L (ref 15–41)
Albumin: 2.9 g/dL — ABNORMAL LOW (ref 3.5–5.0)
Alkaline Phosphatase: 35 U/L — ABNORMAL LOW (ref 38–126)
Anion gap: 11 (ref 5–15)
BUN: 15 mg/dL (ref 8–23)
CO2: 23 mmol/L (ref 22–32)
Calcium: 8.3 mg/dL — ABNORMAL LOW (ref 8.9–10.3)
Chloride: 100 mmol/L (ref 98–111)
Creatinine, Ser: 0.99 mg/dL (ref 0.44–1.00)
GFR, Estimated: >60 mL/min (ref >60)
Glucose, Bld: 144 mg/dL — ABNORMAL HIGH (ref 70–99)
Potassium: 3.5 mmol/L (ref 3.5–5.1)
Sodium: 134 mmol/L — ABNORMAL LOW (ref 135–145)
Total Bilirubin: 1 mg/dL (ref 0.3–1.2)
Total Protein: 6.7 g/dL (ref 6.5–8.1)

## 2022-08-09 LAB — LIPASE, BLOOD: Lipase: 32 U/L (ref 11–51)

## 2022-08-09 MED ORDER — LACTATED RINGERS IV BOLUS
500.0000 mL | Freq: Once | INTRAVENOUS | Status: AC
  Administered 2022-08-10: 500 mL via INTRAVENOUS

## 2022-08-09 NOTE — ED Provider Notes (Signed)
[facility] Provider Note    Event Date/Time   First MD Initiated Contact with Patient 08/09/22 2305     (approximate)   History   Fever   HPI  Kayla Reyes is a 72 y.o. female on review of previous discharge from April of this year patient has a history of fever acute kidney injury hypotension A-fib RVR and sepsis.  Patient was noted at that time to have an infection underneath her pannus.  History of broad-spectrum antibiotic   She has been noticing fever for about the last 2 days.  Feeling warm and also has noticed that she is having slight discharge and a little bit of increased warmth and irritation along her skin rolls on the abdomen  Reports similar symptoms when she developed sepsis once  No abdominal pain but reports when she tried to eat this morning she did not feel well and vomited.  She had no further vomiting.  No abdominal pain no chest pain or trouble breathing.  Physical Exam   Triage Vital Signs: ED Triage Vitals  Enc Vitals Group     BP 08/09/22 1815 (!) 114/55     Pulse Rate 08/09/22 1815 96     Resp 08/09/22 1815 18     Temp 08/09/22 1815 100.3 F (37.9 C)     Temp Source 08/09/22 1815 Oral     SpO2 08/09/22 1815 100 %     Weight --      Height --      Head Circumference --      Peak Flow --      Pain Score 08/09/22 1814 0     Pain Loc --      Pain Edu? --      Excl. in GC? --     Most recent vital signs: Vitals:   08/10/22 0141 08/10/22 0200  BP:  (!) 102/50  Pulse:    Resp:  20  Temp: 100.3 F (37.9 C)   SpO2:       General: Awake, no distress.  CV:  Good peripheral perfusion.  Heart tones somewhat distant, regular Resp:  Normal effort.  Clear bilaterally.  Distant. Abd:  No distention.  Very large pannus.  Abdomen soft nontender nondistended.  Patient's son and I lifted her pannus, and she has slight thickened discharge along many of the folds and along the left upper portion of the pannus a slight amount  of purulent discharge with slight warmth of skin in the edges of the pannus.  Warm to touch Other:  Left lower extremity previous amputation   ED Results / Procedures / Treatments   Labs (all labs ordered are listed, but only abnormal results are displayed) Labs Reviewed  COMPREHENSIVE METABOLIC PANEL - Abnormal; Notable for the following components:      Result Value   Sodium 134 (*)    Glucose, Bld 144 (*)    Calcium 8.3 (*)    Albumin 2.9 (*)    Alkaline Phosphatase 35 (*)    All other components within normal limits  CBC WITH DIFFERENTIAL/PLATELET - Abnormal; Notable for the following components:   RDW 15.7 (*)    All other components within normal limits  PROTIME-INR - Abnormal; Notable for the following components:   Prothrombin Time 20.1 (*)    INR 1.7 (*)    All other components within normal limits  LACTIC ACID, PLASMA - Abnormal; Notable for the following components:   Lactic Acid, Venous 2.5 (*)  All other components within normal limits  URINALYSIS, W/ REFLEX TO CULTURE (INFECTION SUSPECTED) - Abnormal; Notable for the following components:   Color, Urine YELLOW (*)    APPearance HAZY (*)    Glucose, UA >=500 (*)    Ketones, ur 5 (*)    Leukocytes,Ua MODERATE (*)    Bacteria, UA MANY (*)    All other components within normal limits  CULTURE, BLOOD (ROUTINE X 2)  CULTURE, BLOOD (ROUTINE X 2)  URINE CULTURE  LIPASE, BLOOD  LACTIC ACID, PLASMA  PROTIME-INR  CORTISOL-AM, BLOOD  PROCALCITONIN  BASIC METABOLIC PANEL  CBC      RADIOLOGY  Due to imaging not crossing over in our system properly I was not able to personally interpret patient's chest x-ray but radiologist report is reviewed   DG Chest 1 View  Result Date: 08/09/2022 CLINICAL DATA:  Fever and vomiting. EXAM: CHEST  1 VIEW COMPARISON:  May 12, 2022 FINDINGS: The cardiac silhouette is mildly enlarged and unchanged in size. There is marked severity calcification of the aortic arch. Stable,  mild to moderate severity diffusely increased interstitial lung markings are seen with mild prominence of the pulmonary vasculature. Mild atelectasis is noted within the right lung base. No pleural effusion or pneumothorax is identified. Multilevel degenerative changes seen throughout the thoracic spine. IMPRESSION: Stable cardiomegaly with chronic pulmonary vascular congestion and mild right basilar atelectasis. Electronically Signed   By: Aram Candela M.D.   On: 08/09/2022 23:41      PROCEDURES:  Critical Care performed: No  Procedures   MEDICATIONS ORDERED IN ED: Medications  vancomycin (VANCOREADY) IVPB 2000 mg/400 mL (2,000 mg Intravenous New Bag/Given 08/10/22 0122)  amiodarone (PACERONE) tablet 200 mg (has no administration in time range)  pravastatin (PRAVACHOL) tablet 10 mg (has no administration in time range)  levothyroxine (SYNTHROID) tablet 150 mcg (has no administration in time range)  albuterol (PROVENTIL) (2.5 MG/3ML) 0.083% nebulizer solution 2.5 mg (has no administration in time range)  leptospermum manuka honey (MEDIHONEY) paste 1 Application (has no administration in time range)  lactated ringers infusion (has no administration in time range)  metroNIDAZOLE (FLAGYL) IVPB 500 mg (0 mg Intravenous Stopped 08/10/22 0240)  acetaminophen (TYLENOL) tablet 650 mg (has no administration in time range)    Or  acetaminophen (TYLENOL) suppository 650 mg (has no administration in time range)  HYDROcodone-acetaminophen (NORCO/VICODIN) 5-325 MG per tablet 1-2 tablet (has no administration in time range)  ondansetron (ZOFRAN) tablet 4 mg (has no administration in time range)    Or  ondansetron (ZOFRAN) injection 4 mg (has no administration in time range)  ceFEPIme (MAXIPIME) 2 g in sodium chloride 0.9 % 100 mL IVPB (has no administration in time range)  vancomycin (VANCOREADY) IVPB 500 mg/100 mL (has no administration in time range)  vancomycin (VANCOREADY) IVPB 2000 mg/400 mL  (has no administration in time range)  lactated ringers bolus 500 mL (0 mLs Intravenous Stopped 08/10/22 0142)  cefTRIAXone (ROCEPHIN) 2 g in sodium chloride 0.9 % 100 mL IVPB (0 g Intravenous Stopped 08/10/22 0141)  lactated ringers bolus 1,000 mL (1,000 mLs Intravenous New Bag/Given 08/10/22 0131)     IMPRESSION / MDM / ASSESSMENT AND PLAN / ED COURSE  I reviewed the triage vital signs and the nursing notes.                              Differential diagnosis includes, but is not limited to, infection of  the pannus, other cause for infection including urinary tract intra-abdominal, pulmonary, etc. are considered.  Patient denies acute abdominal pain reports she felt nauseated vomited this morning but no associate abdominal symptoms otherwise.  No chest pain no shortness of breath no cough.  She reports warmth in the pannus and discharge from the pannus.  Left upper edge.  There is no area of necrosis or obvious area of abscess formation.  It is notable that she has had sepsis from infection of the pannus in the past.  Urinalysis pending she lacks urinary symptoms  Reviewed some of her notes from previous admission, she was initially treated with vancomycin and cefepime but apparently worsened and had her antibiotics changed and broadened.  I do not see a particular organism identified.  I have placed a consult to our hospitalist for consideration for admission and also discussed antibiotic regimen given the complexity of her previous infection of the pannus.  Patient's presentation is most consistent with acute complicated illness / injury requiring diagnostic workup.   The patient is on the cardiac monitor to evaluate for evidence of arrhythmia and/or significant heart rate changes.   Clinical Course as of 08/10/22 0316  Tue Aug 09, 2022  2308 Labs reviewed remarkable for normal CBC.  Metabolic panel with without acute abnormality.  Very minimal hyponatremia.  AST ALT are normal. [MQ]     Clinical Course User Index [MQ] Sharyn Creamer, MD   INr 1.7 on coumadin  Discussed case and clinical history and patient accepted to hospitalist by Dr. Para March  After discussing, reviewing previous clinical history, Dr. Para March recommends we start with treatment with vancomycin and ceftriaxone at this point.  Labs have been ordered.  Additionally, the patient meets sepsis criteria with fever as noted by EMS and heart rate greater than 90.  She does not have any evidence to support severe sepsis or septic shock at this time.  Lactic acid less than 4 blood pressure greater than 90.  FINAL CLINICAL IMPRESSION(S) / ED DIAGNOSES   Final diagnoses:  Cellulitis of abdominal wall  Urinary tract infection, acute  Sepsis   Rx / DC Orders   ED Discharge Orders     None        Note:  This document was prepared using Dragon voice recognition software and may include unintentional dictation errors.   Sharyn Creamer, MD 08/10/22 [phone]

## 2022-08-09 NOTE — ED Triage Notes (Signed)
Patient to ED via [facility] from home for fever that started a couple days ago. Patient states she also starting vomiting earlier today. NAD noted at this time. Temperature 101.6 with EMS- given 60mg  tylenol with EMS.   EMS VS: 101.6 101 HR 172 cbg 131/80 94% RA

## 2022-08-10 ENCOUNTER — Encounter: Payer: Self-pay | Admitting: Internal Medicine

## 2022-08-10 DIAGNOSIS — L03311 Cellulitis of abdominal wall: Secondary | ICD-10-CM

## 2022-08-10 DIAGNOSIS — Z89612 Acquired absence of left leg above knee: Secondary | ICD-10-CM | POA: Diagnosis not present

## 2022-08-10 DIAGNOSIS — M069 Rheumatoid arthritis, unspecified: Secondary | ICD-10-CM

## 2022-08-10 DIAGNOSIS — D84821 Immunodeficiency due to drugs: Secondary | ICD-10-CM | POA: Diagnosis present

## 2022-08-10 DIAGNOSIS — B961 Klebsiella pneumoniae [K. pneumoniae] as the cause of diseases classified elsewhere: Secondary | ICD-10-CM | POA: Diagnosis present

## 2022-08-10 DIAGNOSIS — Z89512 Acquired absence of left leg below knee: Secondary | ICD-10-CM | POA: Diagnosis not present

## 2022-08-10 DIAGNOSIS — E785 Hyperlipidemia, unspecified: Secondary | ICD-10-CM | POA: Diagnosis present

## 2022-08-10 DIAGNOSIS — Z87891 Personal history of nicotine dependence: Secondary | ICD-10-CM | POA: Diagnosis not present

## 2022-08-10 DIAGNOSIS — M793 Panniculitis, unspecified: Secondary | ICD-10-CM | POA: Diagnosis not present

## 2022-08-10 DIAGNOSIS — I480 Paroxysmal atrial fibrillation: Secondary | ICD-10-CM | POA: Diagnosis present

## 2022-08-10 DIAGNOSIS — I10 Essential (primary) hypertension: Secondary | ICD-10-CM | POA: Diagnosis present

## 2022-08-10 DIAGNOSIS — Z6841 Body Mass Index (BMI) 40.0 and over, adult: Secondary | ICD-10-CM | POA: Diagnosis not present

## 2022-08-10 DIAGNOSIS — E1151 Type 2 diabetes mellitus with diabetic peripheral angiopathy without gangrene: Secondary | ICD-10-CM | POA: Diagnosis present

## 2022-08-10 DIAGNOSIS — Z7901 Long term (current) use of anticoagulants: Secondary | ICD-10-CM | POA: Diagnosis not present

## 2022-08-10 DIAGNOSIS — Z79899 Other long term (current) drug therapy: Secondary | ICD-10-CM | POA: Diagnosis not present

## 2022-08-10 DIAGNOSIS — E872 Acidosis, unspecified: Secondary | ICD-10-CM | POA: Diagnosis present

## 2022-08-10 DIAGNOSIS — Z9189 Other specified personal risk factors, not elsewhere classified: Secondary | ICD-10-CM | POA: Diagnosis not present

## 2022-08-10 DIAGNOSIS — Z8249 Family history of ischemic heart disease and other diseases of the circulatory system: Secondary | ICD-10-CM | POA: Diagnosis not present

## 2022-08-10 DIAGNOSIS — N390 Urinary tract infection, site not specified: Secondary | ICD-10-CM

## 2022-08-10 DIAGNOSIS — I959 Hypotension, unspecified: Secondary | ICD-10-CM | POA: Diagnosis present

## 2022-08-10 DIAGNOSIS — E876 Hypokalemia: Secondary | ICD-10-CM | POA: Diagnosis present

## 2022-08-10 DIAGNOSIS — R6520 Severe sepsis without septic shock: Secondary | ICD-10-CM | POA: Diagnosis present

## 2022-08-10 DIAGNOSIS — E039 Hypothyroidism, unspecified: Secondary | ICD-10-CM | POA: Diagnosis present

## 2022-08-10 DIAGNOSIS — A498 Other bacterial infections of unspecified site: Secondary | ICD-10-CM | POA: Diagnosis not present

## 2022-08-10 DIAGNOSIS — Z7984 Long term (current) use of oral hypoglycemic drugs: Secondary | ICD-10-CM | POA: Diagnosis not present

## 2022-08-10 DIAGNOSIS — A419 Sepsis, unspecified organism: Secondary | ICD-10-CM | POA: Diagnosis present

## 2022-08-10 LAB — CBC
HCT: 37.8 % (ref 36.0–46.0)
Hemoglobin: 12 g/dL (ref 12.0–15.0)
MCH: 30.2 pg (ref 26.0–34.0)
MCHC: 31.7 g/dL (ref 30.0–36.0)
MCV: 95 fL (ref 80.0–100.0)
Platelets: 211 10*3/uL (ref 150–400)
RBC: 3.98 MIL/uL (ref 3.87–5.11)
RDW: 15.6 % — ABNORMAL HIGH (ref 11.5–15.5)
WBC: 7.4 10*3/uL (ref 4.0–10.5)
nRBC: 0 % (ref 0.0–0.2)

## 2022-08-10 LAB — URINALYSIS, W/ REFLEX TO CULTURE (INFECTION SUSPECTED)
Bilirubin Urine: NEGATIVE
Glucose, UA: >=500 mg/dL — AB
Hgb urine dipstick: NEGATIVE
Ketones, ur: 5 mg/dL — AB
Nitrite: NEGATIVE
Protein, ur: NEGATIVE mg/dL
Specific Gravity, Urine: 1.023 (ref 1.005–1.030)
WBC, UA: >50 WBC/hpf (ref 0–5)
pH: 5 (ref 5.0–8.0)

## 2022-08-10 LAB — BASIC METABOLIC PANEL
Anion gap: 9 (ref 5–15)
BUN: 14 mg/dL (ref 8–23)
CO2: 25 mmol/L (ref 22–32)
Calcium: 8.3 mg/dL — ABNORMAL LOW (ref 8.9–10.3)
Chloride: 104 mmol/L (ref 98–111)
Creatinine, Ser: 1.08 mg/dL — ABNORMAL HIGH (ref 0.44–1.00)
GFR, Estimated: 55 mL/min — ABNORMAL LOW (ref >60)
Glucose, Bld: 112 mg/dL — ABNORMAL HIGH (ref 70–99)
Potassium: 3.6 mmol/L (ref 3.5–5.1)
Sodium: 138 mmol/L (ref 135–145)

## 2022-08-10 LAB — LACTIC ACID, PLASMA
Lactic Acid, Venous: 1.8 mmol/L (ref 0.5–1.9)
Lactic Acid, Venous: 2.5 mmol/L (ref 0.5–1.9)

## 2022-08-10 LAB — PROTIME-INR
INR: 1.7 — ABNORMAL HIGH (ref 0.8–1.2)
INR: 1.9 — ABNORMAL HIGH (ref 0.8–1.2)
Prothrombin Time: 20.1 seconds — ABNORMAL HIGH (ref 11.4–15.2)
Prothrombin Time: 21.7 seconds — ABNORMAL HIGH (ref 11.4–15.2)

## 2022-08-10 LAB — CORTISOL-AM, BLOOD: Cortisol - AM: 21.3 ug/dL (ref 6.7–22.6)

## 2022-08-10 LAB — PROCALCITONIN: Procalcitonin: 2.88 ng/mL

## 2022-08-10 MED ORDER — LACTATED RINGERS IV SOLN
150.0000 mL/h | INTRAVENOUS | Status: AC
  Administered 2022-08-10 (×2): 150 mL/h via INTRAVENOUS

## 2022-08-10 MED ORDER — HYDROCODONE-ACETAMINOPHEN 5-325 MG PO TABS: 1.0000 | ORAL_TABLET | ORAL | Status: DC | PRN

## 2022-08-10 MED ORDER — ACETAMINOPHEN 650 MG RE SUPP: 650.0000 mg | Freq: Four times a day (QID) | RECTAL | Status: DC | PRN

## 2022-08-10 MED ORDER — VANCOMYCIN HCL 500 MG/100ML IV SOLN
500.0000 mg | Freq: Once | INTRAVENOUS | Status: AC
  Administered 2022-08-10: 500 mg via INTRAVENOUS
  Filled 2022-08-10: qty 100

## 2022-08-10 MED ORDER — ONDANSETRON HCL 4 MG PO TABS
4.0000 mg | ORAL_TABLET | Freq: Four times a day (QID) | ORAL | Status: DC | PRN
  Administered 2022-08-11: 4 mg via ORAL
  Filled 2022-08-10: qty 1

## 2022-08-10 MED ORDER — ACETAMINOPHEN 325 MG PO TABS: 650.0000 mg | ORAL_TABLET | Freq: Four times a day (QID) | ORAL | Status: DC | PRN

## 2022-08-10 MED ORDER — AMIODARONE HCL 200 MG PO TABS
200.0000 mg | ORAL_TABLET | Freq: Every day | ORAL | Status: DC
  Administered 2022-08-11 – 2022-08-16 (×6): 200 mg via ORAL
  Filled 2022-08-10 (×7): qty 1

## 2022-08-10 MED ORDER — WARFARIN - PHARMACIST DOSING INPATIENT
Freq: Every day | Status: DC
  Filled 2022-08-10: qty 1

## 2022-08-10 MED ORDER — ALBUTEROL SULFATE (2.5 MG/3ML) 0.083% IN NEBU
2.5000 mg | INHALATION_SOLUTION | Freq: Four times a day (QID) | RESPIRATORY_TRACT | Status: DC | PRN
  Administered 2022-08-10 – 2022-08-15 (×3): 2.5 mg via RESPIRATORY_TRACT
  Filled 2022-08-10 (×3): qty 3

## 2022-08-10 MED ORDER — VANCOMYCIN HCL 2000 MG/400ML IV SOLN
2000.0000 mg | Freq: Once | INTRAVENOUS | Status: AC
  Administered 2022-08-10: 2000 mg via INTRAVENOUS
  Filled 2022-08-10: qty 400

## 2022-08-10 MED ORDER — LEVOTHYROXINE SODIUM 150 MCG PO TABS
150.0000 ug | ORAL_TABLET | Freq: Every day | ORAL | Status: DC
  Administered 2022-08-10 – 2022-08-16 (×7): 150 ug via ORAL
  Filled 2022-08-10 (×3): qty 1
  Filled 2022-08-10: qty 3
  Filled 2022-08-10: qty 1
  Filled 2022-08-10 (×2): qty 3
  Filled 2022-08-10: qty 1
  Filled 2022-08-10 (×2): qty 3
  Filled 2022-08-10 (×2): qty 1
  Filled 2022-08-10: qty 3

## 2022-08-10 MED ORDER — METRONIDAZOLE 500 MG/100ML IV SOLN
500.0000 mg | Freq: Two times a day (BID) | INTRAVENOUS | Status: DC
  Administered 2022-08-10 – 2022-08-16 (×13): 500 mg via INTRAVENOUS
  Filled 2022-08-10 (×15): qty 100

## 2022-08-10 MED ORDER — WARFARIN SODIUM 10 MG PO TABS
10.0000 mg | ORAL_TABLET | Freq: Once | ORAL | Status: AC
  Administered 2022-08-10: 10 mg via ORAL
  Filled 2022-08-10: qty 1

## 2022-08-10 MED ORDER — ONDANSETRON HCL 4 MG/2ML IJ SOLN: 4.0000 mg | Freq: Four times a day (QID) | INTRAMUSCULAR | Status: DC | PRN

## 2022-08-10 MED ORDER — SODIUM CHLORIDE 0.9 % IV SOLN
2.0000 g | Freq: Once | INTRAVENOUS | Status: AC
  Administered 2022-08-10: 2 g via INTRAVENOUS
  Filled 2022-08-10: qty 20

## 2022-08-10 MED ORDER — LACTATED RINGERS IV BOLUS (SEPSIS)
1000.0000 mL | Freq: Once | INTRAVENOUS | Status: AC
  Administered 2022-08-10: 1000 mL via INTRAVENOUS

## 2022-08-10 MED ORDER — PRAVASTATIN SODIUM 20 MG PO TABS
10.0000 mg | ORAL_TABLET | Freq: Every day | ORAL | Status: DC
  Administered 2022-08-10 – 2022-08-15 (×6): 10 mg via ORAL
  Filled 2022-08-10 (×6): qty 1

## 2022-08-10 MED ORDER — MEDIHONEY WOUND/BURN DRESSING EX PSTE
1.0000 | PASTE | Freq: Every day | CUTANEOUS | Status: DC
  Administered 2022-08-10 – 2022-08-13 (×4): 1 via TOPICAL
  Filled 2022-08-10 (×2): qty 44

## 2022-08-10 MED ORDER — VANCOMYCIN HCL 2000 MG/400ML IV SOLN
2000.0000 mg | INTRAVENOUS | Status: DC
  Administered 2022-08-11: 2000 mg via INTRAVENOUS
  Filled 2022-08-10: qty 400

## 2022-08-10 MED ORDER — SODIUM CHLORIDE 0.9 % IV SOLN
2.0000 g | Freq: Three times a day (TID) | INTRAVENOUS | Status: DC
  Administered 2022-08-10 – 2022-08-16 (×19): 2 g via INTRAVENOUS
  Filled 2022-08-10 (×20): qty 12.5

## 2022-08-10 NOTE — Assessment & Plan Note (Addendum)
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  WBC normal but could be related to chronic immunosuppression Cefepime and vancomycin Sepsis fluid bolus and fluid maintenance per sepsis protocol Morbid obesity with chronic immunosuppression present complicating factors to overall prognosis and care Close monitoring in PCU in view of history of septic shock from same source

## 2022-08-10 NOTE — Assessment & Plan Note (Signed)
Essential hypertension Likely secondary to sepsis/severe sepsis Hold all antihypertensives and monitor

## 2022-08-10 NOTE — Consult Note (Signed)
ANTICOAGULATION CONSULT NOTE - Initial Consult  Pharmacy Consult for warfarin dosing Indication: atrial fibrillation  No Known Allergies  Patient Measurements: Height: 5\' 8"  (172.7 cm) Weight: (!) 170.1 kg (375 lb) IBW/kg (Calculated) : 63.9   Vital Signs: Temp: 100.3 F (37.9 C) (06/25 1815) Temp Source: Oral (06/25 1815) BP: 109/52 (06/26 0000) Pulse Rate: 96 (06/25 1815)  Labs: Recent Labs    08/09/22 1818 08/09/22 2344  HGB 13.0  --   HCT 40.5  --   PLT 230  --   LABPROT  --  20.1*  INR  --  1.7*  CREATININE 0.99  --     Estimated Creatinine Clearance: 88.8 mL/min (by C-G formula based on SCr of 0.99 mg/dL).   Medical History: Past Medical History:  Diagnosis Date   Arterial thrombosis (HCC)    a. s/p thromboectomy and thrombolysis with left BKA in 2008 on chronic Coumadin    HLD (hyperlipidemia)    Hypertension    Hypothyroidism    Obesity    PAF (paroxysmal atrial fibrillation) (HCC)    a. noted 12/19 in the setting of hypokalemia; b. CHADS2VASc 4 (HTN, age x 1, vascular disease, female); c. on chronic Coumadin in the setting of prior arterial clot   Rheumatoid arthritis (HCC)     Medications:  Per nurse patient reports take Warfarin 5 mg alternating with 10 mg every other day.  Last dose was 5 mg 6/25 @ 1600.  Assessment: 72 yo female presents to ED due to fever and possible cellulitis of abdomen.  PHM includes Afib, RA, HTN, hypothyroidism, PAD and HLD.  Patient admitted for treatment of sepsis with infection source of panniculitis and/or UTI.  Pharmacy consulted to dose warfarin.  Goal of Therapy:  INR 2-3 Monitor platelets by anticoagulation protocol: Yes   Date INR Warfarin Dose  6/25 1.7 5 mg PTA  6/26       Plan:  Current INR is subtherapeutic at 1.7 Patient already received dose PTA on 6/25 INR daily while inpatient  Barrie Folk, PharmD 08/10/2022,1:25 AM

## 2022-08-10 NOTE — H&P (Addendum)
History and Physical    PatientSalah Reyes UJW:[id number] DOB: May 13, 1950 DOA: 08/09/2022 DOS: the patient was seen and examined on 08/10/2022 PCP: Leanna Sato, MD  Patient coming from: Home  Chief Complaint:  Chief Complaint  Patient presents with   Fever    HPI: Kayla Reyes is a 72 y.o. female with medical history significant for Rheumatoid arthritis on immunosuppression with Plaquenil, methotrexate and daily prednisone, A-fib on amiodarone and warfarin with history of DC cardioversion 04/2022, PAD s/p left AKA, morbid obesity, BMI 50-59.9, HTN, hypothyroidism, hospitalized from 3/26 to 05/19/2022 for septic shock secondary to infective panniculitis, who was brought in by EMS with a fever 101.6 associated with nonbloody nonbilious vomiting.  She denied abdominal pain or diarrhea.  She endorses pain of the abdominal wall at the site of prior panniculitis.  Denies dysuria, cough or shortness of breath or chest pain. ED course and data review: Soft BP of 109/52 with temp 100.3 and pulse 96.  CBC unremarkable. lactic 2.5.  CMP unremarkable.  Urinalysis with moderate leukocyte esterase. Chest x-ray shows stable cardiomegaly with chronic bilateral vascular congestion and mild right basilar atelectasis. Patient started on Rocephin and vancomycin. Hospitalist consulted for admission.   Review of Systems: As mentioned in the history of present illness. All other systems reviewed and are negative.  Past Medical History:  Diagnosis Date   Arterial thrombosis (HCC)    a. s/p thromboectomy and thrombolysis with left BKA in 2008 on chronic Coumadin    HLD (hyperlipidemia)    Hypertension    Hypothyroidism    Obesity    PAF (paroxysmal atrial fibrillation) (HCC)    a. noted 12/19 in the setting of hypokalemia; b. CHADS2VASc 4 (HTN, age x 1, vascular disease, female); c. on chronic Coumadin in the setting of prior arterial clot   Rheumatoid arthritis (HCC)    Past Surgical History:   Procedure Laterality Date   BELOW KNEE LEG AMPUTATION     CARDIOVERSION N/A 05/13/2022   Procedure: CARDIOVERSION;  Surgeon: Antonieta Iba, MD;  Location: [facility] ORS;  Service: Cardiovascular;  Laterality: N/A;   Social History:  reports that she has quit smoking. She has never used smokeless tobacco. She reports that she does not currently use alcohol. She reports that she does not currently use drugs.  No Known Allergies  Family History  Problem Relation Age of Onset   Arthritis Mother    Hypertension Mother    Cancer Father    Thyroid disease Father     Prior to Admission medications   Medication Sig Start Date End Date Taking? Authorizing Provider  albuterol (PROVENTIL HFA;VENTOLIN HFA) 108 (90 Base) MCG/ACT inhaler Inhale 2 puffs into the lungs every 6 (six) hours as needed for wheezing or shortness of breath.    [provider]  amiodarone (PACERONE) 200 MG tablet Take 2 tablets (400 mg total) by mouth 2 (two) times daily. Then from 05/22/22--take 200 mg twice a day Then from 05/29/22--take 200 mg daily 05/19/22   Enedina Finner, MD  ascorbic acid (VITAMIN C) 500 MG tablet Take 1 tablet (500 mg total) by mouth 2 (two) times daily. 05/19/22   Enedina Finner, MD  docusate sodium (COLACE) 100 MG capsule Take 1 capsule (100 mg total) by mouth 2 (two) times daily. 05/19/22   Enedina Finner, MD  feeding supplement (ENSURE ENLIVE / ENSURE PLUS) LIQD Take 237 mLs by mouth 3 (three) times daily between meals. 05/19/22   Enedina Finner, MD  fluticasone (FLONASE) 50  MCG/ACT nasal spray Place 1 spray into both nostrils daily as needed for allergies or rhinitis.    [provider]  furosemide (LASIX) 40 MG tablet Take 40 mg by mouth daily. 12/28/21   [provider]  gabapentin (NEURONTIN) 600 MG tablet Take 600 mg by mouth 3 (three) times daily.    [provider]  hydroxychloroquine (PLAQUENIL) 200 MG tablet TAKE 2 TABLETS(400 MG) BY MOUTH EVERY DAY 12/17/18   [provider]  JARDIANCE 10 MG TABS tablet Take 10 mg by mouth daily.    [provider]  leptospermum manuka honey (MEDIHONEY) PSTE paste Apply 1 Application topically daily. 05/19/22   Enedina Finner, MD  levothyroxine (SYNTHROID) 137 MCG tablet Take 1 tablet (137 mcg total) by mouth daily before breakfast. 05/19/22   Enedina Finner, MD  liver oil-zinc oxide (DESITIN) 40 % ointment Apply topically 3 (three) times daily. 05/19/22   Enedina Finner, MD  lovastatin (MEVACOR) 20 MG tablet Take 20 mg by mouth every evening.    [provider]  metoprolol tartrate (LOPRESSOR) 25 MG tablet Take 25 mg by mouth 2 (two) times daily.  01/10/19   [provider]  polyethylene glycol (MIRALAX / GLYCOLAX) 17 g packet Take 17 g by mouth daily. 05/19/22   Enedina Finner, MD  potassium chloride (KLOR-CON M) 10 MEQ tablet Take 10 mEq by mouth daily. 03/08/22   [provider]  predniSONE (DELTASONE) 1 MG tablet Take by mouth.    [provider]  TRADJENTA 5 MG TABS tablet Take 5 mg by mouth daily. 05/04/22   [provider]  warfarin (COUMADIN) 2.5 MG tablet Take 1 tablet (2.5 mg total) by mouth one time only at 4 PM. 05/19/22   Enedina Finner, MD    Physical Exam: Vitals:   08/09/22 1815 08/09/22 2300 08/09/22 2357 08/10/22 0000  BP: (!) 114/55 126/65  (!) 109/52  Pulse: 96     Resp: 18     Temp: 100.3 F (37.9 C)     TempSrc: Oral     SpO2: 100%     Weight:   (!) 170.1 kg   Height:   5\' 8"  (1.727 m)    Physical Exam Vitals and nursing note reviewed.  Constitutional:      General: She is not in acute distress. HENT:     Head: Normocephalic and atraumatic.  Cardiovascular:     Rate and Rhythm: Normal rate and regular rhythm.     Heart sounds: Normal heart sounds.  Pulmonary:     Effort: Pulmonary effort is normal.     Breath sounds: Normal breath sounds.  Abdominal:     Palpations: Abdomen is soft.     Tenderness: There is no abdominal tenderness.     Comments:  Large pannus with edema and tenderness on the left  Musculoskeletal:     Right lower leg: Edema present.  Neurological:     Mental Status: Mental status is at baseline.     Labs on Admission: I have personally reviewed following labs and imaging studies  CBC: Recent Labs  Lab 08/09/22 1818  WBC 7.7  NEUTROABS 6.7  HGB 13.0  HCT 40.5  MCV 95.3  PLT 230   Basic Metabolic Panel: Recent Labs  Lab 08/09/22 1818  NA 134*  K 3.5  CL 100  CO2 23  GLUCOSE 144*  BUN 15  CREATININE 0.99  CALCIUM 8.3*   GFR: Estimated Creatinine Clearance: 88.8 mL/min (by C-G formula based  on SCr of 0.99 mg/dL). Liver Function Tests: Recent Labs  Lab 08/09/22 1818  AST 15  ALT 12  ALKPHOS 35*  BILITOT 1.0  PROT 6.7  ALBUMIN 2.9*   Recent Labs  Lab 08/09/22 1818  LIPASE 32   No results for input(s): "AMMONIA" in the last 168 hours. Coagulation Profile: Recent Labs  Lab 08/09/22 2344  INR 1.7*   Cardiac Enzymes: No results for input(s): "CKTOTAL", "CKMB", "CKMBINDEX", "TROPONINI" in the last 168 hours. BNP (last 3 results) No results for input(s): "PROBNP" in the last 8760 hours. HbA1C: No results for input(s): "HGBA1C" in the last 72 hours. CBG: No results for input(s): "GLUCAP" in the last 168 hours. Lipid Profile: No results for input(s): "CHOL", "HDL", "LDLCALC", "TRIG", "CHOLHDL", "LDLDIRECT" in the last 72 hours. Thyroid Function Tests: No results for input(s): "TSH", "T4TOTAL", "FREET4", "T3FREE", "THYROIDAB" in the last 72 hours. Anemia Panel: No results for input(s): "VITAMINB12", "FOLATE", "FERRITIN", "TIBC", "IRON", "RETICCTPCT" in the last 72 hours. Urine analysis:    Component Value Date/Time   COLORURINE YELLOW (A) 08/09/2022 2344   APPEARANCEUR HAZY (A) 08/09/2022 2344   LABSPEC 1.023 08/09/2022 2344   PHURINE 5.0 08/09/2022 2344   GLUCOSEU >=500 (A) 08/09/2022 2344   HGBUR NEGATIVE 08/09/2022 2344   BILIRUBINUR NEGATIVE 08/09/2022 2344   KETONESUR 5  (A) 08/09/2022 2344   PROTEINUR NEGATIVE 08/09/2022 2344   NITRITE NEGATIVE 08/09/2022 2344   LEUKOCYTESUR MODERATE (A) 08/09/2022 2344    Radiological Exams on Admission: DG Chest 1 View  Result Date: 08/09/2022 CLINICAL DATA:  Fever and vomiting. EXAM: CHEST  1 VIEW COMPARISON:  May 12, 2022 FINDINGS: The cardiac silhouette is mildly enlarged and unchanged in size. There is marked severity calcification of the aortic arch. Stable, mild to moderate severity diffusely increased interstitial lung markings are seen with mild prominence of the pulmonary vasculature. Mild atelectasis is noted within the right lung base. No pleural effusion or pneumothorax is identified. Multilevel degenerative changes seen throughout the thoracic spine. IMPRESSION: Stable cardiomegaly with chronic pulmonary vascular congestion and mild right basilar atelectasis. Electronically Signed   By: Aram Candela M.D.   On: 08/09/2022 23:41     Data Reviewed: Relevant notes from primary care and specialist visits, past discharge summaries as available in EHR, including Care Everywhere. Prior diagnostic testing as pertinent to current admission diagnoses Updated medications and problem lists for reconciliation ED course, including vitals, labs, imaging, treatment and response to treatment Triage notes, nursing and pharmacy notes and ED provider's notes Notable results as noted in HPI   Assessment and Plan: Acute Infective panniculitis, recurrent. 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  WBC normal but could be related to chronic immunosuppression Cefepime and vancomycin Sepsis fluid bolus and fluid maintenance per sepsis protocol Morbid obesity with chronic immunosuppression  present complicating factors to overall prognosis and care Close monitoring in PCU in view of history of septic shock from same source  Rheumatoid arthritis (HCC) Hold all immunosuppressants, Plaquenil, methotrexate and prednisone  Urinary tract infection Urinalysis consistent with UTI Current antibiotic should address UTI Urine culture ordered  Hypotension Essential hypertension Likely secondary to sepsis/severe sepsis Hold all antihypertensives and monitor  Paroxysmal atrial fibrillation (HCC) Continue amiodarone and warfarin, pharmacy to adjust  Hypothyroidism Continue levothyroxine  PAD with Hx of AKA (  above knee amputation), left (HCC) - Patient has history of embolism with occlusion of the Left external iliac artery, CFA and SFA s/p thrombectomy and thrombolysis which required subsequent L AKA in 2008. -Continue warfarin    DVT prophylaxis: warfarin  Consults: none  Advance Care Planning:   Code Status: Prior   Family Communication: none  Disposition Plan: Back to previous home environment  Severity of Illness: The appropriate patient status for this patient is INPATIENT. Inpatient status is judged to be reasonable and necessary in order to provide the required intensity of service to ensure the patient's safety. The patient's presenting symptoms, physical exam findings, and initial radiographic and laboratory data in the context of their chronic comorbidities is felt to place them at high risk for further clinical deterioration. Furthermore, it is not anticipated that the patient will be medically stable for discharge from the hospital within 2 midnights of admission.   * I certify that at the point of admission it is my clinical judgment that the patient will require inpatient hospital care spanning beyond 2 midnights from the point of admission due to high intensity of service, high risk for further deterioration and high frequency of surveillance  required.*  Author: Andris Baumann, MD 08/10/2022 1:01 AM  For on call review [email or web address].

## 2022-08-10 NOTE — Assessment & Plan Note (Signed)
Continue levothyroxine 

## 2022-08-10 NOTE — Consult Note (Signed)
Pharmacy Antibiotic Note  Kayla Reyes is a 72 y.o. female admitted on 08/09/2022 with cellulitis.  Pharmacy has been consulted for vancomycin and cefepime dosing.  Plan: Give vancomycin 2500 mg IV x 1, then start 2000 mg IV every 24 hours Vancomycin levels at steady state or as clinically indicated Start cefepime 2 grams IV every 8 hours Flagyl 500 mg IV every 12 hours per provider Follow renal function for adjustments  Height: 5\' 8"  (172.7 cm) Weight: (!) 170.1 kg (375 lb) IBW/kg (Calculated) : 63.9  Temp (24hrs), Avg:100.3 F (37.9 C), Min:100.3 F (37.9 C), Max:100.3 F (37.9 C)  Recent Labs  Lab 08/09/22 1818 08/09/22 2344  WBC 7.7  --   CREATININE 0.99  --   LATICACIDVEN  --  2.5*    Estimated Creatinine Clearance: 88.8 mL/min (by C-G formula based on SCr of 0.99 mg/dL).    No Known Allergies  Antimicrobials this admission: Ceftriaxone IV x 1 6/26 Vancomycin 6/26>> Cefepime 6/26 >>  Flagyl 6/26 >>   Dose adjustments this admission: N/A  Microbiology results: 6/25 BCx: pending 6/25 UCx: pending    Thank you for allowing pharmacy to be a part of this patient's care.  Barrie Folk, PharmD 08/10/2022 1:15 AM

## 2022-08-10 NOTE — Assessment & Plan Note (Addendum)
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 -Continue warfarin

## 2022-08-10 NOTE — Assessment & Plan Note (Signed)
Hold all immunosuppressants, Plaquenil, methotrexate and prednisone

## 2022-08-10 NOTE — Progress Notes (Addendum)
Courtesy note- no billing.  Patient is seen and examined on morning.  She is admitted by Dr. Para March earlier this a.m. for evaluation of sepsis in the setting of acute infective paniculitis which is recurrent.  She is morbidly obese female, feels weak, not eating well.  Patient had recent similar admission where she got better with IV antibiotics vancomycin, cefepime.  Patient is started on broad-spectrum antibiotics and she will be closely monitored in PCU.  Further management as per hospital course.

## 2022-08-10 NOTE — ED Notes (Signed)
This RN to bedside. Pt is resting with eyes closed at this time, VSS< NAD. Respirations are even and unlabored. Call light is in reach, door is open, light is off. WCTM.

## 2022-08-10 NOTE — Assessment & Plan Note (Signed)
Continue amiodarone and warfarin, pharmacy to adjust

## 2022-08-10 NOTE — Consult Note (Signed)
ANTICOAGULATION CONSULT NOTE - Initial Consult  Pharmacy Consult for warfarin dosing Indication: atrial fibrillation  No Known Allergies  Patient Measurements: Height: 5\' 8"  (172.7 cm) Weight: (!) 170.1 kg (375 lb) IBW/kg (Calculated) : 63.9   Vital Signs: Temp: 100 F (37.8 C) (06/26 0959) Temp Source: Oral (06/26 0959) BP: 97/61 (06/26 1145) Pulse Rate: 99 (06/26 1145)  Labs: Recent Labs    08/09/22 1818 08/09/22 2344 08/10/22 0535  HGB 13.0  --  12.0  HCT 40.5  --  37.8  PLT 230  --  211  LABPROT  --  20.1* 21.7*  INR  --  1.7* 1.9*  CREATININE 0.99  --  1.08*     Estimated Creatinine Clearance: 81.4 mL/min (A) (by C-G formula based on SCr of 1.08 mg/dL (H)).   Medical History: Past Medical History:  Diagnosis Date   Arterial thrombosis (HCC)    a. s/p thromboectomy and thrombolysis with left BKA in 2008 on chronic Coumadin    HLD (hyperlipidemia)    Hypertension    Hypothyroidism    Obesity    PAF (paroxysmal atrial fibrillation) (HCC)    a. noted 12/19 in the setting of hypokalemia; b. CHADS2VASc 4 (HTN, age x 1, vascular disease, female); c. on chronic Coumadin in the setting of prior arterial clot   Rheumatoid arthritis (HCC)     Medications:  Warfarin 10 mg once daily on Mon, Wed, Thurs, Fri Warfarin 5 mg once daily all other days  Drug-drug interactions: All below meds are PTA: Amiodarone 200 mg daily - can increase INR Levothyroxine 150 mcg daily - can increase INR Hydroxychloroquine 400 mg daily - can prolong QT and increase INR Methotrexate 10 mg once weekly - can increase INR  Assessment: 72 yo female presents to ED due to fever and possible cellulitis of abdomen.  PHM includes Afib, RA, HTN, hypothyroidism, PAD and HLD.  Patient admitted for treatment of sepsis with infection source of panniculitis and/or UTI.  Pharmacy consulted to dose warfarin.  Goal of Therapy:  INR 2-3 Monitor platelets by anticoagulation protocol: Yes   Date  INR Warfarin Dose  6/25 1.7 5 mg PTA  6/26 1.9 10 mg PTA    Plan:  Current INR increased but is still subtherapeutic at 1.9 (up from 1.7 yesterday) Administer warfarin 10 mg x 1 in accordance with home regimen INR daily while inpatient  Will M. Dareen Piano, PharmD PGY-1 Pharmacy Resident 08/10/2022 12:47 PM

## 2022-08-10 NOTE — Assessment & Plan Note (Signed)
Urinalysis consistent with UTI Current antibiotic should address UTI Urine culture ordered

## 2022-08-10 NOTE — Consult Note (Signed)
PHARMACY -  BRIEF ANTIBIOTIC NOTE   Pharmacy has received consult(s) for vancomycin dosing from an ED provider.  The patient's profile has been reviewed for ht/wt/allergies/indication/available labs.    One time order(s) placed for vancomycin 2 grams IV x 1  Further antibiotics/pharmacy consults should be ordered by admitting physician if indicated.                       Thank you, Barrie Folk 08/10/2022  12:42 AM

## 2022-08-11 DIAGNOSIS — M793 Panniculitis, unspecified: Secondary | ICD-10-CM | POA: Diagnosis not present

## 2022-08-11 DIAGNOSIS — Z6841 Body Mass Index (BMI) 40.0 and over, adult: Secondary | ICD-10-CM

## 2022-08-11 DIAGNOSIS — Z89612 Acquired absence of left leg above knee: Secondary | ICD-10-CM

## 2022-08-11 DIAGNOSIS — I959 Hypotension, unspecified: Secondary | ICD-10-CM | POA: Diagnosis not present

## 2022-08-11 DIAGNOSIS — N390 Urinary tract infection, site not specified: Secondary | ICD-10-CM

## 2022-08-11 DIAGNOSIS — E039 Hypothyroidism, unspecified: Secondary | ICD-10-CM

## 2022-08-11 DIAGNOSIS — B9689 Other specified bacterial agents as the cause of diseases classified elsewhere: Secondary | ICD-10-CM

## 2022-08-11 DIAGNOSIS — Z9189 Other specified personal risk factors, not elsewhere classified: Secondary | ICD-10-CM | POA: Diagnosis not present

## 2022-08-11 DIAGNOSIS — I480 Paroxysmal atrial fibrillation: Secondary | ICD-10-CM

## 2022-08-11 LAB — BLOOD CULTURE ID PANEL (REFLEXED) - BCID2

## 2022-08-11 LAB — PROTIME-INR
INR: 2.1 — ABNORMAL HIGH (ref 0.8–1.2)
Prothrombin Time: 23.4 seconds — ABNORMAL HIGH (ref 11.4–15.2)

## 2022-08-11 LAB — URINE CULTURE: Culture: >=100000 — AB

## 2022-08-11 MED ORDER — WARFARIN SODIUM 5 MG PO TABS
5.0000 mg | ORAL_TABLET | Freq: Once | ORAL | Status: AC
  Administered 2022-08-11: 5 mg via ORAL
  Filled 2022-08-11: qty 1

## 2022-08-11 NOTE — Care Management Important Message (Signed)
Important Message  Patient Details  Name: Kayla Reyes MRN: [id number] Date of Birth: May 23, 1950   Medicare Important Message Given:  N/A - LOS <3 / Initial given by admissions     Johnell Comings 08/11/2022, 7:41 PM

## 2022-08-11 NOTE — Progress Notes (Signed)
  Progress Note   Patient: Kayla Reyes ZOX:[id number] DOB: 06/04/1950 DOA: 08/09/2022     1 DOS: the patient was seen and examined on 08/11/2022   Brief hospital course: Kayla Reyes is a 72 y.o. female with medical history significant for Rheumatoid arthritis on immunosuppression with Plaquenil, methotrexate and daily prednisone, A-fib on amiodarone and warfarin with history of DC cardioversion 04/2022, PAD s/p left AKA, morbid obesity, BMI 50-59.9, HTN, hypothyroidism, hospitalized from 3/26 to 05/19/2022 for septic shock secondary to infective panniculitis, who was brought in by EMS with a fever 101.6 associated with nonbloody nonbilious vomiting.  She denied abdominal pain or diarrhea.  She endorses pain of the abdominal wall at the site of prior panniculitis.  Denies dysuria, cough or shortness of breath or chest pain.  Patient is admitted with impression of sepsis, acute infective panniculitis, started on broad-spectrum antibiotics cefepime, vancomycin.  Assessment and Plan: Acute Infective panniculitis, recurrent, UTI At risk for severe infection due to chronic immunosuppressive treatment Morbid obesity, BMI 50-59.9 Sepsis criteria include fever, tachycardia, low BP, lactic acidosis Fever, tachycardia improved. BP better, lactic trended down. Blood cultures reviewed, discussed with pharmacy and changed antibiotics to Cefepime+flagyl. Stop vancomycin. Close monitoring in PCU in view of history of sepsis due to panniculitis, UTI  Klebsiella Urinary tract infection Urine cultures positive for Klebsiella. Follow sensitivities. Continue Cefepime.  Rheumatoid arthritis (HCC) Hold all immunosuppressants, Plaquenil, methotrexate and prednisone  Hypotension Essential hypertension Likely secondary to sepsis/severe sepsis Hold all antihypertensives and monitor BP closely.  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 Continue warfarin therapy. Follow PT/ INR.      Subjective: Patient is seen and examined today morning.  She is lying in bed, feels weak, not eating well.  Did not get out of bed.  Physical Exam: Vitals:   08/10/22 1709 08/10/22 1932 08/11/22 0448 08/11/22 0700  BP: (!) 107/56 119/82 114/62 112/68  Pulse: 82 95 91 91  Resp: 18 18 17 18   Temp: 99.7 F (37.6 C) 100.3 F (37.9 C) 99.3 F (37.4 C) 99.7 F (37.6 C)  TempSrc:  Oral Oral Oral  SpO2: 100% 100% 99% 98%  Weight:      Height:       General - Elderly morbidly obese African-American female, no apparent distress HEENT - PERRLA, EOMI, atraumatic head, non tender sinuses. Lung - Clear, diffuse Rales and rhonchi. Heart - S1, S2 heard, no murmurs, rubs, 2+ pedal edema. Abdomen soft, obese, large pannus with warmth, swelling, redness noted. Neuro - Alert, awake oriented x3, non focal exam. Skin - Warm and dry. Data Reviewed:  PT/INR, Urine cultures, blood cultures  Family Communication: Patient understands and agrees with current plan.  Disposition: Status is: Inpatient Remains inpatient appropriate because: IV antibiotic therapy, weakness, poor oral intake  Planned Discharge Destination: Home with Home Health    Time spent: 47 minutes  Author: [name], MD 08/11/2022 1:42 PM  For on call review [email or web address].

## 2022-08-11 NOTE — Progress Notes (Signed)
   08/11/22 1500  Spiritual Encounters  Type of Visit Initial  Care provided to: Patient  Referral source Chaplain assessment  Reason for visit Routine spiritual support  OnCall Visit No  Interventions  Spiritual Care Interventions Made Compassionate presence  Intervention Outcomes  Outcomes Awareness of support  Spiritual Care Plan  Spiritual Care Issues Still Outstanding No further spiritual care needs at this time (see row info)   Chaplain services remain available for follow up spiritual and emotional support as needed. Please consult if needs arise

## 2022-08-11 NOTE — Progress Notes (Signed)
PHARMACY - PHYSICIAN COMMUNICATION CRITICAL VALUE ALERT - BLOOD CULTURE IDENTIFICATION (BCID)  Kayla Reyes is an 72 y.o. female who presented to [facility] on 08/09/2022 with a chief complaint of fever  Assessment:  Blood cultures from 6/25 with GNR in 1of 4 bottles (anaerobic bottle), BCID did not detect any organisms on the panel.    Name of physician (or Provider) Contacted: Dr Clide Dales  Current antibiotics: Vancomycin, cefepime, metrondizole  Changes to prescribed antibiotics recommended:  Patient is on recommended antibiotics - No changes needed but will stop vancomycin  Results for orders placed or performed during the hospital encounter of 08/09/22  Blood Culture ID Panel (Reflexed) (Collected: 08/09/2022 11:43 PM)  Result Value Ref Range   Enterococcus faecalis NOT DETECTED NOT DETECTED   Enterococcus Faecium NOT DETECTED NOT DETECTED   Listeria monocytogenes NOT DETECTED NOT DETECTED   Staphylococcus species NOT DETECTED NOT DETECTED   Staphylococcus aureus (BCID) NOT DETECTED NOT DETECTED   Staphylococcus epidermidis NOT DETECTED NOT DETECTED   Staphylococcus lugdunensis NOT DETECTED NOT DETECTED   Streptococcus species NOT DETECTED NOT DETECTED   Streptococcus agalactiae NOT DETECTED NOT DETECTED   Streptococcus pneumoniae NOT DETECTED NOT DETECTED   Streptococcus pyogenes NOT DETECTED NOT DETECTED   A.calcoaceticus-baumannii NOT DETECTED NOT DETECTED   Bacteroides fragilis NOT DETECTED NOT DETECTED   Enterobacterales NOT DETECTED NOT DETECTED   Enterobacter cloacae complex NOT DETECTED NOT DETECTED   Escherichia coli NOT DETECTED NOT DETECTED   Klebsiella aerogenes NOT DETECTED NOT DETECTED   Klebsiella oxytoca NOT DETECTED NOT DETECTED   Klebsiella pneumoniae NOT DETECTED NOT DETECTED   Proteus species NOT DETECTED NOT DETECTED   Salmonella species NOT DETECTED NOT DETECTED   Serratia marcescens NOT DETECTED NOT DETECTED   Haemophilus influenzae NOT DETECTED NOT  DETECTED   Neisseria meningitidis NOT DETECTED NOT DETECTED   Pseudomonas aeruginosa NOT DETECTED NOT DETECTED   Stenotrophomonas maltophilia NOT DETECTED NOT DETECTED   Candida albicans NOT DETECTED NOT DETECTED   Candida auris NOT DETECTED NOT DETECTED   Candida glabrata NOT DETECTED NOT DETECTED   Candida krusei NOT DETECTED NOT DETECTED   Candida parapsilosis NOT DETECTED NOT DETECTED   Candida tropicalis NOT DETECTED NOT DETECTED   Cryptococcus neoformans/gattii NOT DETECTED NOT DETECTED   Juliette Alcide, PharmD, BCPS, BCIDP Work Cell: [phone] 08/11/2022 2:39 PM

## 2022-08-11 NOTE — Progress Notes (Signed)
Per DR Veryl Speak tele monitoring

## 2022-08-11 NOTE — Consult Note (Addendum)
ANTICOAGULATION CONSULT NOTE - Initial Consult  Pharmacy Consult for warfarin dosing Indication: atrial fibrillation  No Known Allergies  Patient Measurements: Height: 5\' 8"  (172.7 cm) Weight: (!) 170.1 kg (375 lb) IBW/kg (Calculated) : 63.9   Vital Signs: Temp: 99.3 F (37.4 C) (06/27 0448) Temp Source: Oral (06/27 0448) BP: 114/62 (06/27 0448) Pulse Rate: 91 (06/27 0448)  Labs: Recent Labs    08/09/22 1818 08/09/22 2344 08/10/22 0535 08/11/22 0411  HGB 13.0  --  12.0  --   HCT 40.5  --  37.8  --   PLT 230  --  211  --   LABPROT  --  20.1* 21.7* 23.4*  INR  --  1.7* 1.9* 2.1*  CREATININE 0.99  --  1.08*  --      Estimated Creatinine Clearance: 81.4 mL/min (A) (by C-G formula based on SCr of 1.08 mg/dL (H)).   Medical History: Past Medical History:  Diagnosis Date   Arterial thrombosis (HCC)    a. s/p thromboectomy and thrombolysis with left BKA in 2008 on chronic Coumadin    HLD (hyperlipidemia)    Hypertension    Hypothyroidism    Obesity    PAF (paroxysmal atrial fibrillation) (HCC)    a. noted 12/19 in the setting of hypokalemia; b. CHADS2VASc 4 (HTN, age x 1, vascular disease, female); c. on chronic Coumadin in the setting of prior arterial clot   Rheumatoid arthritis (HCC)     Medications:  Warfarin 10 mg once daily on Mon, Wed, Thurs, Fri Warfarin 5 mg once daily all other days  Drug-drug interactions: All below meds are PTA: Amiodarone 200 mg daily - can increase INR. Continued on admission Hydroxychloroquine 400 mg daily - can prolong QT and increase INR. Holding inpatient Methotrexate 10 mg once weekly - can increase INR. Holding inpatient  Assessment: 72 yo female presents to ED due to fever and possible cellulitis of abdomen.  PHM includes Afib, RA, HTN, hypothyroidism, PAD and HLD.  Patient admitted for treatment of sepsis with infection source of panniculitis and/or UTI.  Pharmacy consulted to dose warfarin.  Goal of Therapy:  INR  2-3 Monitor platelets by anticoagulation protocol: Yes   Date INR Warfarin Dose  6/25 1.7 5 mg PTA  6/26 1.9 10 mg   6/27 2.1 5 mg     Plan:  INR therapeutic. Change in INR of +0.2. Will continue with home dosing.  Give warfarin 5 mg x 1 today INR daily while inpatient   Elliot Gurney, PharmD, BCPS Clinical Pharmacist  08/11/2022 7:46 AM

## 2022-08-12 DIAGNOSIS — M793 Panniculitis, unspecified: Secondary | ICD-10-CM | POA: Diagnosis not present

## 2022-08-12 DIAGNOSIS — Z9189 Other specified personal risk factors, not elsewhere classified: Secondary | ICD-10-CM | POA: Diagnosis not present

## 2022-08-12 DIAGNOSIS — Z6841 Body Mass Index (BMI) 40.0 and over, adult: Secondary | ICD-10-CM | POA: Diagnosis not present

## 2022-08-12 DIAGNOSIS — I959 Hypotension, unspecified: Secondary | ICD-10-CM | POA: Diagnosis not present

## 2022-08-12 DIAGNOSIS — E876 Hypokalemia: Secondary | ICD-10-CM

## 2022-08-12 LAB — CBC
HCT: 33.2 % — ABNORMAL LOW (ref 36.0–46.0)
Hemoglobin: 10.8 g/dL — ABNORMAL LOW (ref 12.0–15.0)
MCH: 30.6 pg (ref 26.0–34.0)
MCHC: 32.5 g/dL (ref 30.0–36.0)
MCV: 94.1 fL (ref 80.0–100.0)
Platelets: 221 10*3/uL (ref 150–400)
RBC: 3.53 MIL/uL — ABNORMAL LOW (ref 3.87–5.11)
RDW: 15.4 % (ref 11.5–15.5)
WBC: 6.6 10*3/uL (ref 4.0–10.5)
nRBC: 0.3 % — ABNORMAL HIGH (ref 0.0–0.2)

## 2022-08-12 LAB — BASIC METABOLIC PANEL
Anion gap: 9 (ref 5–15)
BUN: 13 mg/dL (ref 8–23)
CO2: 25 mmol/L (ref 22–32)
Calcium: 8 mg/dL — ABNORMAL LOW (ref 8.9–10.3)
Chloride: 105 mmol/L (ref 98–111)
Creatinine, Ser: 0.83 mg/dL (ref 0.44–1.00)
GFR, Estimated: >60 mL/min (ref >60)
Glucose, Bld: 106 mg/dL — ABNORMAL HIGH (ref 70–99)
Potassium: 3.1 mmol/L — ABNORMAL LOW (ref 3.5–5.1)
Sodium: 139 mmol/L (ref 135–145)

## 2022-08-12 LAB — PROTIME-INR
INR: 2.4 — ABNORMAL HIGH (ref 0.8–1.2)
Prothrombin Time: 26.7 seconds — ABNORMAL HIGH (ref 11.4–15.2)

## 2022-08-12 LAB — URINE CULTURE

## 2022-08-12 MED ORDER — METHOTREXATE SODIUM 2.5 MG PO TABS
10.0000 mg | ORAL_TABLET | ORAL | Status: DC
  Administered 2022-08-15: 10 mg via ORAL
  Filled 2022-08-12: qty 4

## 2022-08-12 MED ORDER — POTASSIUM CHLORIDE CRYS ER 20 MEQ PO TBCR
40.0000 meq | EXTENDED_RELEASE_TABLET | Freq: Two times a day (BID) | ORAL | Status: DC
  Administered 2022-08-12 – 2022-08-15 (×7): 40 meq via ORAL
  Filled 2022-08-12 (×7): qty 2

## 2022-08-12 MED ORDER — FOLIC ACID 1 MG PO TABS
1.0000 mg | ORAL_TABLET | Freq: Every day | ORAL | Status: DC
  Administered 2022-08-12 – 2022-08-16 (×5): 1 mg via ORAL
  Filled 2022-08-12 (×5): qty 1

## 2022-08-12 MED ORDER — POTASSIUM CHLORIDE 20 MEQ PO PACK
40.0000 meq | PACK | Freq: Two times a day (BID) | ORAL | Status: DC
  Filled 2022-08-12: qty 2

## 2022-08-12 MED ORDER — VITAMIN C 500 MG PO TABS
500.0000 mg | ORAL_TABLET | Freq: Two times a day (BID) | ORAL | Status: DC
  Administered 2022-08-12 – 2022-08-16 (×9): 500 mg via ORAL
  Filled 2022-08-12 (×9): qty 1

## 2022-08-12 MED ORDER — GABAPENTIN 300 MG PO CAPS
600.0000 mg | ORAL_CAPSULE | Freq: Three times a day (TID) | ORAL | Status: DC
  Administered 2022-08-12 – 2022-08-16 (×13): 600 mg via ORAL
  Filled 2022-08-12 (×13): qty 2

## 2022-08-12 MED ORDER — DOCUSATE SODIUM 100 MG PO CAPS
100.0000 mg | ORAL_CAPSULE | Freq: Two times a day (BID) | ORAL | Status: DC
  Administered 2022-08-12 – 2022-08-16 (×9): 100 mg via ORAL
  Filled 2022-08-12 (×9): qty 1

## 2022-08-12 MED ORDER — FUROSEMIDE 40 MG PO TABS
40.0000 mg | ORAL_TABLET | Freq: Every day | ORAL | Status: DC
  Administered 2022-08-13 – 2022-08-14 (×2): 40 mg via ORAL
  Filled 2022-08-12 (×2): qty 1

## 2022-08-12 MED ORDER — FUROSEMIDE 10 MG/ML IJ SOLN
60.0000 mg | Freq: Once | INTRAMUSCULAR | Status: AC
  Administered 2022-08-12: 60 mg via INTRAVENOUS
  Filled 2022-08-12: qty 6

## 2022-08-12 MED ORDER — HYDROXYCHLOROQUINE SULFATE 200 MG PO TABS
400.0000 mg | ORAL_TABLET | Freq: Every day | ORAL | Status: DC
  Administered 2022-08-12 – 2022-08-16 (×5): 400 mg via ORAL
  Filled 2022-08-12 (×5): qty 2

## 2022-08-12 MED ORDER — WARFARIN SODIUM 5 MG PO TABS
5.0000 mg | ORAL_TABLET | Freq: Once | ORAL | Status: AC
  Administered 2022-08-12: 5 mg via ORAL
  Filled 2022-08-12: qty 1

## 2022-08-12 MED ORDER — FLUTICASONE PROPIONATE 50 MCG/ACT NA SUSP: 1.0000 | Freq: Every day | NASAL | Status: DC | PRN

## 2022-08-12 NOTE — Progress Notes (Signed)
  Progress Note   Patient: Kayla Reyes ZOX:[id number] DOB: Apr 10, 1950 DOA: 08/09/2022     2 DOS: the patient was seen and examined on 08/12/2022   Brief hospital course: Kayla Reyes is a 72 y.o. female with medical history significant for Rheumatoid arthritis on immunosuppression with Plaquenil, methotrexate and daily prednisone, A-fib on amiodarone and warfarin with history of DC cardioversion 04/2022, PAD s/p left AKA, morbid obesity, BMI 50-59.9, HTN, hypothyroidism, hospitalized from 3/26 to 05/19/2022 for septic shock secondary to infective panniculitis, who was brought in by EMS with a fever 101.6 associated with nonbloody nonbilious vomiting.  She denied abdominal pain or diarrhea.  She endorses pain of the abdominal wall at the site of prior panniculitis.  Denies dysuria, cough or shortness of breath or chest pain.  Patient is admitted with impression of sepsis, acute infective panniculitis, started on broad-spectrum antibiotics cefepime, vancomycin.  Assessment and Plan: Acute Infective panniculitis, recurrent, UTI At risk for severe infection due to chronic immunosuppressive treatment Fever, tachycardia improved. BP better, lactic trended down. Blood cultures reviewed, discussed with pharmacy and changed antibiotics to Cefepime+flagyl. Stopped vancomycin. She will be started on Lasix for abdominal distention, chronic lymphedema.  Klebsiella Urinary tract infection Urine cultures positive for Klebsiella. Continue Cefepime.  Hypokalemia- Oral potassium supplements ordered.  Rheumatoid arthritis (HCC) Restarted Plaquenil, methotrexate and prednisone  Hypotension Likely secondary to sepsis/severe sepsis Hold all antihypertensives and monitor BP closely.  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 Continue warfarin therapy. Follow PT/ INR.      Subjective: Patient is seen and examined today morning.  She is lying in bed, eating well.  Left side abdominal distention, edema noted. States she takes fluid pill.  Physical Exam: Vitals:   08/11/22 1638 08/11/22 1957 08/12/22 0453 08/12/22 0726  BP: (!) 91/53 98/63 111/72 (!) 96/56  Pulse: 86 94 82 82  Resp: 18 19 18 18   Temp: 99.4 F (37.4 C) 99.4 F (37.4 C) 99.2 F (37.3 C) 98.9 F (37.2 C)  TempSrc: Oral Oral Oral   SpO2: 99% 97% 97% 96%  Weight:      Height:       General - Elderly morbidly obese African-American female, no apparent distress HEENT - PERRLA, EOMI, atraumatic head, non tender sinuses. Lung - Clear, diffuse Rales and rhonchi. Heart - S1, S2 heard, no murmurs, rubs, 2+ pedal edema. Abdomen soft, obese, large pannus with warmth, left > right swelling, redness noted. Neuro - Alert, awake oriented x3, non focal exam. Skin - Warm and dry. Data Reviewed:  PT/INR, Urine cultures, blood cultures  Family Communication: Patient understands and agrees with current plan.  Disposition: Status is: Inpatient Remains inpatient appropriate because: IV antibiotic therapy, weakness.  Planned Discharge Destination: Home with Home Health    Time spent: 43 minutes  Author: [name], MD 08/12/2022 1:06 PM  For on call review [email or web address].

## 2022-08-12 NOTE — Consult Note (Addendum)
ANTICOAGULATION CONSULT NOTE - Initial Consult  Pharmacy Consult for warfarin dosing Indication: atrial fibrillation  No Known Allergies  Patient Measurements: Height: 5\' 8"  (172.7 cm) Weight: (!) 170.1 kg (375 lb) IBW/kg (Calculated) : 63.9   Vital Signs: Temp: 98.9 F (37.2 C) (06/28 0726) Temp Source: Oral (06/28 0453) BP: 96/56 (06/28 0726) Pulse Rate: 82 (06/28 0726)  Labs: Recent Labs    08/09/22 1818 08/09/22 2344 08/10/22 0535 08/11/22 0411 08/12/22 0438  HGB 13.0  --  12.0  --  10.8*  HCT 40.5  --  37.8  --  33.2*  PLT 230  --  211  --  221  LABPROT  --    < > 21.7* 23.4* 26.7*  INR  --    < > 1.9* 2.1* 2.4*  CREATININE 0.99  --  1.08*  --  0.83   < > = values in this interval not displayed.     Estimated Creatinine Clearance: 105.9 mL/min (by C-G formula based on SCr of 0.83 mg/dL).   Medical History: Past Medical History:  Diagnosis Date   Arterial thrombosis (HCC)    a. s/p thromboectomy and thrombolysis with left BKA in 2008 on chronic Coumadin    HLD (hyperlipidemia)    Hypertension    Hypothyroidism    Obesity    PAF (paroxysmal atrial fibrillation) (HCC)    a. noted 12/19 in the setting of hypokalemia; b. CHADS2VASc 4 (HTN, age x 1, vascular disease, female); c. on chronic Coumadin in the setting of prior arterial clot   Rheumatoid arthritis (HCC)     Medications:  Warfarin 10 mg once daily on Mon, Wed, Thurs, Fri Warfarin 5 mg once daily all other days  Drug-drug interactions: Amiodarone 200 mg daily - can increase INR. Continued on admission Hydroxychloroquine 400 mg daily - can prolong QT and increase INR. Holding inpatient Methotrexate 10 mg once weekly - can increase INR. Holding inpatient Cefepime 2 grams every 8 hours - increased therapeutic effect  Assessment: 72 yo female presents to ED due to fever and possible cellulitis of abdomen.  PHM includes Afib, RA, HTN, hypothyroidism, PAD and HLD.  Patient admitted for treatment of  sepsis with infection source of panniculitis and/or UTI.  Pharmacy consulted to dose warfarin.  Chest xray showing pulmonary vascular congestion  Goal of Therapy:  INR 2-3 Monitor platelets by anticoagulation protocol: Yes   Date INR Warfarin Dose  6/25 1.7 5 mg PTA  6/26 1.9 10 mg   6/27 2.1 5 mg   6/28 2.4 5 mg    Plan:  INR therapeutic. Gave 5 mg dose instead of 10 mg yesterday, yet INR still increased by +0.5 over past 2 days. Suspect this is from 10 mg dose on 6/26. Give warfarin 5 mg x 1 again today INR daily while inpatient   Elliot Gurney, PharmD, BCPS Clinical Pharmacist  08/12/2022 7:29 AM

## 2022-08-13 DIAGNOSIS — Z6841 Body Mass Index (BMI) 40.0 and over, adult: Secondary | ICD-10-CM | POA: Diagnosis not present

## 2022-08-13 DIAGNOSIS — Z9189 Other specified personal risk factors, not elsewhere classified: Secondary | ICD-10-CM | POA: Diagnosis not present

## 2022-08-13 DIAGNOSIS — I959 Hypotension, unspecified: Secondary | ICD-10-CM | POA: Diagnosis not present

## 2022-08-13 DIAGNOSIS — M793 Panniculitis, unspecified: Secondary | ICD-10-CM | POA: Diagnosis not present

## 2022-08-13 LAB — CBC
HCT: 32.7 % — ABNORMAL LOW (ref 36.0–46.0)
Hemoglobin: 10.5 g/dL — ABNORMAL LOW (ref 12.0–15.0)
MCH: 30.4 pg (ref 26.0–34.0)
MCHC: 32.1 g/dL (ref 30.0–36.0)
MCV: 94.8 fL (ref 80.0–100.0)
Platelets: 223 10*3/uL (ref 150–400)
RBC: 3.45 MIL/uL — ABNORMAL LOW (ref 3.87–5.11)
RDW: 15.7 % — ABNORMAL HIGH (ref 11.5–15.5)
WBC: 7.3 10*3/uL (ref 4.0–10.5)
nRBC: 0.3 % — ABNORMAL HIGH (ref 0.0–0.2)

## 2022-08-13 LAB — BASIC METABOLIC PANEL
Anion gap: 6 (ref 5–15)
BUN: 13 mg/dL (ref 8–23)
CO2: 26 mmol/L (ref 22–32)
Calcium: 7.9 mg/dL — ABNORMAL LOW (ref 8.9–10.3)
Chloride: 108 mmol/L (ref 98–111)
Creatinine, Ser: 0.78 mg/dL (ref 0.44–1.00)
GFR, Estimated: >60 mL/min (ref >60)
Glucose, Bld: 109 mg/dL — ABNORMAL HIGH (ref 70–99)
Potassium: 3.5 mmol/L (ref 3.5–5.1)
Sodium: 140 mmol/L (ref 135–145)

## 2022-08-13 LAB — PROTIME-INR
INR: 2.4 — ABNORMAL HIGH (ref 0.8–1.2)
Prothrombin Time: 26.2 seconds — ABNORMAL HIGH (ref 11.4–15.2)

## 2022-08-13 MED ORDER — WARFARIN SODIUM 5 MG PO TABS
5.0000 mg | ORAL_TABLET | Freq: Once | ORAL | Status: AC
  Administered 2022-08-13: 5 mg via ORAL
  Filled 2022-08-13: qty 1

## 2022-08-13 NOTE — Consult Note (Signed)
ANTICOAGULATION CONSULT NOTE - Initial Consult  Pharmacy Consult for warfarin dosing Indication: atrial fibrillation  No Known Allergies  Patient Measurements: Height: 5\' 8"  (172.7 cm) Weight: (!) 170.1 kg (375 lb) IBW/kg (Calculated) : 63.9   Vital Signs: Temp: 98.9 F (37.2 C) (06/29 0809) Temp Source: Oral (06/29 0809) BP: 97/60 (06/29 0809) Pulse Rate: 73 (06/29 0809)  Labs: Recent Labs    08/11/22 0411 08/12/22 0438 08/13/22 0418  HGB  --  10.8* 10.5*  HCT  --  33.2* 32.7*  PLT  --  221 223  LABPROT 23.4* 26.7* 26.2*  INR 2.1* 2.4* 2.4*  CREATININE  --  0.83 0.78     Estimated Creatinine Clearance: 109.9 mL/min (by C-G formula based on SCr of 0.78 mg/dL).   Medical History: Past Medical History:  Diagnosis Date   Arterial thrombosis (HCC)    a. s/p thromboectomy and thrombolysis with left BKA in 2008 on chronic Coumadin    HLD (hyperlipidemia)    Hypertension    Hypothyroidism    Obesity    PAF (paroxysmal atrial fibrillation) (HCC)    a. noted 12/19 in the setting of hypokalemia; b. CHADS2VASc 4 (HTN, age x 1, vascular disease, female); c. on chronic Coumadin in the setting of prior arterial clot   Rheumatoid arthritis (HCC)     Medications:  Warfarin 10 mg once daily on Mon, Wed, Thurs, Fri Warfarin 5 mg once daily all other days  Drug-drug interactions: Amiodarone 200 mg daily - can increase INR. Continued on admission Hydroxychloroquine 400 mg daily - can prolong QT and increase INR. Holding inpatient Methotrexate 10 mg once weekly - can increase INR. Holding inpatient Cefepime 2 grams every 8 hours - increased therapeutic effect  Assessment: 72 yo female presents to ED due to fever and possible cellulitis of abdomen.  PHM includes Afib, RA, HTN, hypothyroidism, PAD and HLD.  Patient admitted for treatment of sepsis with infection source of panniculitis and/or UTI.  Pharmacy consulted to dose warfarin.  Chest xray showing pulmonary vascular  congestion  Goal of Therapy:  INR 2-3 Monitor platelets by anticoagulation protocol: Yes   Date INR Warfarin Dose  6/25 1.7 5 mg PTA  6/26 1.9 10 mg   6/27 2.1 5 mg   6/28 2.4 5 mg  6/29 2.4 5 mg    Plan:  Give warfarin 5 mg x 1 again today INR daily while inpatient   Bettey Costa, PharmD Clinical Pharmacist 08/13/2022 8:25 AM

## 2022-08-13 NOTE — Progress Notes (Signed)
  Progress Note   Patient: Kayla Reyes NWG:[id number] DOB: 05/23/1950 DOA: 08/09/2022     3 DOS: the patient was seen and examined on 08/13/2022   Brief hospital course: [name] is a 72 y.o. female with medical history significant for Rheumatoid arthritis on immunosuppression with Plaquenil, methotrexate and daily prednisone, A-fib on amiodarone and warfarin with history of DC cardioversion 04/2022, PAD s/p left AKA, morbid obesity, BMI 50-59.9, HTN, hypothyroidism, hospitalized from 3/26 to 05/19/2022 for septic shock secondary to infective panniculitis, who was brought in by EMS with a fever 101.6 associated with nonbloody nonbilious vomiting.  She denied abdominal pain or diarrhea.  She endorses pain of the abdominal wall at the site of prior panniculitis.  Denies dysuria, cough or shortness of breath or chest pain.  Patient is admitted with impression of sepsis, acute infective panniculitis, started on broad-spectrum antibiotics cefepime, vancomycin, flagyl. Later vanc stopped. Urine cultures positive for Klebsiella.  Assessment and Plan: Acute Infective panniculitis, recurrent, UTI At risk for severe infection due to chronic immunosuppressive treatment Fever, tachycardia improved. BP better, lactic trended down. Continue Cefepime+flagyl.  Continue Lasix for abdominal distention, chronic lymphedema.  Klebsiella Urinary tract infection Urine cultures positive for Klebsiella. Continue Cefepime.  Hypokalemia- Oral potassium supplements daily.  Rheumatoid arthritis (HCC) Restarted Plaquenil, methotrexate and prednisone  Hypotension Likely secondary to sepsis/severe sepsis Hold all antihypertensives and monitor BP closely.  Paroxysmal atrial fibrillation (HCC) Continue amiodarone and warfarin, pharmacy to adjust Warfarin, follow PT/ INR.  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 Continue warfarin therapy. Follow PT/ INR.  Morbid obesity - BMI 57 Diet, exercise and weight reduction advised. Bariatric surgery eval needed.  Supportive care. DVT prophylaxis CODE STATUS -FULL CODE.     Subjective: Patient is seen and examined today morning.  She is lying in bed, feels better, eating well.  Asks about recurrence of the condition and antibiotic tx. Physical Exam: Vitals:   08/12/22 0726 08/12/22 1609 08/13/22 0433 08/13/22 0809  BP: (!) 96/56 114/73 (!) 120/97 97/60  Pulse: 82 74 75 73  Resp: 18 17 18 18   Temp: 98.9 F (37.2 C) 98.6 F (37 C) 98.2 F (36.8 C) 98.9 F (37.2 C)  TempSrc:  Oral  Oral  SpO2: 96% 100% 99% 100%  Weight:      Height:       General - Elderly morbidly obese African-American female, no apparent distress HEENT - PERRLA, EOMI, atraumatic head, non tender sinuses. Lung - Clear, diffuse Rales and rhonchi. Heart - S1, S2 heard, no murmurs, rubs, 2+ pedal edema. Abdomen soft, obese, large pannus with warmth, left > right swelling, redness noted. Neuro - Alert, awake oriented x3, non focal exam. Skin - Warm and dry. Data Reviewed:  CBC, BMP  Family Communication: Patient understands and agrees with current plan.  Disposition: Status is: Inpatient Remains inpatient appropriate because: IV antibiotic therapy, weakness.  Planned Discharge Destination: Home with Home Health    Time spent: 43 minutes  Author: [name], MD 08/13/2022 3:43 PM  For on call review [email or web address].

## 2022-08-13 NOTE — Plan of Care (Signed)
  Problem: Fluid Volume: Goal: Hemodynamic stability will improve Outcome: Progressing   Problem: Clinical Measurements: Goal: Diagnostic test results will improve Outcome: Progressing Goal: Signs and symptoms of infection will decrease Outcome: Progressing   Problem: Respiratory: Goal: Ability to maintain adequate ventilation will improve Outcome: Progressing   Problem: Education: Goal: Knowledge of General Education information will improve Description: Including pain rating scale, medication(s)/side effects and non-pharmacologic comfort measures Outcome: Progressing   Problem: Health Behavior/Discharge Planning: Goal: Ability to manage health-related needs will improve Outcome: Progressing   Problem: Clinical Measurements: Goal: Ability to maintain clinical measurements within normal limits will improve Outcome: Progressing Goal: Will remain free from infection Outcome: Progressing Goal: Diagnostic test results will improve Outcome: Progressing Goal: Respiratory complications will improve Outcome: Progressing Goal: Cardiovascular complication will be avoided Outcome: Progressing   Problem: Activity: Goal: Risk for activity intolerance will decrease Outcome: Progressing   Problem: Nutrition: Goal: Adequate nutrition will be maintained Outcome: Progressing   Problem: Coping: Goal: Level of anxiety will decrease Outcome: Progressing   Problem: Elimination: Goal: Will not experience complications related to bowel motility Outcome: Progressing Goal: Will not experience complications related to urinary retention Outcome: Progressing   Problem: Pain Managment: Goal: General experience of comfort will improve Outcome: Progressing   Problem: Safety: Goal: Ability to remain free from injury will improve Outcome: Progressing   Problem: Skin Integrity: Goal: Risk for impaired skin integrity will decrease Outcome: Progressing   Problem: Urinary  Elimination: Goal: Signs and symptoms of infection will decrease Outcome: Progressing   

## 2022-08-14 ENCOUNTER — Inpatient Hospital Stay: Payer: Medicare HMO

## 2022-08-14 ENCOUNTER — Encounter: Payer: Self-pay | Admitting: Internal Medicine

## 2022-08-14 DIAGNOSIS — Z9189 Other specified personal risk factors, not elsewhere classified: Secondary | ICD-10-CM | POA: Diagnosis not present

## 2022-08-14 DIAGNOSIS — I959 Hypotension, unspecified: Secondary | ICD-10-CM | POA: Diagnosis not present

## 2022-08-14 DIAGNOSIS — M793 Panniculitis, unspecified: Secondary | ICD-10-CM | POA: Diagnosis not present

## 2022-08-14 DIAGNOSIS — Z6841 Body Mass Index (BMI) 40.0 and over, adult: Secondary | ICD-10-CM | POA: Diagnosis not present

## 2022-08-14 LAB — PROTIME-INR
INR: 2.3 — ABNORMAL HIGH (ref 0.8–1.2)
Prothrombin Time: 25.3 seconds — ABNORMAL HIGH (ref 11.4–15.2)

## 2022-08-14 LAB — GLUCOSE, CAPILLARY: Glucose-Capillary: 111 mg/dL — ABNORMAL HIGH (ref 70–99)

## 2022-08-14 MED ORDER — SENNA 8.6 MG PO TABS
1.0000 | ORAL_TABLET | Freq: Every day | ORAL | Status: DC
  Administered 2022-08-14 – 2022-08-16 (×3): 8.6 mg via ORAL
  Filled 2022-08-14 (×3): qty 1

## 2022-08-14 MED ORDER — IOHEXOL 300 MG/ML  SOLN
100.0000 mL | Freq: Once | INTRAMUSCULAR | Status: AC | PRN
  Administered 2022-08-14: 100 mL via INTRAVENOUS

## 2022-08-14 MED ORDER — WARFARIN SODIUM 5 MG PO TABS
5.0000 mg | ORAL_TABLET | Freq: Once | ORAL | Status: AC
  Administered 2022-08-14: 5 mg via ORAL
  Filled 2022-08-14: qty 1

## 2022-08-14 MED ORDER — POLYETHYLENE GLYCOL 3350 17 G PO PACK
17.0000 g | PACK | Freq: Every day | ORAL | Status: DC
  Administered 2022-08-15 – 2022-08-16 (×2): 17 g via ORAL
  Filled 2022-08-14 (×2): qty 1

## 2022-08-14 MED ORDER — MEDIHONEY WOUND/BURN DRESSING EX PSTE
1.0000 | PASTE | Freq: Every day | CUTANEOUS | Status: DC
  Administered 2022-08-15 – 2022-08-16 (×2): 1 via TOPICAL

## 2022-08-14 NOTE — Progress Notes (Signed)
  Progress Note   Patient: Kayla Reyes NWG:[id number] DOB: Jul 12, 1950 DOA: 08/09/2022     4 DOS: the patient was seen and examined on 08/14/2022   Brief hospital course: Kayla Reyes is a 72 y.o. female with medical history significant for Rheumatoid arthritis on immunosuppression with Plaquenil, methotrexate and daily prednisone, A-fib on amiodarone and warfarin with history of DC cardioversion 04/2022, PAD s/p left AKA, morbid obesity, BMI 50-59.9, HTN, hypothyroidism, hospitalized from 3/26 to 05/19/2022 for septic shock secondary to infective panniculitis, who was brought in by EMS with a fever 101.6 associated with nonbloody nonbilious vomiting.  She denied abdominal pain or diarrhea.  She endorses pain of the abdominal wall at the site of prior panniculitis.  Denies dysuria, cough or shortness of breath or chest pain.  Patient is admitted with impression of sepsis, acute infective panniculitis, started on broad-spectrum antibiotics cefepime, vancomycin, flagyl. Later vanc stopped. Urine cultures positive for Klebsiella.  Assessment and Plan: Acute Infective panniculitis, recurrent, UTI At risk for severe infection due to chronic immunosuppressive treatment Fever, tachycardia improved. BP better, lactic trended down. Continue Cefepime+flagyl.  Continue Lasix for abdominal distention, chronic lymphedema. CT abdomen pelvis ordered due to worsening distention, pain.  Klebsiella Urinary tract infection Urine cultures positive for Klebsiella. Continue Cefepime.  Hypokalemia- Oral potassium supplements daily.  Rheumatoid arthritis (HCC) Restarted Plaquenil, methotrexate and prednisone  Hypotension Likely secondary to sepsis/severe sepsis Hold all antihypertensives and monitor BP closely.  Paroxysmal atrial fibrillation (HCC) Continue amiodarone and warfarin, pharmacy to adjust Warfarin, follow PT/ INR.  Hypothyroidism Continue levothyroxine  PAD with Hx of AKA (above knee  amputation), left (HCC) Prior history of embolism with occlusion of the Left external iliac artery, CFA and SFA s/p thrombectomy and thrombolysis which required subsequent L AKA in 2008. Continue warfarin therapy. Follow PT/ INR.  Morbid obesity - BMI 57 Diet, exercise and weight reduction advised. Bariatric surgery eval needed.  Constipation regimen ordered. Supportive care. DVT prophylaxis CODE STATUS -FULL CODE.     Subjective: Patient is seen and examined today morning.  She is lying in bed, feels abdomen distention worsened left side.  Has constipation. Physical Exam: Vitals:   08/13/22 0809 08/13/22 1606 08/13/22 2122 08/14/22 0630  BP: 97/60 98/60 123/85 105/68  Pulse: 73 79 75 71  Resp: 18 16 16 18   Temp: 98.9 F (37.2 C) 98.5 F (36.9 C) 98.1 F (36.7 C) 97.6 F (36.4 C)  TempSrc: Oral  Oral Oral  SpO2: 100% 99% 99% 100%  Weight:      Height:       General - Elderly morbidly obese African-American female, no apparent distress HEENT - PERRLA, EOMI, atraumatic head, non tender sinuses. Lung - Clear, diffuse Rales and rhonchi. Heart - S1, S2 heard, no murmurs, rubs, 2+ pedal edema. Abdomen soft, obese, large pannus with warmth, left > right swelling, redness noted. Neuro - Alert, awake oriented x3, non focal exam. Skin - Warm and dry. Data Reviewed:  CBC, BMP  Family Communication: Patient understands and agrees with current plan.  Disposition: Status is: Inpatient Remains inpatient appropriate because: abdominal pain, distension. IV antibiotic therapy, CT abdomen imaging.  Planned Discharge Destination: Home with Home Health    Time spent: 42 minutes  Author: [name], MD 08/14/2022 8:24 AM  For on call review [email or web address].

## 2022-08-14 NOTE — Consult Note (Signed)
ANTICOAGULATION CONSULT NOTE - Initial Consult  Pharmacy Consult for warfarin dosing Indication: atrial fibrillation  No Known Allergies  Patient Measurements: Height: 5\' 8"  (172.7 cm) Weight: (!) 170.1 kg (375 lb) IBW/kg (Calculated) : 63.9   Vital Signs: Temp: 97.9 F (36.6 C) (06/30 0834) Temp Source: Oral (06/30 0630) BP: 111/69 (06/30 0834) Pulse Rate: 73 (06/30 0834)  Labs: Recent Labs    08/12/22 0438 08/13/22 0418 08/14/22 0826  HGB 10.8* 10.5*  --   HCT 33.2* 32.7*  --   PLT 221 223  --   LABPROT 26.7* 26.2* 25.3*  INR 2.4* 2.4* 2.3*  CREATININE 0.83 0.78  --      Estimated Creatinine Clearance: 109.9 mL/min (by C-G formula based on SCr of 0.78 mg/dL).   Medical History: Past Medical History:  Diagnosis Date   Arterial thrombosis (HCC)    a. s/p thromboectomy and thrombolysis with left BKA in 2008 on chronic Coumadin    HLD (hyperlipidemia)    Hypertension    Hypothyroidism    Obesity    PAF (paroxysmal atrial fibrillation) (HCC)    a. noted 12/19 in the setting of hypokalemia; b. CHADS2VASc 4 (HTN, age x 1, vascular disease, female); c. on chronic Coumadin in the setting of prior arterial clot   Rheumatoid arthritis (HCC)     Medications:  Warfarin 10 mg once daily on Mon, Wed, Thurs, Fri Warfarin 5 mg once daily all other days  Drug-drug interactions: Amiodarone 200 mg daily - can increase INR. Continued on admission Hydroxychloroquine 400 mg daily - can prolong QT and increase INR. Holding inpatient Methotrexate 10 mg once weekly - can increase INR. Holding inpatient Cefepime 2 grams every 8 hours - increased therapeutic effect  Assessment: 72 yo female presents to ED due to fever and possible cellulitis of abdomen.  PHM includes Afib, RA, HTN, hypothyroidism, PAD and HLD.  Patient admitted for treatment of sepsis with infection source of panniculitis and/or UTI.  Pharmacy consulted to dose warfarin.  Chest xray showing pulmonary vascular  congestion  Goal of Therapy:  INR 2-3 Monitor platelets by anticoagulation protocol: Yes   Date INR Warfarin Dose  6/25 1.7 5 mg PTA  6/26 1.9 10 mg   6/27 2.1 5 mg   6/28 2.4 5 mg  6/29 2.4 5 mg  6/30 2.3 5 mg    Plan:  Give warfarin 5 mg x 1 again today. Can likely resume scheduled home regimen tomorrow INR daily while inpatient   Bettey Costa, PharmD Clinical Pharmacist 08/14/2022 10:05 AM

## 2022-08-15 DIAGNOSIS — Z6841 Body Mass Index (BMI) 40.0 and over, adult: Secondary | ICD-10-CM | POA: Diagnosis not present

## 2022-08-15 DIAGNOSIS — M793 Panniculitis, unspecified: Secondary | ICD-10-CM | POA: Diagnosis not present

## 2022-08-15 DIAGNOSIS — Z9189 Other specified personal risk factors, not elsewhere classified: Secondary | ICD-10-CM | POA: Diagnosis not present

## 2022-08-15 DIAGNOSIS — I959 Hypotension, unspecified: Secondary | ICD-10-CM | POA: Diagnosis not present

## 2022-08-15 LAB — BASIC METABOLIC PANEL
Anion gap: 7 (ref 5–15)
BUN: 16 mg/dL (ref 8–23)
CO2: 26 mmol/L (ref 22–32)
Calcium: 8.3 mg/dL — ABNORMAL LOW (ref 8.9–10.3)
Chloride: 105 mmol/L (ref 98–111)
Creatinine, Ser: 0.81 mg/dL (ref 0.44–1.00)
GFR, Estimated: >60 mL/min (ref >60)
Glucose, Bld: 104 mg/dL — ABNORMAL HIGH (ref 70–99)
Potassium: 4.4 mmol/L (ref 3.5–5.1)
Sodium: 138 mmol/L (ref 135–145)

## 2022-08-15 LAB — CBC
HCT: 38 % (ref 36.0–46.0)
Hemoglobin: 12.2 g/dL (ref 12.0–15.0)
MCH: 30.2 pg (ref 26.0–34.0)
MCHC: 32.1 g/dL (ref 30.0–36.0)
MCV: 94.1 fL (ref 80.0–100.0)
Platelets: 293 10*3/uL (ref 150–400)
RBC: 4.04 MIL/uL (ref 3.87–5.11)
RDW: 16.1 % — ABNORMAL HIGH (ref 11.5–15.5)
WBC: 4.6 10*3/uL (ref 4.0–10.5)
nRBC: 0 % (ref 0.0–0.2)

## 2022-08-15 LAB — CULTURE, BLOOD (ROUTINE X 2)
Culture: NO GROWTH
Special Requests: ADEQUATE

## 2022-08-15 LAB — PROTIME-INR
INR: 2.4 — ABNORMAL HIGH (ref 0.8–1.2)
Prothrombin Time: 26.3 seconds — ABNORMAL HIGH (ref 11.4–15.2)

## 2022-08-15 MED ORDER — PREDNISONE 1 MG PO TABS
3.0000 mg | ORAL_TABLET | Freq: Every day | ORAL | Status: DC
  Administered 2022-08-15 – 2022-08-16 (×2): 3 mg via ORAL
  Filled 2022-08-15 (×2): qty 3

## 2022-08-15 MED ORDER — FUROSEMIDE 10 MG/ML IJ SOLN
60.0000 mg | Freq: Every day | INTRAMUSCULAR | Status: DC
  Administered 2022-08-15 – 2022-08-16 (×2): 60 mg via INTRAVENOUS
  Filled 2022-08-15 (×2): qty 6

## 2022-08-15 MED ORDER — WARFARIN SODIUM 5 MG PO TABS
5.0000 mg | ORAL_TABLET | Freq: Once | ORAL | Status: AC
  Administered 2022-08-15: 5 mg via ORAL
  Filled 2022-08-15: qty 1

## 2022-08-15 MED ORDER — METOPROLOL SUCCINATE ER 25 MG PO TB24
25.0000 mg | ORAL_TABLET | Freq: Every day | ORAL | Status: DC
  Administered 2022-08-15 – 2022-08-16 (×2): 25 mg via ORAL
  Filled 2022-08-15 (×2): qty 1

## 2022-08-15 NOTE — Care Management Important Message (Signed)
Important Message  Patient Details  Name: Flonnie Felt MRN: [id number] Date of Birth: [birth year]   Medicare Important Message Given:  Yes     Olegario Messier A Chesni Vos 08/15/2022, 10:34 AM

## 2022-08-15 NOTE — Consult Note (Signed)
ANTICOAGULATION CONSULT NOTE  Pharmacy Consult for Warfarin Dosing Indication: atrial fibrillation  No Known Allergies  Patient Measurements: Height: 5\' 8"  (172.7 cm) Weight: (!) 170.1 kg (375 lb) IBW/kg (Calculated) : 63.9   Vital Signs: Temp: 97.5 F (36.4 C) (06/30 2042) Temp Source: Oral (06/30 2042) BP: 127/70 (06/30 2042) Pulse Rate: 74 (06/30 2042)  Labs: Recent Labs    08/13/22 0418 08/14/22 0826 08/15/22 0519  HGB 10.5*  --  12.2  HCT 32.7*  --  38.0  PLT 223  --  293  LABPROT 26.2* 25.3* 26.3*  INR 2.4* 2.3* 2.4*  CREATININE 0.78  --  0.81     Estimated Creatinine Clearance: 108.6 mL/min (by C-G formula based on SCr of 0.81 mg/dL).   Medical History: Past Medical History:  Diagnosis Date   Arterial thrombosis (HCC)    a. s/p thromboectomy and thrombolysis with left BKA in 2008 on chronic Coumadin    Diabetes mellitus without complication (HCC)    HLD (hyperlipidemia)    Hypertension    Hypothyroidism    Obesity    PAF (paroxysmal atrial fibrillation) (HCC)    a. noted 12/19 in the setting of hypokalemia; b. CHADS2VASc 4 (HTN, age x 1, vascular disease, female); c. on chronic Coumadin in the setting of prior arterial clot   Rheumatoid arthritis (HCC)    Medications:  PTA Warfarin: 55 mg/wk 10 mg MWThF 5 mg TuSaSu   Drug-drug interactions: Amiodarone 200 mg daily - can increase INR Hydroxychloroquine 400 mg daily - can prolong QT and increase INR Methotrexate 10 mg once weekly - can increase INR Cefepime 2 g Q8H - increased therapeutic effect  Assessment: Kayla Reyes is a 72 y.o. female presenting with acute infective panniculitis. PMH significant for AF, RA, HTN, hypothyroidism, PAD, HLD. Patient was on Warfarin PTA per chart review. Last dose of Warfarin PTA was on 08/08/2022 (5 mg). Of note, INR was 1.7 on home regimen on arrival. Pharmacy has been consulted to initiate and manage Warfarin.    Goal of Therapy:  INR 2-3 Monitor platelets  by anticoagulation protocol: Yes   Date INR Warfarin Dose  6/25 1.7 5 mg PTA  6/26 1.9 10 mg   6/27 2.1 5 mg   6/28 2.4 5 mg  6/29 2.4 5 mg  6/30 2.3 5 mg  7/1 2.4 5 mg    Plan:  Give warfarin 5 mg  x1 dose today  Check INR daily until stable Check CBC at least weekly while on warfarin   Celene Squibb, PharmD Clinical Pharmacist 08/15/2022 7:22 AM

## 2022-08-15 NOTE — Progress Notes (Signed)
  Progress Note   Patient: Kayla Reyes WGN:[id number] DOB: Dec 23, 1950 DOA: 08/09/2022     5 DOS: the patient was seen and examined on 08/15/2022   Brief hospital course: Tzipa Appiah is a 72 y.o. female with medical history significant for Rheumatoid arthritis on immunosuppression with Plaquenil, methotrexate and daily prednisone, A-fib on amiodarone and warfarin with history of DC cardioversion 04/2022, PAD s/p left AKA, morbid obesity, BMI 50-59.9, HTN, hypothyroidism, hospitalized from 3/26 to 05/19/2022 for septic shock secondary to infective panniculitis, who was brought in by EMS with a fever 101.6 associated with nonbloody nonbilious vomiting.  She denied abdominal pain or diarrhea.  She endorses pain of the abdominal wall at the site of prior panniculitis.  Denies dysuria, cough or shortness of breath or chest pain.  Patient is admitted with impression of sepsis, acute infective panniculitis, started on broad-spectrum antibiotics cefepime, vancomycin, flagyl. Later vanc stopped. Urine cultures positive for Klebsiella.  Assessment and Plan: Acute Infective panniculitis, recurrent, UTI At risk for severe infection due to chronic immunosuppressive treatment Fever, tachycardia improved. BP better, lactic trended down. Continue Cefepime+flagyl.  Resume IV Lasix for abdominal distention, chronic lymphedema. CT abdomen pelvis shows abdominal wall edema, no abscess.  Klebsiella Urinary tract infection Urine cultures positive for Klebsiella. Continue Cefepime.  Hypokalemia- Oral potassium supplements daily.  Rheumatoid arthritis (HCC) Restarted Plaquenil, methotrexate and prednisone  Hypotension Improved. Will resumed home Metoprolol therapy.  Paroxysmal atrial fibrillation (HCC) Continue amiodarone and warfarin, pharmacy to adjust Warfarin, follow PT/ INR.  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 Continue warfarin therapy. Follow PT/ INR.  Morbid obesity - BMI 57 Diet, exercise and weight reduction advised. Bariatric surgery eval needed.  Continue constipation regimen.. Supportive care. DVT prophylaxis CODE STATUS -FULL CODE.     Subjective: Patient is seen and examined today morning.  She is lying in bed, no new complaints.  Has constipation. Eating fair.  Physical Exam: Vitals:   08/14/22 1617 08/14/22 2042 08/15/22 0847 08/15/22 1144  BP: (!) 110/56 127/70 (!) 110/46 118/61  Pulse: 76 74 83 87  Resp: 18 18 16 16   Temp: 97.7 F (36.5 C) (!) 97.5 F (36.4 C) 98 F (36.7 C) 97.9 F (36.6 C)  TempSrc:  Oral Oral Oral  SpO2: 100% 100% 92% 96%  Weight:      Height:       General - Elderly morbidly obese African-American female, no apparent distress HEENT - PERRLA, EOMI, atraumatic head, non tender sinuses. Lung - Clear, diffuse Rales and rhonchi. Heart - S1, S2 heard, no murmurs, rubs, 2+ pedal edema. Abdomen soft, obese, large pannus with warmth, left > right swelling, redness noted. Neuro - Alert, awake oriented x3, non focal exam. Skin - Warm and dry. Data Reviewed:  CBC, BMP  Family Communication: Patient understands and agrees with current plan.  Disposition: Status is: Inpatient Remains inpatient appropriate because: abdominal pain, distension. IV antibiotic therapy, CT abdomen imaging.  Planned Discharge Destination: Home with Home Health    Time spent: 42 minutes  Author: [name], MD 08/15/2022 12:49 PM  For on call review [email or web address].

## 2022-08-16 DIAGNOSIS — A498 Other bacterial infections of unspecified site: Secondary | ICD-10-CM | POA: Diagnosis not present

## 2022-08-16 DIAGNOSIS — Z6841 Body Mass Index (BMI) 40.0 and over, adult: Secondary | ICD-10-CM | POA: Diagnosis not present

## 2022-08-16 DIAGNOSIS — Z9189 Other specified personal risk factors, not elsewhere classified: Secondary | ICD-10-CM | POA: Diagnosis not present

## 2022-08-16 DIAGNOSIS — M793 Panniculitis, unspecified: Secondary | ICD-10-CM | POA: Diagnosis not present

## 2022-08-16 LAB — PROTIME-INR
INR: 2.4 — ABNORMAL HIGH (ref 0.8–1.2)
Prothrombin Time: 26 seconds — ABNORMAL HIGH (ref 11.4–15.2)

## 2022-08-16 MED ORDER — WARFARIN SODIUM 5 MG PO TABS
5.0000 mg | ORAL_TABLET | Freq: Once | ORAL | Status: DC
  Filled 2022-08-16: qty 1

## 2022-08-16 MED ORDER — AMOXICILLIN-POT CLAVULANATE 500-125 MG PO TABS: 1.0000 | ORAL_TABLET | Freq: Two times a day (BID) | ORAL | 0 refills | Status: AC

## 2022-08-16 MED ORDER — FUROSEMIDE 40 MG PO TABS: ORAL_TABLET | ORAL | 2 refills | Status: DC

## 2022-08-16 MED ORDER — WARFARIN SODIUM 5 MG PO TABS: 5.0000 mg | ORAL_TABLET | Freq: Every day | ORAL | Status: AC

## 2022-08-16 NOTE — Progress Notes (Signed)
Patient being discharged home. PIV removed. Went over discharge instructions and medications with patient. Patient stated that she understood and all questions answered. Patient being transported home via EMS.

## 2022-08-16 NOTE — Consult Note (Signed)
ANTICOAGULATION CONSULT NOTE  Pharmacy Consult for Warfarin Dosing Indication: atrial fibrillation  No Known Allergies  Patient Measurements: Height: 5\' 8"  (172.7 cm) Weight: (!) 170.1 kg (375 lb) IBW/kg (Calculated) : 63.9   Vital Signs: Temp: 98 F (36.7 C) (07/02 0410) BP: 105/59 (07/02 0410) Pulse Rate: 66 (07/02 0410)  Labs: Recent Labs    08/14/22 0826 08/15/22 0519 08/16/22 0432  HGB  --  12.2  --   HCT  --  38.0  --   PLT  --  293  --   LABPROT 25.3* 26.3* 26.0*  INR 2.3* 2.4* 2.4*  CREATININE  --  0.81  --      Estimated Creatinine Clearance: 108.6 mL/min (by C-G formula based on SCr of 0.81 mg/dL).   Medical History: Past Medical History:  Diagnosis Date   Arterial thrombosis (HCC)    a. s/p thromboectomy and thrombolysis with left BKA in 2008 on chronic Coumadin    Diabetes mellitus without complication (HCC)    HLD (hyperlipidemia)    Hypertension    Hypothyroidism    Obesity    PAF (paroxysmal atrial fibrillation) (HCC)    a. noted 12/19 in the setting of hypokalemia; b. CHADS2VASc 4 (HTN, age x 1, vascular disease, female); c. on chronic Coumadin in the setting of prior arterial clot   Rheumatoid arthritis (HCC)    Medications:  PTA Warfarin: 55 mg/wk 10 mg MWThF 5 mg TuSaSu   Drug-drug interactions: Amiodarone 200 mg daily - can increase INR Hydroxychloroquine 400 mg daily - can prolong QT and increase INR Methotrexate 10 mg once weekly - can increase INR Cefepime 2 g Q8H - increased therapeutic effect  Assessment: Kayla Reyes is a 72 y.o. female presenting with acute infective panniculitis. PMH significant for AF, RA, HTN, hypothyroidism, PAD, HLD. Patient was on Warfarin PTA per chart review. Last dose of Warfarin PTA was on 08/08/2022 (5 mg). Of note, INR was 1.7 on home regimen on arrival. Pharmacy has been consulted to initiate and manage Warfarin.    Goal of Therapy:  INR 2-3 Monitor platelets by anticoagulation protocol: Yes    Date INR Warfarin Dose  6/25 1.7 5 mg PTA  6/26 1.9 10 mg   6/27 2.1 5 mg   6/28 2.4 5 mg  6/29 2.4 5 mg  6/30 2.3 5 mg  7/1 2.4 5 mg  7/2 2.4 5 mg    Plan:  Give warfarin 5 mg  x1 dose today  Check INR daily until stable Check CBC at least weekly while on warfarin   Celene Squibb, PharmD Clinical Pharmacist 08/16/2022 7:21 AM

## 2022-08-16 NOTE — Progress Notes (Signed)
OT Screen Note  Patient Details Name: Kayla Reyes MRN: [id number] DOB: 11/29/1950   Cancelled Treatment:    Reason Eval/Treat Not Completed: OT screened, no needs identified, will sign off. Pt currently at baseline, bed bound, hoyer lift, assist from family for all ADL. No new DME needs, pt reports family able to continue to provide assist for ADL. Pt declines any needs at this time. Will sign off. TOC notified.   Arman Filter., MPH, MS, OTR/L ascom [phone] 08/16/22, 12:00 PM

## 2022-08-16 NOTE — Discharge Summary (Signed)
Physician Discharge Summary   Patient: Kayla Reyes MRN: [id number] DOB: [birth year]  Admit date:     08/09/2022  Discharge date: 08/16/22  Discharge Physician: [name]   PCP: Leanna Sato, MD   Recommendations at discharge:    PCP in 1 week. Bariatric surgery evaluation.  Discharge Diagnoses: Principal Problem:   Sepsis (HCC) Active Problems:   Acute Infective panniculitis, recurrent.   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 *  Hospital Course: [name] is a 72 y.o. female with medical history significant for Rheumatoid arthritis on immunosuppression with Plaquenil, methotrexate and daily prednisone, A-fib on amiodarone and warfarin with history of DC cardioversion 04/2022, PAD s/p left AKA, morbid obesity, BMI 50-59.9, HTN, hypothyroidism, hospitalized from 3/26 to 05/19/2022 for septic shock secondary to infective panniculitis, who was brought in by EMS with a fever 101.6 associated with nonbloody nonbilious vomiting.  She denied abdominal pain or diarrhea.  She endorses pain of the abdominal wall at the site of prior panniculitis.  Denies dysuria, cough or shortness of breath or chest pain.    Patient is admitted with impression of sepsis, acute infective panniculitis, started on broad-spectrum antibiotics cefepime, vancomycin, flagyl. Later vanc stopped. Urine cultures positive for Klebsiella.  Patient has abdominal wall edema, no abscess on CT abdomen pelvis.  Patient is started on IV Lasix 60 mg daily for 3 days.  Per abdominal distention improved, remains afebrile.  Given her chronic  immunosuppressive treatment she is at high risk for infection.  Advised her to follow-up with PCP and get bariatric surgery evaluation for weight loss.  I advised her to take Lasix 40 mg twice daily for 1 week and then daily.  She is continued on Coumadin therapy with therapeutic INR.  Patient's home dose Plaquenil methotrexate and prednisone reinitiated.  Her metoprolol therapy continued along with levothyroxine.  Prescription for antibiotics sent to pharmacy.  Patient understands and agrees with the discharge plan.         Consultants: None Procedures performed: None Disposition: Home Diet recommendation:  Discharge Diet Orders (From admission, onward)     Start     Ordered   08/16/22 0000  Diet - low sodium heart healthy        08/16/22 1038           Cardiac diet DISCHARGE MEDICATION: Allergies as of 08/16/2022   No Known Allergies      Medication List     TAKE these medications    albuterol 108 (90 Base) MCG/ACT inhaler Commonly known as: VENTOLIN HFA Inhale 2 puffs into the lungs every 6 (six) hours as needed for wheezing or shortness of breath.   amiodarone 200 MG tablet Commonly known as: PACERONE Take 2 tablets (400 mg total) by mouth 2 (two) times daily. Then from 05/22/22--take 200 mg twice a day Then from 05/29/22--take 200 mg daily What changed:  how much to take when to take this additional instructions   amoxicillin-clavulanate 500-125 MG tablet Commonly known as: Augmentin Take 1 tablet by mouth 2 (two) times daily for 5 days.   ascorbic acid 500 MG tablet  Commonly known as: VITAMIN C Take 1 tablet (500 mg total) by mouth 2 (two) times daily.   docusate sodium 100 MG capsule Commonly known as: COLACE Take 1 capsule (100 mg total) by mouth 2 (two) times daily.   feeding supplement Liqd Take 237 mLs by mouth 3 (three) times daily between meals.   Flonase 50 MCG/ACT nasal spray Generic drug: fluticasone Place 1 spray into both nostrils daily as  needed for allergies or rhinitis.   folic acid 1 MG tablet Commonly known as: FOLVITE Take 1 mg by mouth daily.   furosemide 40 MG tablet Commonly known as: LASIX Take 1 tablet (40 mg total) by mouth 2 (two) times daily for 7 days, THEN 1 tablet (40 mg total) daily. Start taking on: August 16, 2022 What changed: See the new instructions.   gabapentin 300 MG capsule Commonly known as: NEURONTIN Take 600 mg by mouth 3 (three) times daily.   hydroxychloroquine 200 MG tablet Commonly known as: PLAQUENIL Take 400 mg by mouth daily.   Jardiance 10 MG Tabs tablet Generic drug: empagliflozin Take 10 mg by mouth daily.   leptospermum manuka honey Pste paste Apply 1 Application topically daily.   levothyroxine 137 MCG tablet Commonly known as: SYNTHROID Take 1 tablet (137 mcg total) by mouth daily before breakfast. What changed: how much to take   liver oil-zinc oxide 40 % ointment Commonly known as: DESITIN Apply topically 3 (three) times daily.   lovastatin 20 MG tablet Commonly known as: MEVACOR Take 20 mg by mouth every evening.   methotrexate 2.5 MG tablet Commonly known as: RHEUMATREX Take 10 mg by mouth once a week.   metoprolol tartrate 25 MG tablet Commonly known as: LOPRESSOR Take 25 mg by mouth daily.   polyethylene glycol 17 g packet Commonly known as: MIRALAX / GLYCOLAX Take 17 g by mouth daily.   potassium chloride 10 MEQ tablet Commonly known as: KLOR-CON M Take 10 mEq by mouth daily.   predniSONE 1 MG tablet Commonly known as: DELTASONE Take 3 mg by mouth daily with breakfast.   Tradjenta 5 MG Tabs tablet Generic drug: linagliptin Take 5 mg by mouth daily.   Vitamin D (Ergocalciferol) 1.25 MG (50000 UNIT) Caps capsule Commonly known as: DRISDOL Take 50,000 Units by mouth once a week.   warfarin 5 MG tablet Commonly known as: COUMADIN Take 1 tablet (5 mg total) by mouth daily at 4 PM. Take 10 mg once daily on Mon, Wed, Thurs, Fri. Take 5 mg once  daily all other days. What changed: how much to take        Discharge Exam: Filed Weights   08/09/22 2357  Weight: (!) 170.1 kg   General - Elderly morbidly obese African-American female, no apparent distress HEENT - PERRLA, EOMI, atraumatic head, non tender sinuses. Lung - Clear, diffuse Rales and rhonchi. Heart - S1, S2 heard, no murmurs, rubs, 2+ pedal edema. Abdomen soft, obese, large pannus with warmth, left > right swelling improved. Neuro - Alert, awake oriented x3, non focal exam. Skin - Warm and dry.  Condition at discharge: stable  The results of significant diagnostics from this hospitalization (including imaging, microbiology, ancillary and laboratory) are listed below for reference.   Imaging Studies: CT ABDOMEN PELVIS W WO CONTRAST  Result Date: 08/14/2022 CLINICAL DATA:  Abdominal pain, panniculitis, possible constipation EXAM: CT ABDOMEN AND PELVIS WITHOUT AND WITH CONTRAST TECHNIQUE: Multidetector CT imaging of the abdomen and pelvis was performed following the standard protocol before and  following the bolus administration of intravenous contrast. RADIATION DOSE REDUCTION: This exam was performed according to the departmental dose-optimization program which includes automated exposure control, adjustment of the mA and/or kV according to patient size and/or use of iterative reconstruction technique. CONTRAST:  OMNIPAQUE IOHEXOL 300 MG/ML  SOLN COMPARISON:  05/12/2022 FINDINGS: Lower chest: No acute abnormality.  Small hiatal hernia. Hepatobiliary: No solid liver abnormality is seen. Hepatic steatosis. Numerous small gallstones. No wall thickening, or biliary dilatation. Pancreas: Unremarkable. No pancreatic ductal dilatation or surrounding inflammatory changes. Spleen: Normal in size without significant abnormality. Adrenals/Urinary Tract: Adrenal glands are unremarkable. Kidneys are normal, without renal calculi, solid lesion, or hydronephrosis. Bladder is  unremarkable. Stomach/Bowel: Stomach is within normal limits. Appendix appears normal. No evidence of bowel wall thickening, distention, or inflammatory changes. Descending and sigmoid diverticulosis. Moderate burden of stool throughout the colon and rectum. Vascular/Lymphatic: Aortic atherosclerosis. No enlarged abdominal or pelvic lymph nodes. Reproductive: Calcified uterine fibroids. Other: No abdominal wall hernia. Diffuse skin thickening and subcutaneous edema involving the pannus, which is incompletely imaged due to large body habitus and limited field of view (series 7, image 138). No ascites. Musculoskeletal: No acute or significant osseous findings. IMPRESSION: 1. Diffuse skin thickening and subcutaneous edema involving the pannus, which is incompletely imaged due to large body habitus and limited field of view. No focal fluid collection 2. No abdominal wall hernia. 3. Descending and sigmoid diverticulosis without evidence of acute diverticulitis. 4. Moderate burden of stool throughout the colon and rectum. 5. Hepatic steatosis. 6. Cholelithiasis. Aortic Atherosclerosis (ICD10-I70.0). Electronically Signed   By: Jearld Lesch M.D.   On: 08/14/2022 15:23   DG Chest 1 View  Result Date: 08/09/2022 CLINICAL DATA:  Fever and vomiting. EXAM: CHEST  1 VIEW COMPARISON:  May 12, 2022 FINDINGS: The cardiac silhouette is mildly enlarged and unchanged in size. There is marked severity calcification of the aortic arch. Stable, mild to moderate severity diffusely increased interstitial lung markings are seen with mild prominence of the pulmonary vasculature. Mild atelectasis is noted within the right lung base. No pleural effusion or pneumothorax is identified. Multilevel degenerative changes seen throughout the thoracic spine. IMPRESSION: Stable cardiomegaly with chronic pulmonary vascular congestion and mild right basilar atelectasis. Electronically Signed   By: Aram Candela M.D.   On: 08/09/2022 23:41     Microbiology: Results for orders placed or performed during the hospital encounter of 08/09/22  Blood Culture (routine x 2)     Status: None (Preliminary result)   Collection Time: 08/09/22 11:43 PM   Specimen: BLOOD  Result Value Ref Range Status   Specimen Description   Final    BLOOD BLOOD RIGHT ARM Performed at [facility], [address], [place], [place] [address]    Special Requests   Final    BOTTLES DRAWN AEROBIC AND ANAEROBIC Blood Culture results may not be optimal due to an inadequate volume of blood received in culture bottles Performed at [facility], [address] [place], [place] [address]    Culture  Setup Time   Final    ANAEROBIC BOTTLE ONLY GRAM NEGATIVE RODS Organism ID to follow CRITICAL RESULT CALLED TO, READ BACK BY AND VERIFIED WITH: CAROLYN COULTER AT 1424 08/11/22 JG Performed at [facility], [address] [place], [place] [address]    Culture   Final    Romie Minus NEGATIVE RODS Sammuel Hines FOR IDENTIFICATION AND SUSCEPTIBILITIES Performed at [facility], [address], [place], [place] [address]  Report Status PENDING  Incomplete  Blood Culture ID Panel (Reflexed)     Status: None   Collection Time: 08/09/22 11:43 PM  Result Value Ref Range Status   Enterococcus faecalis NOT DETECTED NOT DETECTED Final   Enterococcus Faecium NOT DETECTED NOT DETECTED Final   Listeria monocytogenes NOT DETECTED NOT DETECTED Final   Staphylococcus species NOT DETECTED NOT DETECTED Final   Staphylococcus aureus (BCID) NOT DETECTED NOT DETECTED Final   Staphylococcus epidermidis NOT DETECTED NOT DETECTED Final   Staphylococcus lugdunensis NOT DETECTED NOT DETECTED Final   Streptococcus species NOT DETECTED NOT DETECTED Final   Streptococcus agalactiae NOT DETECTED NOT DETECTED Final   Streptococcus pneumoniae NOT DETECTED NOT DETECTED Final   Streptococcus pyogenes NOT DETECTED NOT DETECTED Final    A.calcoaceticus-baumannii NOT DETECTED NOT DETECTED Final   Bacteroides fragilis NOT DETECTED NOT DETECTED Final   Enterobacterales NOT DETECTED NOT DETECTED Final   Enterobacter cloacae complex NOT DETECTED NOT DETECTED Final   Escherichia coli NOT DETECTED NOT DETECTED Final   Klebsiella aerogenes NOT DETECTED NOT DETECTED Final   Klebsiella oxytoca NOT DETECTED NOT DETECTED Final   Klebsiella pneumoniae NOT DETECTED NOT DETECTED Final   Proteus species NOT DETECTED NOT DETECTED Final   Salmonella species NOT DETECTED NOT DETECTED Final   Serratia marcescens NOT DETECTED NOT DETECTED Final   Haemophilus influenzae NOT DETECTED NOT DETECTED Final   Neisseria meningitidis NOT DETECTED NOT DETECTED Final   Pseudomonas aeruginosa NOT DETECTED NOT DETECTED Final   Stenotrophomonas maltophilia NOT DETECTED NOT DETECTED Final   Candida albicans NOT DETECTED NOT DETECTED Final   Candida auris NOT DETECTED NOT DETECTED Final   Candida glabrata NOT DETECTED NOT DETECTED Final   Candida krusei NOT DETECTED NOT DETECTED Final   Candida parapsilosis NOT DETECTED NOT DETECTED Final   Candida tropicalis NOT DETECTED NOT DETECTED Final   Cryptococcus neoformans/gattii NOT DETECTED NOT DETECTED Final    Comment: Performed at [facility], [address] [place], [place] [address]  Blood Culture (routine x 2)     Status: None   Collection Time: 08/09/22 11:44 PM   Specimen: BLOOD  Result Value Ref Range Status   Specimen Description BLOOD BLOOD LEFT ARM  Final   Special Requests   Final    BOTTLES DRAWN AEROBIC AND ANAEROBIC Blood Culture adequate volume   Culture   Final    NO GROWTH 5 DAYS Performed at [facility], [address] [place], [place] [address]    Report Status 08/15/2022 FINAL  Final  Urine Culture     Status: Abnormal   Collection Time: 08/09/22 11:44 PM   Specimen: Urine, Random  Result Value Ref Range Status   Specimen Description   Final    URINE,  RANDOM Performed at [facility], [address] [place], [place] [address]    Special Requests   Final    NONE Reflexed from [id number] Performed at [facility], [address] [place], [place] [address]    Culture >=100,000 COLONIES/mL KLEBSIELLA PNEUMONIAE (A)  Final   Report Status 08/12/2022 FINAL  Final   Organism ID, Bacteria KLEBSIELLA PNEUMONIAE (A)  Final      Susceptibility   Klebsiella pneumoniae - MIC*    AMPICILLIN RESISTANT Resistant     CEFAZOLIN <=4 SENSITIVE Sensitive     CEFEPIME <=0.12 SENSITIVE Sensitive     CEFTRIAXONE <=0.25 SENSITIVE Sensitive     CIPROFLOXACIN <=0.25 SENSITIVE Sensitive     GENTAMICIN <=1  SENSITIVE Sensitive     IMIPENEM <=0.25 SENSITIVE Sensitive     NITROFURANTOIN 64 INTERMEDIATE Intermediate     TRIMETH/SULFA <=20 SENSITIVE Sensitive     AMPICILLIN/SULBACTAM <=2 SENSITIVE Sensitive     PIP/TAZO <=4 SENSITIVE Sensitive     * >=100,000 COLONIES/mL KLEBSIELLA PNEUMONIAE    Labs: CBC: Recent Labs  Lab 08/09/22 1818 08/10/22 0535 08/12/22 0438 08/13/22 0418 08/15/22 0519  WBC 7.7 7.4 6.6 7.3 4.6  NEUTROABS 6.7  --   --   --   --   HGB 13.0 12.0 10.8* 10.5* 12.2  HCT 40.5 37.8 33.2* 32.7* 38.0  MCV 95.3 95.0 94.1 94.8 94.1  PLT 230 211 221 223 293    Basic Metabolic Panel: Recent Labs  Lab 08/09/22 1818 08/10/22 0535 08/12/22 0438 08/13/22 0418 08/15/22 0519  NA 134* 138 139 140 138  K 3.5 3.6 3.1* 3.5 4.4  CL 100 104 105 108 105  CO2 23 25 25 26 26   GLUCOSE 144* 112* 106* 109* 104*  BUN 15 14 13 13 16   CREATININE 0.99 1.08* 0.83 0.78 0.81  CALCIUM 8.3* 8.3* 8.0* 7.9* 8.3*    Liver Function Tests: Recent Labs  Lab 08/09/22 1818  AST 15  ALT 12  ALKPHOS 35*  BILITOT 1.0  PROT 6.7  ALBUMIN 2.9*    CBG: Recent Labs  Lab 08/14/22 2102  GLUCAP 111*     Discharge time spent: greater than 30 minutes.  Signed: [name], MD [facility] 08/16/2022

## 2022-08-16 NOTE — Progress Notes (Signed)
PT Cancellation Note  Patient Details Name: Kayla Reyes MRN: [id number] DOB: 06/21/1950   Cancelled Treatment:    Reason Eval/Treat Not Completed: Other (comment): Per OT, patient screened and no needs identified. Patient is at functional baseline, bed bound, hoyer lift, and assist from family ADLs. Will sign off, no PT acute care needs.   Howie Ill, PT, DPT 08/16/22 12:51 PM

## 2022-08-16 NOTE — TOC CM/SW Note (Signed)
Transition of Care [facility]) - Inpatient Brief Assessment   Patient Details  Name: Kayla Reyes MRN: [id number] Date of Birth: [birth year]  Transition of [facility]) CM/SW Contact:    Allena Katz, LCSW Phone Number: 08/16/2022, 12:41 PM   Clinical Narrative:  No TOC needs. EMS called patient is second on the list.   Transition of Care Asessment: Insurance and Status: Insurance coverage has been reviewed Patient has primary care physician: Yes Home environment has been reviewed: [address] [place] [address] Prior level of function:: No PT/OT needs. Prior/Current Home Services: No current home services Social Determinants of Health Reivew: SDOH reviewed no interventions necessary Readmission risk has been reviewed: Yes Transition of care needs: no transition of care needs at this time

## 2022-08-17 LAB — AEROBIC ID BY MALDI

## 2022-08-17 LAB — ORGANISM ID BY MALDI, REFLX AST

## 2022-08-29 ENCOUNTER — Telehealth: Payer: Self-pay | Admitting: Cardiovascular Disease

## 2022-08-29 MED ORDER — AMIODARONE HCL 200 MG PO TABS: 200.0000 mg | ORAL_TABLET | Freq: Every day | ORAL | 0 refills | Status: DC

## 2022-08-29 NOTE — Telephone Encounter (Signed)
Requested Prescriptions   Signed Prescriptions Disp Refills   amiodarone (PACERONE) 200 MG tablet 90 tablet 0    Sig: Take 1 tablet (200 mg total) by mouth daily.    Authorizing Provider: Lorine Bears A    Ordering User: Thayer Headings, Maudean Hoffmann L

## 2022-08-29 NOTE — Telephone Encounter (Signed)
*  STAT* If patient is at the pharmacy, call can be transferred to refill team.   1. Which medications need to be refilled? (please list name of each medication and dose if known) amiodarone (PACERONE) 200 MG tablet   2. Which pharmacy/location (including street and city if local pharmacy) is medication to be sent to?   [id number] - [place], [place] - [address] AT [facility] OF [address]    3. Do they need a 30 day or 90 day supply? 90

## 2022-09-02 LAB — CULTURE, BLOOD (ROUTINE X 2)

## 2022-09-29 ENCOUNTER — Encounter: Payer: Self-pay | Admitting: Cardiovascular Disease

## 2022-09-29 ENCOUNTER — Ambulatory Visit: Payer: Medicare HMO | Attending: Cardiovascular Disease | Admitting: Cardiovascular Disease

## 2022-09-29 ENCOUNTER — Encounter: Payer: Self-pay | Admitting: *Deleted

## 2022-09-29 VITALS — BP 132/68 | HR 61 | Ht 67.0 in

## 2022-09-29 DIAGNOSIS — I10 Essential (primary) hypertension: Secondary | ICD-10-CM

## 2022-09-29 DIAGNOSIS — I4819 Other persistent atrial fibrillation: Secondary | ICD-10-CM | POA: Diagnosis not present

## 2022-09-29 DIAGNOSIS — I5032 Chronic diastolic (congestive) heart failure: Secondary | ICD-10-CM | POA: Diagnosis not present

## 2022-09-29 DIAGNOSIS — I480 Paroxysmal atrial fibrillation: Secondary | ICD-10-CM

## 2022-09-29 NOTE — Progress Notes (Signed)
Cardiology Office Note   Date:  09/29/2022   ID:  Kayla Reyes, DOB July 18, 1950, MRN [id number]  PCP:  Kayla Sato, MD  Cardiologist:   Kayla Bears, MD   Chief Complaint  Patient presents with   Follow-up    AFIB no complaints today. Meds reviewed verbally with pt.      History of Present Illness: Kayla Reyes is a 72 y.o. female who presents for a follow-up visit regarding persistent atrial fibrillation currently maintaining sinus rhythm on amiodarone.  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  Her current BMI is 58 and she is mostly wheelchair-bound. She is on long-term anticoagulation with warfarin which is managed by her primary care physician. Echocardiogram done in December 2019 showed an EF of 55 to 60%.  She was hospitalized in March with unresponsiveness and was found to have respiratory failure and possible underlying sepsis with elevated lactic acid.  She had atrial fibrillation with RVR that required amiodarone drip.  She had successful cardioversion but went back into atrial fibrillation and then reverted back to sinus rhythm with amiodarone.  She has been maintaining in sinus rhythm since then. She is reasonably well with no chest pain or worsening dyspnea.  No palpitations.  Past Medical History:  Diagnosis Date   Arterial thrombosis (HCC)    a. s/p thromboectomy and thrombolysis with left BKA in 2008 on chronic Coumadin    Diabetes mellitus without complication (HCC)    HLD (hyperlipidemia)    Hypertension    Hypothyroidism    Obesity    PAF (paroxysmal atrial fibrillation) (HCC)    a. noted 12/19 in the setting of hypokalemia; b. CHADS2VASc 4 (HTN, age x 1, vascular disease, female); c. on chronic Coumadin in the setting  of prior arterial clot   Rheumatoid arthritis (HCC)     Past Surgical History:  Procedure Laterality Date   BELOW KNEE LEG AMPUTATION     CARDIOVERSION N/A 05/13/2022   Procedure: CARDIOVERSION;  Surgeon: Antonieta Iba, MD;  Location: [facility] ORS;  Service: Cardiovascular;  Laterality: N/A;     Current Outpatient Medications  Medication Sig Dispense Refill   albuterol (PROVENTIL HFA;VENTOLIN HFA) 108 (90 Base) MCG/ACT inhaler Inhale 2 puffs into the lungs every 6 (six) hours as needed for wheezing or shortness of breath.     amiodarone (PACERONE) 200 MG tablet Take 1 tablet (200 mg total) by mouth daily. 90 tablet 0   ascorbic acid (VITAMIN C) 500 MG tablet Take 1 tablet (500 mg total) by mouth 2 (two) times daily. 30 tablet 0   docusate sodium (COLACE) 100 MG capsule Take 1 capsule (100 mg total) by mouth 2 (two) times daily. 10 capsule 0   feeding supplement (ENSURE ENLIVE / ENSURE PLUS) LIQD Take 237 mLs by mouth 3 (three) times daily between meals. 237 mL 12   fluticasone (FLONASE) 50 MCG/ACT nasal spray Place 1 spray into both nostrils daily as needed for allergies or rhinitis.     folic acid (FOLVITE) 1 MG tablet Take 1 mg by mouth daily.     furosemide (LASIX) 40 MG tablet Take 1 tablet (40 mg total) by mouth 2 (two) times daily for 7 days, THEN 1 tablet (40 mg total) daily. 30 tablet 2   gabapentin (NEURONTIN) 300 MG capsule Take 600 mg  by mouth 3 (three) times daily.     hydroxychloroquine (PLAQUENIL) 200 MG tablet Take 400 mg by mouth daily.     JARDIANCE 10 MG TABS tablet Take 10 mg by mouth daily.     levothyroxine (SYNTHROID) 150 MCG tablet Take 150 mcg by mouth daily before breakfast.     liver oil-zinc oxide (DESITIN) 40 % ointment Apply topically 3 (three) times daily. 56.7 g 0   lovastatin (MEVACOR) 20 MG tablet Take 20 mg by mouth every evening.     methotrexate (RHEUMATREX) 2.5 MG tablet Take 10 mg by mouth once a week.     metoprolol tartrate (LOPRESSOR) 25 MG tablet  Take 25 mg by mouth daily.     polyethylene glycol (MIRALAX / GLYCOLAX) 17 g packet Take 17 g by mouth daily. 14 each 0   potassium chloride (KLOR-CON M) 10 MEQ tablet Take 10 mEq by mouth daily.     predniSONE (DELTASONE) 1 MG tablet Take 3 mg by mouth daily with breakfast.     Vitamin D, Ergocalciferol, (DRISDOL) 1.25 MG (50000 UNIT) CAPS capsule Take 50,000 Units by mouth once a week.     warfarin (COUMADIN) 5 MG tablet Take 1 tablet (5 mg total) by mouth daily at 4 PM. Take 10 mg once daily on Mon, Wed, Thurs, Fri. Take 5 mg once daily all other days. 30 tablet    leptospermum manuka honey (MEDIHONEY) PSTE paste Apply 1 Application topically daily. (Patient not taking: Reported on 08/10/2022) 44 mL 1   TRADJENTA 5 MG TABS tablet Take 5 mg by mouth daily. (Patient not taking: Reported on 09/29/2022)     No current facility-administered medications for this visit.    Allergies:   Patient has no known allergies.    Social History:  The patient  reports that she has quit smoking. She has never used smokeless tobacco. She reports that she does not currently use alcohol. She reports that she does not currently use drugs.   Family History:  The patient's family history includes Arthritis in her mother; Cancer in her father; Hypertension in her mother; Thyroid disease in her father.    ROS:  Please see the history of present illness.   Otherwise, review of systems are positive for none.   All other systems are reviewed and negative.    PHYSICAL EXAM: VS:  BP 132/68 (BP Location: Left Arm, Patient Position: Sitting, Cuff Size: Large)   Pulse 61   Ht 5\' 7"  (1.702 m)   SpO2 98%   BMI 58.73 kg/m  , BMI Body mass index is 58.73 kg/m. GEN: Morbidly obese patient in no acute distress. HEENT: normal  Neck: no JVD, carotid bruits, or masses Cardiac: RRR; no murmurs, rubs, or gallops,no edema  Respiratory:  clear to auscultation bilaterally, normal work of breathing GI: soft, nontender,  nondistended, + BS MS: no deformity or atrophy  Skin: warm and dry, no rash Neuro:  Strength and sensation are intact Psych: euthymic mood, full affect   EKG:  EKG is ordered today. The ekg ordered today demonstrates:  Normal sinus rhythm Low voltage QRS Nonspecific T wave abnormality    Recent Labs: 05/10/2022: B Natriuretic Peptide 103.2; TSH 0.971 05/17/2022: Magnesium 2.3 08/09/2022: ALT 12 08/15/2022: BUN 16; Creatinine, Ser 0.81; Hemoglobin 12.2; Platelets 293; Potassium 4.4; Sodium 138    Lipid Panel No results found for: "CHOL", "TRIG", "HDL", "CHOLHDL", "VLDL", "LDLCALC", "LDLDIRECT"    Wt Readings from Last 3 Encounters:  08/09/22 (!) 375 lb (  170.1 kg)  05/19/22 (!) 373 lb 10.9 oz (169.5 kg)  02/19/18 (!) 399 lb (181 kg)           No data to display            ASSESSMENT AND PLAN:  1.  Persistent atrial fibrillation: Currently maintaining in sinus rhythm with amiodarone which should be continued given that she is at high risk for recurrent atrial fibrillation.  Continue long-term anticoagulation with warfarin with a target INR between 2 and 3.  This is being managed by her primary care physician.    2.  Chronic diastolic heart failure: Volume status is difficult to evaluate given morbid obesity but she does appear to be euvolemic on current dose of furosemide 40 mg daily and Jardiance 10 mg daily.  3.  Chronic venous insufficiency: Stable.  4.  Essential hypertension: Blood pressure is reasonably controlled.  5.  History of arterial embolism status post left BKA: On long-term anticoagulation with warfarin.  6.  Morbid obesity: She is wheelchair-bound due to morbid obesity and previous left BKA.    Disposition:   FU in 6 months  Signed,  Kayla Bears, MD  09/29/2022 2:54 PM    [facility]

## 2022-09-29 NOTE — Patient Instructions (Signed)
Medication Instructions:  No changes *If you need a refill on your cardiac medications before your next appointment, please call your pharmacy*   Lab Work: None ordered If you have labs (blood work) drawn today and your tests are completely normal, you will receive your results only by: MyChart Message (if you have MyChart) OR A paper copy in the mail If you have any lab test that is abnormal or we need to change your treatment, we will call you to review the results.   Testing/Procedures: None ordered   Follow-Up: At [facility], you and your health needs are our priority.  As part of our continuing mission to provide you with exceptional heart care, we have created designated Provider Care Teams.  These Care Teams include your primary Cardiologist (physician) and Advanced Practice Providers (APPs -  Physician Assistants and Nurse Practitioners) who all work together to provide you with the care you need, when you need it.  We recommend signing up for the patient portal called "MyChart".  Sign up information is provided on this After Visit Summary.  MyChart is used to connect with patients for Virtual Visits (Telemedicine).  Patients are able to view lab/test results, encounter notes, upcoming appointments, etc.  Non-urgent messages can be sent to your provider as well.   To learn more about what you can do with MyChart, go to [email or web address].    Your next appointment:   6 month(s)  Provider:   You may see Muhammad Arida, MD or one of the following Advanced Practice Providers on your designated Care Team:   Christopher Berge, NP Ryan Dunn, PA-C Cadence Furth, PA-C Sheri Hammock, NP    

## 2022-11-25 ENCOUNTER — Other Ambulatory Visit: Payer: Self-pay | Admitting: Cardiovascular Disease

## 2023-02-19 ENCOUNTER — Other Ambulatory Visit: Payer: Self-pay | Admitting: Cardiovascular Disease

## 2023-02-21 NOTE — Telephone Encounter (Signed)
 Last office visit: 09/29/22 with plan to f/u in 6 weeks.   Next visit: none/active recall

## 2023-05-18 ENCOUNTER — Other Ambulatory Visit: Payer: Self-pay | Admitting: Cardiovascular Disease

## 2023-05-18 NOTE — Telephone Encounter (Signed)
 Hi,  Could you please schedule this patient an overdue 6 month follow up visit? The patient was last seen on 09-29-22 by Dr. Kirke Corin. Thank you so much.

## 2023-06-07 ENCOUNTER — Ambulatory Visit: Admitting: Cardiology

## 2023-06-16 ENCOUNTER — Ambulatory Visit: Attending: Cardiology | Admitting: Physician Assistant

## 2023-06-16 ENCOUNTER — Encounter: Payer: Self-pay | Admitting: Physician Assistant

## 2023-06-16 VITALS — BP 136/76 | HR 67 | Ht 67.0 in | Wt 375.0 lb

## 2023-06-16 DIAGNOSIS — I4819 Other persistent atrial fibrillation: Secondary | ICD-10-CM | POA: Diagnosis not present

## 2023-06-16 DIAGNOSIS — Z89512 Acquired absence of left leg below knee: Secondary | ICD-10-CM

## 2023-06-16 DIAGNOSIS — I10 Essential (primary) hypertension: Secondary | ICD-10-CM

## 2023-06-16 DIAGNOSIS — I5032 Chronic diastolic (congestive) heart failure: Secondary | ICD-10-CM

## 2023-06-16 DIAGNOSIS — Z79899 Other long term (current) drug therapy: Secondary | ICD-10-CM

## 2023-06-16 DIAGNOSIS — Z86718 Personal history of other venous thrombosis and embolism: Secondary | ICD-10-CM

## 2023-06-16 DIAGNOSIS — I872 Venous insufficiency (chronic) (peripheral): Secondary | ICD-10-CM

## 2023-06-16 NOTE — Progress Notes (Signed)
 Cardiology Office Note    Date:  06/16/2023   ID:  Kayla Reyes, DOB [birth year], MRN [id number]  PCP:  Macie Saxon, MD  Cardiologist:  Antionette Kirks, MD  Electrophysiologist:  None   Chief Complaint: Follow up  History of Present Illness:   Kayla Reyes is a 73 y.o. female with history of persistent atrial fibrillation, history of urinary was a month lesion of the left external iliac artery, common femoral artery, and SFA status post thrombectomy and thrombolysis with subsequent left BKA in 2008 on chronic Coumadin  managed by her PCP, chronic diastolic heart failure, rheumatoid arthritis, osteoarthritis, essential hypertension, hyperlipidemia, morbid obesity, and hypothyroidism presents for follow up on atrial fibrillation, diastolic heart failure, hypertension, and hyperlipidemia.    Prior echo done 01/2018 showed EF of 55 to 60%.  Emergency department visit 04/2022 for unresponsiveness.  Per EMS patient was oriented to person and event which is different from her baseline.  She was experiencing chest pain, shortness of breath, abdominal pain, nausea, and vomiting.  She had audible wheezing and appeared mildly dyspneic was baseline.  Initially requiring admission to the ICU for severe sepsis and requiring pressors.  She was found to be in atrial fibrillation with RVR.  Underwent DCCV 05/13/2022.  Unfortunately, converted back to atrial fibrillation.  Started on amiodarone  with subsequent conversion to sinus rhythm.  Workup was overall unrevealing aside from infection under patient's large pannus.  She was treated with broad-spectrum antibiotics, weaned off pressors, and discharged 05/19/2022.  She was seen in follow-up 06/22/2022 and was overall doing well from a cardiac perspective.  She was maintaining sinus rhythm.  No further testing or medication changes were indicated at that time.  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  She was treated with  broad-spectrum antibiotics.  Noted to have abdominal wall edema which was treated with IV Lasix .   Patient was most recently seen by Dr. Alvenia Aus 09/29/2022 and was overall doing well from a cardiac perspective without chest pain, dyspnea, or palpitations.  She was maintaining sinus rhythm.  No further testing or medication changes were indicated at that time.  Today, patient reports feeling well. She does endorse rare episodes of "heart fluttering." She notes that her lower leg does swell intermittently which is resolved by elevating them.  She also wears compression stockings to help with chronic venous insufficiency. She denies chest pain, lightheadedness, dizziness, shortness of breath, orthopnea, bleeding, and hematochezia. She is compliant with her medications and without adverse effects.  Labs independently reviewed: 06/14/2023-CR 1.0, normal LFTs, Hgb 14.6 HCT 44.8 08/16/2022- INR 2.4 08/15/2022-K 4.4, BUN 16, Cr 0.81, Na 138 05/10/2022- TSH 0.971  Past Medical History:  Diagnosis Date   Arterial thrombosis (HCC)    a. 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 noted 12/19 in the setting of hypokalemia; b. CHADS2VASc 4 (HTN, age x 1, vascular disease, female); c. on chronic Coumadin  in the setting of prior arterial clot   Rheumatoid arthritis (HCC)     Past Surgical History:  Procedure Laterality Date   BELOW KNEE LEG AMPUTATION     CARDIOVERSION N/A 05/13/2022   Procedure: CARDIOVERSION;  Surgeon: Devorah Fonder, MD;  Location: [facility] ORS;  Service: Cardiovascular;  Laterality: N/A;  Current Medications: Current Meds  Medication Sig   albuterol  (PROVENTIL  HFA;VENTOLIN  HFA) 108 (90 Base) MCG/ACT inhaler Inhale 2 puffs into the lungs every 6 (six) hours as needed for wheezing or  shortness of breath.   amiodarone  (PACERONE ) 200 MG tablet TAKE 1 TABLET(200 MG) BY MOUTH DAILY   ascorbic acid  (VITAMIN C ) 500 MG tablet Take 1 tablet (500 mg total) by mouth 2 (two) times daily.   docusate sodium  (COLACE) 100 MG capsule Take 1 capsule (100 mg total) by mouth 2 (two) times daily.   feeding supplement (ENSURE ENLIVE / ENSURE PLUS) LIQD Take 237 mLs by mouth 3 (three) times daily between meals.   fluticasone  (FLONASE ) 50 MCG/ACT nasal spray Place 1 spray into both nostrils daily as needed for allergies or rhinitis.   folic acid  (FOLVITE ) 1 MG tablet Take 1 mg by mouth daily.   gabapentin  (NEURONTIN ) 300 MG capsule Take 600 mg by mouth 3 (three) times daily.   hydroxychloroquine  (PLAQUENIL ) 200 MG tablet Take 400 mg by mouth daily.   JARDIANCE  10 MG TABS tablet Take 10 mg by mouth daily.   leptospermum manuka honey (MEDIHONEY) PSTE paste Apply 1 Application topically daily.   levothyroxine  (SYNTHROID ) 150 MCG tablet Take 150 mcg by mouth daily before breakfast.   liver oil-zinc  oxide (DESITIN) 40 % ointment Apply topically 3 (three) times daily.   lovastatin (MEVACOR) 20 MG tablet Take 20 mg by mouth every evening.   methotrexate  (RHEUMATREX) 2.5 MG tablet Take 10 mg by mouth once a week.   metoprolol  tartrate (LOPRESSOR ) 25 MG tablet Take 25 mg by mouth daily.   polyethylene glycol (MIRALAX  / GLYCOLAX ) 17 g packet Take 17 g by mouth daily.   potassium chloride  (KLOR-CON  M) 10 MEQ tablet Take 10 mEq by mouth daily.   predniSONE  (DELTASONE ) 1 MG tablet Take 3 mg by mouth daily with breakfast.   TRADJENTA  5 MG TABS tablet Take 5 mg by mouth daily.   Vitamin D, Ergocalciferol, (DRISDOL) 1.25 MG (50000 UNIT) CAPS capsule Take 50,000 Units by mouth once a week.   warfarin (COUMADIN ) 5 MG tablet Take 1 tablet (5 mg total) by mouth daily at 4 PM. Take 10 mg once daily on Mon, Wed, Thurs, Fri. Take 5 mg once daily all other days.    Allergies:   Patient has no known allergies.    Social History   Socioeconomic History   Marital status: Single    Spouse name: Not on file   Number of children: Not on file   Years of education: Not on file   Highest education level: Not on file  Occupational History   Not on file  Tobacco Use   Smoking status: Former   Smokeless tobacco: Never  Substance and Sexual Activity   Alcohol use: Not Currently   Drug use: Not Currently   Sexual activity: Not on file  Other Topics Concern   Not on file  Social History Narrative   Not on file   Social Drivers of Health   Financial Resource Strain: Not on file  Food Insecurity: Not on file  Transportation Needs: Not on file  Physical Activity: Not on file  Stress: Not on file  Social Connections: Not on file     Family History:  The patient's family history includes Arthritis in her mother; Cancer in her father; Hypertension in her mother; Thyroid disease in her father.  ROS:   12-point review of systems is negative unless otherwise noted in the HPI.  EKGs/Labs/Other Studies Reviewed:    Studies reviewed were summarized above. The additional studies were reviewed today:  05/10/2022 Echo complete 1. Left ventricular ejection fraction, by estimation, is 55 to 60%. The  left ventricle has normal function. The left ventricle has no regional  wall motion abnormalities. Left ventricular diastolic parameters were  normal.   2. Right ventricular systolic function is normal. The right ventricular  size is normal. There is normal pulmonary artery systolic pressure.   3. The mitral valve is normal in structure. No evidence of mitral valve  regurgitation. No evidence of mitral stenosis.   4. The aortic valve is normal in structure. Aortic valve regurgitation is  not visualized. Aortic valve sclerosis is present, with no evidence of  aortic valve stenosis.   EKG:  EKG is ordered today.  The EKG ordered today demonstrates normal sinus rhythm, rate 67 bpm.  Recent  Labs: 08/09/2022: ALT 12 08/15/2022: BUN 16; Creatinine, Ser 0.81; Hemoglobin 12.2; Platelets 293; Potassium 4.4; Sodium 138  Recent Lipid Panel No results found for: "CHOL", "TRIG", "HDL", "CHOLHDL", "VLDL", "LDLCALC", "LDLDIRECT"  PHYSICAL EXAM:    VS:  BP 136/76   Pulse 67   Ht 5\' 7"  (1.702 m)   Wt (!) 375 lb (170.1 kg)   SpO2 96%   BMI 58.73 kg/m   BMI: Body mass index is 58.73 kg/m.  Physical Exam Vitals and nursing note reviewed.  Constitutional:      General: She is not in acute distress.    Appearance: Normal appearance. She is obese.  Cardiovascular:     Rate and Rhythm: Normal rate and regular rhythm.     Heart sounds: No murmur heard. Pulmonary:     Effort: Pulmonary effort is normal. No respiratory distress.     Breath sounds: No wheezing or rales.  Musculoskeletal:     Right lower leg: No edema.     Comments: L BKA  Skin:    General: Skin is warm and dry.  Neurological:     General: No focal deficit present.     Mental Status: She is alert and oriented to person, place, and time. Mental status is at baseline.  Psychiatric:        Mood and Affect: Mood normal.        Behavior: Behavior normal.     Wt Readings from Last 3 Encounters:  06/16/23 (!) 375 lb (170.1 kg)  08/09/22 (!) 375 lb (170.1 kg)  05/19/22 (!) 373 lb 10.9 oz (169.5 kg)     ASSESSMENT & PLAN:   Persistent atrial fibrillation - Largely asymptomatic. Currently maintaining sinus rhythm.  She should continue amiodarone  200 mg daily as she has a high risk of recurrent atrial fibrillation. Continue home anticoagulation with warfarin, target INR 2-3. Anticoagulation is managed by her PCP. Given long-term amiodarone  use, will check TSH and BMP today. LFTs done 06/14/2023 were within normal limits.  Chronic diastolic heart failure - Volume status difficult to evaluate given morbid obesity but does overall appear euvolemic on exam. Continue furosemide  40 mg daily and Jardiance  10 mg daily.  Chronic  venous insufficiency - Wears compression stockings, overall stable.  Essential hypertension - Blood pressure overall controlled.  Continue Jardiance  10 mg daily, furosemide  40 mg daily, and metoprolol  tartrate 25 mg daily.  Hx of arterial embolism s/p L BKA - On long-term anticoagulation with warfarin, managed by PCP.  Morbid obesity - Wheelchair-bound secondary to morbid obesity and prior left BKA.   Disposition: F/u with Dr. Alvenia Aus  or an APP in 6 months.   Medication Adjustments/Labs and Tests Ordered: Current medicines are reviewed at length with the patient today.  Concerns regarding medicines are outlined above. Medication changes, Labs and Tests ordered today are summarized above and listed in the Patient Instructions accessible in Encounters.   Beather Liming, PA-C 06/16/2023 4:26 PM     [facility] [place] [address] [place], [place] [address] [phone]

## 2023-06-16 NOTE — Patient Instructions (Addendum)
 Medication Instructions:  No changes at this time.   *If you need a refill on your cardiac medications before your next appointment, please call your pharmacy*  Lab Work: TSH & BMP today here in the office.   If you have labs (blood work) drawn today and your tests are completely normal, you will receive your results only by: MyChart Message (if you have MyChart) OR A paper copy in the mail If you have any lab test that is abnormal or we need to change your treatment, we will call you to review the results.  Testing/Procedures: None  Follow-Up: At [facility], you and your health needs are our priority.  As part of our continuing mission to provide you with exceptional heart care, our providers are all part of one team.  This team includes your primary Cardiologist (physician) and Advanced Practice Providers or APPs (Physician Assistants and Nurse Practitioners) who all work together to provide you with the care you need, when you need it.  Your next appointment:   6 month(s)  Provider:   Antionette Kirks, MD or Gildardo Labrador, PA-C

## 2023-06-17 LAB — BASIC METABOLIC PANEL WITH GFR
BUN/Creatinine Ratio: 14 (ref 12–28)
BUN: 15 mg/dL (ref 8–27)
CO2: 23 mmol/L (ref 20–29)
Calcium: 8.8 mg/dL (ref 8.7–10.3)
Chloride: 105 mmol/L (ref 96–106)
Creatinine, Ser: 1.04 mg/dL — ABNORMAL HIGH (ref 0.57–1.00)
Glucose: 94 mg/dL (ref 70–99)
Potassium: 4.2 mmol/L (ref 3.5–5.2)
Sodium: 143 mmol/L (ref 134–144)
eGFR: 57 mL/min/{1.73_m2} — ABNORMAL LOW (ref >59)

## 2023-06-20 LAB — TSH: TSH: 0.879 u[IU]/mL (ref 0.450–4.500)

## 2023-08-12 ENCOUNTER — Other Ambulatory Visit: Payer: Self-pay | Admitting: Cardiovascular Disease

## 2023-10-03 ENCOUNTER — Other Ambulatory Visit: Payer: Self-pay

## 2023-10-03 DIAGNOSIS — A419 Sepsis, unspecified organism: Principal | ICD-10-CM | POA: Diagnosis present

## 2023-10-03 DIAGNOSIS — Z7901 Long term (current) use of anticoagulants: Secondary | ICD-10-CM

## 2023-10-03 DIAGNOSIS — Z89612 Acquired absence of left leg above knee: Secondary | ICD-10-CM

## 2023-10-03 DIAGNOSIS — Z79899 Other long term (current) drug therapy: Secondary | ICD-10-CM

## 2023-10-03 DIAGNOSIS — D689 Coagulation defect, unspecified: Secondary | ICD-10-CM | POA: Diagnosis present

## 2023-10-03 DIAGNOSIS — I480 Paroxysmal atrial fibrillation: Secondary | ICD-10-CM | POA: Diagnosis present

## 2023-10-03 DIAGNOSIS — L89302 Pressure ulcer of unspecified buttock, stage 2: Secondary | ICD-10-CM | POA: Diagnosis present

## 2023-10-03 DIAGNOSIS — M793 Panniculitis, unspecified: Secondary | ICD-10-CM | POA: Diagnosis present

## 2023-10-03 DIAGNOSIS — I110 Hypertensive heart disease with heart failure: Secondary | ICD-10-CM | POA: Diagnosis present

## 2023-10-03 DIAGNOSIS — Z7951 Long term (current) use of inhaled steroids: Secondary | ICD-10-CM

## 2023-10-03 DIAGNOSIS — Z1152 Encounter for screening for COVID-19: Secondary | ICD-10-CM

## 2023-10-03 DIAGNOSIS — E66813 Obesity, class 3: Secondary | ICD-10-CM | POA: Diagnosis present

## 2023-10-03 DIAGNOSIS — E119 Type 2 diabetes mellitus without complications: Secondary | ICD-10-CM | POA: Diagnosis present

## 2023-10-03 DIAGNOSIS — M069 Rheumatoid arthritis, unspecified: Secondary | ICD-10-CM | POA: Diagnosis present

## 2023-10-03 DIAGNOSIS — Z87891 Personal history of nicotine dependence: Secondary | ICD-10-CM

## 2023-10-03 DIAGNOSIS — E8882 Obesity due to disruption of MC4R pathway: Secondary | ICD-10-CM | POA: Diagnosis present

## 2023-10-03 DIAGNOSIS — Z7401 Bed confinement status: Secondary | ICD-10-CM

## 2023-10-03 DIAGNOSIS — Z7985 Long-term (current) use of injectable non-insulin antidiabetic drugs: Secondary | ICD-10-CM

## 2023-10-03 DIAGNOSIS — D849 Immunodeficiency, unspecified: Secondary | ICD-10-CM | POA: Diagnosis present

## 2023-10-03 DIAGNOSIS — E039 Hypothyroidism, unspecified: Secondary | ICD-10-CM | POA: Diagnosis present

## 2023-10-03 DIAGNOSIS — K5909 Other constipation: Secondary | ICD-10-CM | POA: Diagnosis present

## 2023-10-03 DIAGNOSIS — Z79890 Hormone replacement therapy (postmenopausal): Secondary | ICD-10-CM

## 2023-10-03 DIAGNOSIS — E872 Acidosis, unspecified: Secondary | ICD-10-CM | POA: Diagnosis present

## 2023-10-03 DIAGNOSIS — E2740 Unspecified adrenocortical insufficiency: Secondary | ICD-10-CM | POA: Diagnosis present

## 2023-10-03 DIAGNOSIS — Z8261 Family history of arthritis: Secondary | ICD-10-CM

## 2023-10-03 DIAGNOSIS — Z79631 Long term (current) use of antimetabolite agent: Secondary | ICD-10-CM

## 2023-10-03 DIAGNOSIS — Z86718 Personal history of other venous thrombosis and embolism: Secondary | ICD-10-CM

## 2023-10-03 DIAGNOSIS — E785 Hyperlipidemia, unspecified: Secondary | ICD-10-CM | POA: Diagnosis present

## 2023-10-03 DIAGNOSIS — I5033 Acute on chronic diastolic (congestive) heart failure: Secondary | ICD-10-CM | POA: Diagnosis not present

## 2023-10-03 DIAGNOSIS — Z8249 Family history of ischemic heart disease and other diseases of the circulatory system: Secondary | ICD-10-CM

## 2023-10-03 DIAGNOSIS — Z6841 Body Mass Index (BMI) 40.0 and over, adult: Secondary | ICD-10-CM

## 2023-10-03 DIAGNOSIS — Z8349 Family history of other endocrine, nutritional and metabolic diseases: Secondary | ICD-10-CM

## 2023-10-03 DIAGNOSIS — N179 Acute kidney failure, unspecified: Secondary | ICD-10-CM | POA: Diagnosis present

## 2023-10-03 DIAGNOSIS — Z7952 Long term (current) use of systemic steroids: Secondary | ICD-10-CM

## 2023-10-03 DIAGNOSIS — R6521 Severe sepsis with septic shock: Secondary | ICD-10-CM | POA: Diagnosis not present

## 2023-10-03 DIAGNOSIS — D696 Thrombocytopenia, unspecified: Secondary | ICD-10-CM | POA: Diagnosis present

## 2023-10-03 DIAGNOSIS — Z7984 Long term (current) use of oral hypoglycemic drugs: Secondary | ICD-10-CM

## 2023-10-03 DIAGNOSIS — Z809 Family history of malignant neoplasm, unspecified: Secondary | ICD-10-CM

## 2023-10-03 LAB — CBC WITH DIFFERENTIAL/PLATELET

## 2023-10-03 NOTE — ED Triage Notes (Signed)
 Pt arrives via EMS from home for c/o unable to stay awake; st hx of same with UTI

## 2023-10-03 NOTE — ED Triage Notes (Signed)
 Pt to ED via EMS from home, pt reports onset of fatigue chills and n/v abd pain today. Pt reports pain si worse with movement and that's when she vomits, pt reports feeling like this in the past when she has had a UTI

## 2023-10-04 ENCOUNTER — Other Ambulatory Visit: Payer: Self-pay

## 2023-10-04 ENCOUNTER — Emergency Department

## 2023-10-04 ENCOUNTER — Inpatient Hospital Stay
Admission: EM | Admit: 2023-10-04 | Discharge: 2023-10-08 | DRG: 871 | Disposition: A | Discharge status: Home / Self Care | Attending: Internal Medicine | Admitting: Internal Medicine

## 2023-10-04 DIAGNOSIS — N179 Acute kidney failure, unspecified: Secondary | ICD-10-CM | POA: Diagnosis present

## 2023-10-04 DIAGNOSIS — Z6841 Body Mass Index (BMI) 40.0 and over, adult: Secondary | ICD-10-CM | POA: Diagnosis not present

## 2023-10-04 DIAGNOSIS — L0390 Cellulitis, unspecified: Secondary | ICD-10-CM | POA: Diagnosis not present

## 2023-10-04 DIAGNOSIS — Z7401 Bed confinement status: Secondary | ICD-10-CM | POA: Diagnosis not present

## 2023-10-04 DIAGNOSIS — E2740 Unspecified adrenocortical insufficiency: Secondary | ICD-10-CM | POA: Diagnosis present

## 2023-10-04 DIAGNOSIS — M793 Panniculitis, unspecified: Principal | ICD-10-CM | POA: Diagnosis present

## 2023-10-04 DIAGNOSIS — R6520 Severe sepsis without septic shock: Secondary | ICD-10-CM | POA: Diagnosis not present

## 2023-10-04 DIAGNOSIS — R0989 Other specified symptoms and signs involving the circulatory and respiratory systems: Secondary | ICD-10-CM | POA: Diagnosis not present

## 2023-10-04 DIAGNOSIS — E785 Hyperlipidemia, unspecified: Secondary | ICD-10-CM | POA: Diagnosis present

## 2023-10-04 DIAGNOSIS — D696 Thrombocytopenia, unspecified: Secondary | ICD-10-CM | POA: Insufficient documentation

## 2023-10-04 DIAGNOSIS — Z7984 Long term (current) use of oral hypoglycemic drugs: Secondary | ICD-10-CM | POA: Diagnosis not present

## 2023-10-04 DIAGNOSIS — Z1152 Encounter for screening for COVID-19: Secondary | ICD-10-CM | POA: Diagnosis not present

## 2023-10-04 DIAGNOSIS — I480 Paroxysmal atrial fibrillation: Secondary | ICD-10-CM | POA: Diagnosis present

## 2023-10-04 DIAGNOSIS — D849 Immunodeficiency, unspecified: Secondary | ICD-10-CM | POA: Diagnosis present

## 2023-10-04 DIAGNOSIS — E872 Acidosis, unspecified: Secondary | ICD-10-CM | POA: Diagnosis present

## 2023-10-04 DIAGNOSIS — D689 Coagulation defect, unspecified: Secondary | ICD-10-CM | POA: Diagnosis present

## 2023-10-04 DIAGNOSIS — M069 Rheumatoid arthritis, unspecified: Secondary | ICD-10-CM | POA: Diagnosis present

## 2023-10-04 DIAGNOSIS — Z8249 Family history of ischemic heart disease and other diseases of the circulatory system: Secondary | ICD-10-CM | POA: Diagnosis not present

## 2023-10-04 DIAGNOSIS — Z7901 Long term (current) use of anticoagulants: Secondary | ICD-10-CM | POA: Diagnosis not present

## 2023-10-04 DIAGNOSIS — E039 Hypothyroidism, unspecified: Secondary | ICD-10-CM | POA: Diagnosis present

## 2023-10-04 DIAGNOSIS — Z89612 Acquired absence of left leg above knee: Secondary | ICD-10-CM | POA: Diagnosis not present

## 2023-10-04 DIAGNOSIS — I10 Essential (primary) hypertension: Secondary | ICD-10-CM | POA: Diagnosis not present

## 2023-10-04 DIAGNOSIS — E119 Type 2 diabetes mellitus without complications: Secondary | ICD-10-CM | POA: Diagnosis present

## 2023-10-04 DIAGNOSIS — Z152 Genetic susceptibility to obesity: Secondary | ICD-10-CM

## 2023-10-04 DIAGNOSIS — L8990 Pressure ulcer of unspecified site, unspecified stage: Secondary | ICD-10-CM | POA: Insufficient documentation

## 2023-10-04 DIAGNOSIS — E66813 Obesity, class 3: Secondary | ICD-10-CM | POA: Diagnosis present

## 2023-10-04 DIAGNOSIS — A419 Sepsis, unspecified organism: Secondary | ICD-10-CM | POA: Diagnosis present

## 2023-10-04 DIAGNOSIS — I110 Hypertensive heart disease with heart failure: Secondary | ICD-10-CM | POA: Diagnosis present

## 2023-10-04 DIAGNOSIS — L89302 Pressure ulcer of unspecified buttock, stage 2: Secondary | ICD-10-CM | POA: Diagnosis present

## 2023-10-04 DIAGNOSIS — I5033 Acute on chronic diastolic (congestive) heart failure: Secondary | ICD-10-CM | POA: Diagnosis not present

## 2023-10-04 DIAGNOSIS — R6521 Severe sepsis with septic shock: Secondary | ICD-10-CM | POA: Diagnosis not present

## 2023-10-04 LAB — LACTIC ACID, PLASMA
Lactic Acid, Venous: 3.5 mmol/L (ref 0.5–1.9)
Lactic Acid, Venous: 2.2 mmol/L (ref 0.5–1.9)
Lactic Acid, Venous: 4 mmol/L (ref 0.5–1.9)
Lactic Acid, Venous: 3.2 mmol/L (ref 0.5–1.9)
Lactic Acid, Venous: 4.1 mmol/L (ref 0.5–1.9)

## 2023-10-04 LAB — BRAIN NATRIURETIC PEPTIDE: B Natriuretic Peptide: 103.2 pg/mL — ABNORMAL HIGH (ref 0.0–100.0)

## 2023-10-04 LAB — CBC WITH DIFFERENTIAL/PLATELET
Basophils Relative: 0 K/uL (ref 0.0–0.1)
Eosinophils Absolute: 1 K/uL (ref 0.0–0.5)
Eosinophils Relative: 0 K/uL (ref 0.0–0.5)
HCT: 46.7 % — ABNORMAL HIGH (ref 36.0–46.0)
Hemoglobin: 14.8 g/dL (ref 12.0–15.0)
Lymphocytes Relative: 19 %
Lymphs Abs: 0.5 K/uL — AB (ref 0.7–4.0)
MCH: 30.8 pg (ref 26.0–34.0)
MCHC: 31.7 g/dL (ref 30.0–36.0)
MCV: 97.1 fL (ref 80.0–100.0)
Monocytes Absolute: 0 K/uL (ref 0.1–1.0)
Monocytes Relative: 0.1 K/uL (ref 0.1–1.0)
Monocytes Relative: 3 K/uL (ref 0.7–4.0)
Neutro Abs: 1.8 K/uL (ref 1.7–7.7)
Neutrophils Relative %: 76 %
Other: 0.03 K/uL (ref 0.00–0.07)
Platelets: 179 K/uL (ref 150–400)
RBC: 4.81 MIL/uL (ref 3.87–5.11)
RDW: 18 % — ABNORMAL HIGH (ref 11.5–15.5)
Smear Review: 1
Smear Review: NORMAL
WBC: 2.4 K/uL — ABNORMAL LOW (ref 4.0–10.5)
nRBC: 0 % (ref 0.0–0.2)

## 2023-10-04 LAB — URINALYSIS, ROUTINE W REFLEX MICROSCOPIC
Bacteria, UA: NONE SEEN
Bilirubin Urine: NEGATIVE
Glucose, UA: >=500 mg/dL — AB
Hgb urine dipstick: NEGATIVE
Ketones, ur: NEGATIVE mg/dL
Leukocytes,Ua: NEGATIVE
Nitrite: NEGATIVE
Protein, ur: NEGATIVE mg/dL
Specific Gravity, Urine: 1.015 (ref 1.005–1.030)
pH: 5 (ref 5.0–8.0)

## 2023-10-04 LAB — COMPREHENSIVE METABOLIC PANEL WITH GFR
ALT: 11 U/L (ref 0–44)
AST: 22 U/L (ref 15–41)
Albumin: 3.1 g/dL — ABNORMAL LOW (ref 3.5–5.0)
Alkaline Phosphatase: 30 U/L — ABNORMAL LOW (ref 38–126)
Anion gap: 13 (ref 5–15)
BUN: 20 mg/dL (ref 8–23)
CO2: 23 mmol/L (ref 22–32)
Calcium: 8.4 mg/dL — ABNORMAL LOW (ref 8.9–10.3)
Chloride: 105 mmol/L (ref 98–111)
Creatinine, Ser: 1.2 mg/dL — ABNORMAL HIGH (ref 0.44–1.00)
GFR, Estimated: 48 mL/min — ABNORMAL LOW (ref >60)
Glucose, Bld: 146 mg/dL — ABNORMAL HIGH (ref 70–99)
Potassium: 3.9 mmol/L (ref 3.5–5.1)
Sodium: 141 mmol/L (ref 135–145)
Total Bilirubin: 1 mg/dL (ref 0.0–1.2)
Total Protein: 6.5 g/dL (ref 6.5–8.1)

## 2023-10-04 LAB — RESP PANEL BY RT-PCR (RSV, FLU A&B, COVID)  RVPGX2
Influenza A by PCR: NEGATIVE
Influenza B by PCR: NEGATIVE
Resp Syncytial Virus by PCR: NEGATIVE
SARS Coronavirus 2 by RT PCR: NEGATIVE

## 2023-10-04 LAB — PROTIME-INR
INR: 2.3 — ABNORMAL HIGH (ref 0.8–1.2)
Prothrombin Time: 26.2 s — ABNORMAL HIGH (ref 11.4–15.2)

## 2023-10-04 LAB — LIPASE, BLOOD: Lipase: 24 U/L (ref 11–51)

## 2023-10-04 LAB — TROPONIN I (HIGH SENSITIVITY): Troponin I (High Sensitivity): 5 ng/L (ref <18)

## 2023-10-04 MED ORDER — MORPHINE SULFATE (PF) 2 MG/ML IV SOLN
2.0000 mg | INTRAVENOUS | Status: DC | PRN
  Administered 2023-10-05: 2 mg via INTRAVENOUS
  Filled 2023-10-04: qty 1

## 2023-10-04 MED ORDER — ONDANSETRON HCL 4 MG PO TABS: 4.0000 mg | ORAL_TABLET | Freq: Four times a day (QID) | ORAL | Status: DC | PRN

## 2023-10-04 MED ORDER — WARFARIN SODIUM 10 MG PO TABS
10.0000 mg | ORAL_TABLET | Freq: Once | ORAL | Status: AC
  Administered 2023-10-04: 10 mg via ORAL
  Filled 2023-10-04 (×2): qty 1

## 2023-10-04 MED ORDER — IOHEXOL 350 MG/ML SOLN
100.0000 mL | Freq: Once | INTRAVENOUS | Status: AC | PRN
  Administered 2023-10-04: 100 mL via INTRAVENOUS

## 2023-10-04 MED ORDER — AMIODARONE HCL 200 MG PO TABS
200.0000 mg | ORAL_TABLET | Freq: Every day | ORAL | Status: DC
  Administered 2023-10-04 – 2023-10-08 (×5): 200 mg via ORAL
  Filled 2023-10-04 (×5): qty 1

## 2023-10-04 MED ORDER — SENNOSIDES-DOCUSATE SODIUM 8.6-50 MG PO TABS
2.0000 | ORAL_TABLET | Freq: Two times a day (BID) | ORAL | Status: DC
  Administered 2023-10-04 – 2023-10-08 (×9): 2 via ORAL
  Filled 2023-10-04 (×9): qty 2

## 2023-10-04 MED ORDER — SODIUM CHLORIDE 0.9 % IV BOLUS
500.0000 mL | Freq: Once | INTRAVENOUS | Status: AC
  Administered 2023-10-04: 500 mL via INTRAVENOUS

## 2023-10-04 MED ORDER — VANCOMYCIN HCL IN DEXTROSE 1-5 GM/200ML-% IV SOLN: 1000.0000 mg | Freq: Once | INTRAVENOUS | Status: DC

## 2023-10-04 MED ORDER — FLUTICASONE PROPIONATE 50 MCG/ACT NA SUSP: 1.0000 | Freq: Every day | NASAL | Status: DC | PRN

## 2023-10-04 MED ORDER — ONDANSETRON HCL 4 MG/2ML IJ SOLN: 4.0000 mg | Freq: Four times a day (QID) | INTRAMUSCULAR | Status: DC | PRN

## 2023-10-04 MED ORDER — METHYLPREDNISOLONE SODIUM SUCC 125 MG IJ SOLR
125.0000 mg | Freq: Once | INTRAMUSCULAR | Status: AC
  Administered 2023-10-04: 125 mg via INTRAVENOUS
  Filled 2023-10-04: qty 2

## 2023-10-04 MED ORDER — SODIUM CHLORIDE 0.9 % IV BOLUS
1000.0000 mL | Freq: Once | INTRAVENOUS | Status: AC
  Administered 2023-10-04: 1000 mL via INTRAVENOUS

## 2023-10-04 MED ORDER — SODIUM CHLORIDE 0.9 % IV SOLN: INTRAVENOUS | Status: AC

## 2023-10-04 MED ORDER — LINAGLIPTIN 5 MG PO TABS
5.0000 mg | ORAL_TABLET | Freq: Every day | ORAL | Status: DC
  Administered 2023-10-04 – 2023-10-08 (×5): 5 mg via ORAL
  Filled 2023-10-04 (×5): qty 1

## 2023-10-04 MED ORDER — METHOTREXATE SODIUM 2.5 MG PO TABS: 10.0000 mg | ORAL_TABLET | ORAL | Status: DC

## 2023-10-04 MED ORDER — ACETAMINOPHEN 325 MG PO TABS
650.0000 mg | ORAL_TABLET | Freq: Four times a day (QID) | ORAL | Status: DC | PRN
  Administered 2023-10-06: 650 mg via ORAL
  Filled 2023-10-04: qty 2

## 2023-10-04 MED ORDER — ENSURE ENLIVE PO LIQD
237.0000 mL | Freq: Three times a day (TID) | ORAL | Status: DC
  Administered 2023-10-04 – 2023-10-08 (×11): 237 mL via ORAL
  Filled 2023-10-04: qty 237

## 2023-10-04 MED ORDER — POTASSIUM CHLORIDE CRYS ER 10 MEQ PO TBCR
10.0000 meq | EXTENDED_RELEASE_TABLET | Freq: Every day | ORAL | Status: DC
  Administered 2023-10-04 – 2023-10-08 (×5): 10 meq via ORAL
  Filled 2023-10-04 (×5): qty 1

## 2023-10-04 MED ORDER — EMPAGLIFLOZIN 10 MG PO TABS
10.0000 mg | ORAL_TABLET | Freq: Every day | ORAL | Status: DC
  Administered 2023-10-04 – 2023-10-08 (×5): 10 mg via ORAL
  Filled 2023-10-04 (×5): qty 1

## 2023-10-04 MED ORDER — WARFARIN SODIUM 5 MG PO TABS: 5.0000 mg | ORAL_TABLET | Freq: Every day | ORAL | Status: DC

## 2023-10-04 MED ORDER — SODIUM CHLORIDE 0.9 % IV SOLN: INTRAVENOUS | Status: DC

## 2023-10-04 MED ORDER — METHOTREXATE SODIUM 2.5 MG PO TABS: 15.0000 mg | ORAL_TABLET | ORAL | Status: DC

## 2023-10-04 MED ORDER — TRAZODONE HCL 50 MG PO TABS
25.0000 mg | ORAL_TABLET | Freq: Every evening | ORAL | Status: DC | PRN
Start: 2023-10-04 — End: 2023-10-08

## 2023-10-04 MED ORDER — VITAMIN C 500 MG PO TABS
500.0000 mg | ORAL_TABLET | Freq: Two times a day (BID) | ORAL | Status: DC
  Administered 2023-10-04 – 2023-10-08 (×9): 500 mg via ORAL
  Filled 2023-10-04 (×9): qty 1

## 2023-10-04 MED ORDER — POLYETHYLENE GLYCOL 3350 17 G PO PACK
17.0000 g | PACK | Freq: Every day | ORAL | Status: DC
  Administered 2023-10-04 – 2023-10-08 (×4): 17 g via ORAL
  Filled 2023-10-04 (×5): qty 1

## 2023-10-04 MED ORDER — VITAMIN D (ERGOCALCIFEROL) 1.25 MG (50000 UNIT) PO CAPS: 50000.0000 [IU] | ORAL_CAPSULE | ORAL | Status: DC

## 2023-10-04 MED ORDER — ALBUTEROL SULFATE (2.5 MG/3ML) 0.083% IN NEBU
2.5000 mg | INHALATION_SOLUTION | Freq: Four times a day (QID) | RESPIRATORY_TRACT | Status: DC | PRN
  Administered 2023-10-05 – 2023-10-06 (×3): 2.5 mg via RESPIRATORY_TRACT
  Filled 2023-10-04 (×3): qty 3

## 2023-10-04 MED ORDER — SODIUM CHLORIDE 0.9 % IV SOLN
2.0000 g | Freq: Three times a day (TID) | INTRAVENOUS | Status: DC
  Administered 2023-10-04 – 2023-10-07 (×9): 2 g via INTRAVENOUS
  Filled 2023-10-04 (×10): qty 12.5

## 2023-10-04 MED ORDER — SODIUM CHLORIDE 0.9 % IV SOLN
2.0000 g | Freq: Once | INTRAVENOUS | Status: AC
  Administered 2023-10-04: 2 g via INTRAVENOUS
  Filled 2023-10-04: qty 20

## 2023-10-04 MED ORDER — ALBUTEROL SULFATE HFA 108 (90 BASE) MCG/ACT IN AERS: 2.0000 | INHALATION_SPRAY | Freq: Four times a day (QID) | RESPIRATORY_TRACT | Status: DC | PRN

## 2023-10-04 MED ORDER — MAGNESIUM HYDROXIDE 400 MG/5ML PO SUSP
30.0000 mL | Freq: Every day | ORAL | Status: DC | PRN
Start: 2023-10-04 — End: 2023-10-08
  Administered 2023-10-07: 30 mL via ORAL
  Filled 2023-10-04: qty 30

## 2023-10-04 MED ORDER — FUROSEMIDE 40 MG PO TABS
40.0000 mg | ORAL_TABLET | Freq: Every day | ORAL | Status: DC
  Administered 2023-10-04: 40 mg via ORAL
  Filled 2023-10-04: qty 1

## 2023-10-04 MED ORDER — FOLIC ACID 1 MG PO TABS
1.0000 mg | ORAL_TABLET | Freq: Every day | ORAL | Status: DC
  Administered 2023-10-04 – 2023-10-08 (×5): 1 mg via ORAL
  Filled 2023-10-04 (×5): qty 1

## 2023-10-04 MED ORDER — LEVOTHYROXINE SODIUM 50 MCG PO TABS
150.0000 ug | ORAL_TABLET | Freq: Every day | ORAL | Status: DC
  Administered 2023-10-05 – 2023-10-08 (×4): 150 ug via ORAL
  Filled 2023-10-04 (×3): qty 1
  Filled 2023-10-04: qty 3

## 2023-10-04 MED ORDER — ONDANSETRON HCL 4 MG/2ML IJ SOLN
4.0000 mg | Freq: Once | INTRAMUSCULAR | Status: AC
  Administered 2023-10-04: 4 mg via INTRAVENOUS
  Filled 2023-10-04: qty 2

## 2023-10-04 MED ORDER — HYDROXYCHLOROQUINE SULFATE 200 MG PO TABS
400.0000 mg | ORAL_TABLET | Freq: Every day | ORAL | Status: DC
  Administered 2023-10-04: 400 mg via ORAL
  Filled 2023-10-04: qty 2

## 2023-10-04 MED ORDER — PREDNISONE 1 MG PO TABS
1.0000 mg | ORAL_TABLET | Freq: Every day | ORAL | Status: DC
  Administered 2023-10-05: 1 mg via ORAL
  Filled 2023-10-04: qty 1

## 2023-10-04 MED ORDER — GABAPENTIN 300 MG PO CAPS
600.0000 mg | ORAL_CAPSULE | Freq: Three times a day (TID) | ORAL | Status: DC
  Administered 2023-10-04 – 2023-10-08 (×12): 600 mg via ORAL
  Filled 2023-10-04 (×12): qty 2

## 2023-10-04 MED ORDER — PRAVASTATIN SODIUM 20 MG PO TABS
20.0000 mg | ORAL_TABLET | Freq: Every day | ORAL | Status: DC
  Administered 2023-10-04 – 2023-10-07 (×4): 20 mg via ORAL
  Filled 2023-10-04 (×4): qty 1

## 2023-10-04 MED ORDER — SODIUM CHLORIDE 0.9 % IV SOLN: 2.0000 g | Freq: Once | INTRAVENOUS | Status: DC

## 2023-10-04 MED ORDER — ACETAMINOPHEN 650 MG RE SUPP: 650.0000 mg | Freq: Four times a day (QID) | RECTAL | Status: DC | PRN

## 2023-10-04 MED ORDER — VANCOMYCIN HCL 2000 MG/400ML IV SOLN
2000.0000 mg | Freq: Once | INTRAVENOUS | Status: AC
  Administered 2023-10-04: 2000 mg via INTRAVENOUS
  Filled 2023-10-04: qty 400

## 2023-10-04 MED ORDER — FUROSEMIDE 10 MG/ML IJ SOLN
40.0000 mg | Freq: Once | INTRAMUSCULAR | Status: AC
  Administered 2023-10-04: 40 mg via INTRAVENOUS
  Filled 2023-10-04: qty 4

## 2023-10-04 MED ORDER — WARFARIN - PHARMACIST DOSING INPATIENT
Freq: Every day | Status: DC
  Filled 2023-10-04: qty 1

## 2023-10-04 MED ORDER — LACTATED RINGERS IV BOLUS
1000.0000 mL | Freq: Once | INTRAVENOUS | Status: AC
  Administered 2023-10-04: 1000 mL via INTRAVENOUS

## 2023-10-04 MED ORDER — DOCUSATE SODIUM 100 MG PO CAPS: 100.0000 mg | ORAL_CAPSULE | Freq: Two times a day (BID) | ORAL | Status: DC

## 2023-10-04 MED ORDER — VANCOMYCIN HCL 1500 MG/300ML IV SOLN
1500.0000 mg | INTRAVENOUS | Status: DC
  Administered 2023-10-04 – 2023-10-05 (×2): 1500 mg via INTRAVENOUS
  Filled 2023-10-04 (×3): qty 300

## 2023-10-04 MED ORDER — IPRATROPIUM-ALBUTEROL 0.5-2.5 (3) MG/3ML IN SOLN
3.0000 mL | Freq: Once | RESPIRATORY_TRACT | Status: AC
  Administered 2023-10-04: 3 mL via RESPIRATORY_TRACT
  Filled 2023-10-04: qty 3

## 2023-10-04 NOTE — Progress Notes (Addendum)
 Progress Note   Patient: Kayla Reyes FMW:[id number] DOB: 1951-01-17 DOA: 10/04/2023     0 DOS: the patient was seen and examined on 10/04/2023   Brief hospital course: Kayla Reyes is a 73 y.o. African-American female with medical history significant for type diabetes mellitus, hypertension, dyslipidemia, left AKA, hypothyroidism and paroxysmal atrial fibrillation on Coumadin  as well as rheumatoid arthritis, who presented to the emergency room with acute onset of lower abdominal pain with associated induration, swelling, warmth, and tenderness.  Patient was started on IV antibiotics with vancomycin  and Zosyn for panniculitis.   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 Condition appears to be mild, continue 1 more day of IV antibiotics, may be able to discharge tomorrow.  Pulmonary vascular congestion - The patient has a BNP of 103. No evidence of exacerbation congestive heart failure  Rheumatoid arthritis (HCC) - Will continue Plaquenil  and methotrexate .  Paroxysmal atrial fibrillation (HCC) Continue metoprolol , warfarin per pharmacy dosing.  Class III obesity with BMI 57.95. Status post left AKA. Severe debility with bedbound status. Long-term prognosis is poor.  Hypothyroidism - Will continue Synthroid .  Dyslipidemia - Will continue statin therapy.  Essential hypertension Will continue anti-hypertensive therapy.  Addendum: 8662. Lactic acid elevated to 4.0, this appeared to be caused by infection. She has HR 91, WBC 2.4, could be septic. Will order IVF bolus. Due to concerns of pulmonary edema, she had received iv  lasix . Will be cautious with IVF, will give 1 liter of NS bolus, follow very closely for deterioration of respiratory status.   1857.  Pt has received 2 L of fluid bolus, lactic acid is elevated again to 4.1. Pt did not develop shortness of breath, no hypoxemia.  Will give additional 500ml of bolus, followed with NS at 100ml/hr.  Also check hepatic panel and INR in am to r/o liver cirrhosis.  Uncertain, but possible due to sepsis.  Signed out to night shift, if bp starts to drop, may need transfer to ICU for presser. Abx with vancomycin  and cefepime  are adequate.    Subjective:  Still complaining of right sided abdominal pain.  Chronic constipation.  Physical Exam: Vitals:   10/04/23 0755 10/04/23 0830 10/04/23 1000 10/04/23 1146  BP:  105/63 95/60 (!) 90/53  Pulse:  91 88 88  Resp:  19 16 17   Temp: 98.4 F (36.9 C)   98.2 F (36.8 C)  TempSrc: Oral     SpO2:  95% 94% 98%  Weight:      Height:       General exam: Appears calm and comfortable, morbid obese. Respiratory system: Clear to auscultation. Respiratory effort normal. Cardiovascular system: S1 & S2 heard, RRR. No JVD, murmurs, rubs, gallops or clicks. No pedal edema. Gastrointestinal system: Abdomen is nondistended, soft and nontender. No organomegaly or masses felt. Normal bowel sounds heard. Central nervous system: Alert and oriented. No focal neurological deficits. Extremities: Left AKA. Skin: No rashes, lesions or ulcers Psychiatry: Judgement and insight appear normal. Mood & affect appropriate.    Data Reviewed:  Reviewed chest x-ray and the lab results.  Family Communication: son updated over the phone  Disposition: Status is:  Inpatient Remains inpatient appropriate because: Severity of disease, IV treatment     CRITICAL CARE Performed by: Kayla Reyes   Total critical care time: 50 minutes  Critical care time was exclusive of separately billable procedures and treating other patients.  Critical care  was necessary to treat or prevent imminent or life-threatening deterioration.  Critical care was time spent personally by me on the following activities: development of treatment plan with patient and/or surrogate as well as nursing, discussions with consultants, evaluation of patient's response to treatment, examination of patient, obtaining history from patient or surrogate, ordering and performing treatments and interventions, ordering and review of laboratory studies, ordering and review of radiographic studies, pulse oximetry and re-evaluation of patient's condition.   Author: Murvin Mana, MD 10/04/2023 11:48 AM  For on call review [email or web address].

## 2023-10-04 NOTE — Assessment & Plan Note (Signed)
-   Will continue Coumadin  and amiodarone  as well as Lopressor .

## 2023-10-04 NOTE — H&P (Signed)
 [place]   PATIENT NAME: Kayla Reyes    MR#:  [id number]  DATE OF BIRTH:  [name] 09, 1952  DATE OF ADMISSION:  10/04/2023  PRIMARY CARE PHYSICIAN: Buren Rock HERO, MD   Patient is coming from: Home  REQUESTING/REFERRING PHYSICIAN: Ward, Josette SAILOR, DO  CHIEF COMPLAINT:   Chief Complaint  Patient presents with   Fatigue    HISTORY OF PRESENT ILLNESS:  Kayla Reyes is a 73 y.o. African-American female with medical history significant for type diabetes mellitus, hypertension, dyslipidemia, left AKA, hypothyroidism and paroxysmal atrial fibrillation on Coumadin  as well as rheumatoid arthritis, who presented to the emergency room with acute onset of lower abdominal pain with associated induration, swelling, warmth, and tenderness.  She admitted to nausea and vomiting.  She has been having occasional fever and chills.  She was noted to have mildly altered mental status.  No chest pain or palpitations.  No cough or wheezing or dyspnea.  No diarrhea or melena or bright bleeding per rectum.  No dysuria, oliguria or hematuria or flank pain. ED Course: Upon presentation to the ER, BP was 150/52 and later 96/60 with otherwise normal vital signs.  Labs revealed blood glucose of 146 with a creatinine 1.2, calcium 8.4 and alk phos 30 with albumin  3.1 and BNP one 3.2.  Lactic acid was 3.5.  CBC showed leukopenia of 2.4 and lymphopenia with hemoconcentration.  Respiratory panel came back negative.  UA showed more than 500 glucose and was otherwise unremarkable.  Blood cultures were drawn.  Urine culture was drawn.  BNP is 103. EKG as reviewed by me : None Imaging: CT of the abdomen and pelvis showed pancolitis, cholelithiasis, hepatic steatosis and aortic atherosclerosis with no acute abnormality otherwise.  Noncontrast head CT scan revealed no acute intracranial normality.  Portable chest x-ray showed cardiomegaly and vascular congestion.  The patient was given 1 L bolus of IV ringer, IV Rocephin   and vancomycin , 125 mg of IV Solu-Medrol  and 4 mg of IV Zofran  as well as 500 mL IV normal saline bolus.  She will be admitted to a medical-surgical bed for further evaluation and management. PAST MEDICAL HISTORY:   Past Medical History:  Diagnosis Date   Arterial thrombosis (HCC)    a. 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 noted 12/19 in the setting of hypokalemia; b. CHADS2VASc 4 (HTN, age x 1, vascular disease, female); c. on chronic Coumadin  in the setting of prior arterial clot   Rheumatoid arthritis (HCC)     PAST SURGICAL HISTORY:   Past Surgical History:  Procedure Laterality Date   BELOW KNEE LEG AMPUTATION     CARDIOVERSION N/A 05/13/2022   Procedure: CARDIOVERSION;  Surgeon: Perla Evalene PARAS, MD;  Location: [facility] ORS;  Service: Cardiovascular;  Laterality: N/A;    SOCIAL HISTORY:   Social History   Tobacco Use   Smoking status: Former   Smokeless tobacco: Never  Substance Use Topics   Alcohol use: Not Currently    FAMILY HISTORY:   Family History  Problem Relation Age of Onset   Arthritis Mother    Hypertension Mother    Cancer Father    Thyroid disease Father     DRUG ALLERGIES:  No Known Allergies  REVIEW OF  SYSTEMS:   ROS As per history of present illness. All pertinent systems were reviewed above. Constitutional, HEENT, cardiovascular, respiratory, GI, GU, musculoskeletal, neuro, psychiatric, endocrine, integumentary and hematologic systems were reviewed and are otherwise negative/unremarkable except for positive findings mentioned above in the HPI.   MEDICATIONS AT HOME:   Prior to Admission medications   Medication Sig Start Date End Date Taking? Authorizing Provider  albuterol  (PROVENTIL  HFA;VENTOLIN  HFA) 108 (90 Base) MCG/ACT inhaler  Inhale 2 puffs into the lungs every 6 (six) hours as needed for wheezing or shortness of breath.    [provider]  amiodarone  (PACERONE ) 200 MG tablet TAKE 1 TABLET(200 MG) BY MOUTH DAILY 08/14/23   Darron Deatrice LABOR, MD  ascorbic acid  (VITAMIN C ) 500 MG tablet Take 1 tablet (500 mg total) by mouth 2 (two) times daily. 05/19/22   Patel, Sona, MD  docusate sodium  (COLACE) 100 MG capsule Take 1 capsule (100 mg total) by mouth 2 (two) times daily. 05/19/22   Patel, Sona, MD  feeding supplement (ENSURE ENLIVE / ENSURE PLUS) LIQD Take 237 mLs by mouth 3 (three) times daily between meals. 05/19/22   Patel, Sona, MD  fluticasone  (FLONASE ) 50 MCG/ACT nasal spray Place 1 spray into both nostrils daily as needed for allergies or rhinitis.    [provider]  folic acid  (FOLVITE ) 1 MG tablet Take 1 mg by mouth daily.    [provider]  furosemide  (LASIX ) 40 MG tablet Take 1 tablet (40 mg total) by mouth 2 (two) times daily for 7 days, THEN 1 tablet (40 mg total) daily. 08/16/22 09/29/22  Darci Pore, MD  gabapentin  (NEURONTIN ) 300 MG capsule Take 600 mg by mouth 3 (three) times daily. 06/23/22   [provider]  hydroxychloroquine  (PLAQUENIL ) 200 MG tablet Take 400 mg by mouth daily. 12/17/18   [provider]  JARDIANCE  10 MG TABS tablet Take 10 mg by mouth daily.    [provider]  leptospermum manuka honey (MEDIHONEY) PSTE paste Apply 1 Application topically daily. 05/19/22   Patel, Sona, MD  levothyroxine  (SYNTHROID ) 150 MCG tablet Take 150 mcg by mouth daily before breakfast.    [provider]  liver oil-zinc  oxide (DESITIN) 40 % ointment Apply topically 3 (three) times daily. 05/19/22   Patel, Sona, MD  lovastatin (MEVACOR) 20 MG tablet Take 20 mg by mouth every evening.    [provider]  methotrexate  (RHEUMATREX) 2.5 MG tablet Take 10 mg by mouth once a week.    [provider]  metoprolol  tartrate (LOPRESSOR ) 25 MG tablet  Take 25 mg by mouth daily. 01/10/19   [provider]  polyethylene glycol (MIRALAX  / GLYCOLAX ) 17 g packet Take 17 g by mouth daily. 05/19/22   Patel, Sona, MD  potassium chloride  (KLOR-CON  M) 10 MEQ tablet Take 10 mEq by mouth daily. 03/08/22   [provider]  predniSONE  (DELTASONE ) 1 MG tablet Take 3 mg by mouth daily with breakfast.    [provider]  TRADJENTA  5 MG TABS tablet Take 5 mg by mouth daily. 05/04/22   [provider]  Vitamin D , Ergocalciferol , (DRISDOL ) 1.25 MG (50000 UNIT) CAPS capsule Take 50,000 Units by mouth once a week. 06/23/22   [provider]  warfarin (COUMADIN ) 5 MG tablet Take 1 tablet (5 mg total) by mouth daily at 4 PM. Take 10 mg once daily on Mon, Wed, Thurs, Fri. Take 5 mg once daily all other days. 08/16/22   Darci Pore, MD  VITAL SIGNS:  Blood pressure (!) 93/55, pulse 85, temperature 99.7 F (37.6 C), temperature source Rectal, resp. rate 13, height 5' 7 (1.702 m), weight (!) 167.8 kg, SpO2 97%.  PHYSICAL EXAMINATION:  Physical Exam  GENERAL:  73 y.o.-year-old African-American female patient lying in the bed with no acute distress.  She was somnolent but arousable. EYES: Pupils equal, round, reactive to light and accommodation. No scleral icterus. Extraocular muscles intact.  HEENT: Head atraumatic, normocephalic. Oropharynx and nasopharynx clear.  NECK:  Supple, no jugular venous distention. No thyroid enlargement, no tenderness.  LUNGS: Normal breath sounds bilaterally, no wheezing, rales,rhonchi or crepitation. No use of accessory muscles of respiration.  CARDIOVASCULAR: Regular rate and rhythm, S1, S2 normal. No murmurs, rubs, or gallops.  ABDOMEN: Soft, nondistended, nontender. Bowel sounds present. No organomegaly or mass.  EXTREMITIES: No edema, cyanosis, or clubbing.  She is status post left AKA. NEUROLOGIC: Cranial nerves II through XII are intact. Muscle strength 5/5 in all extremities.  Sensation intact. Gait not checked.  PSYCHIATRIC: The patient is alert and oriented x 3.  Normal affect and good eye contact. SKIN: Lower abdominal induration with peau d'orange with warmth, erythema, swelling and tenderness.   LABORATORY PANEL:   CBC Recent Labs  Lab 10/03/23 2344  WBC 2.4*  HGB 14.8  HCT 46.7*  PLT 179   ------------------------------------------------------------------------------------------------------------------  Chemistries  Recent Labs  Lab 10/03/23 2344  NA 141  K 3.9  CL 105  CO2 23  GLUCOSE 146*  BUN 20  CREATININE 1.20*  CALCIUM 8.4*  AST 22  ALT 11  ALKPHOS 30*  BILITOT 1.0   ------------------------------------------------------------------------------------------------------------------  Cardiac Enzymes No results for input(s): TROPONINI in the last 168 hours. ------------------------------------------------------------------------------------------------------------------  RADIOLOGY:  DG Chest Portable 1 View Result Date: 10/04/2023 CLINICAL DATA:  Sepsis, wheezing EXAM: PORTABLE CHEST 1 VIEW COMPARISON:  08/09/2022 FINDINGS: Stable cardiomegaly. Aortic atherosclerosis. Mild vascular congestion. No confluent opacities or overt edema. No effusions. No acute bony abnormality. IMPRESSION: Cardiomegaly, vascular congestion. Electronically Signed   By: Franky Crease M.D.   On: 10/04/2023 03:49   CT ABDOMEN PELVIS W CONTRAST Result Date: 10/04/2023 CLINICAL DATA:  Fatigue, chills, nausea, vomiting, abdominal pain EXAM: CT ABDOMEN AND PELVIS WITH CONTRAST TECHNIQUE: Multidetector CT imaging of the abdomen and pelvis was performed using the standard protocol following bolus administration of intravenous contrast. RADIATION DOSE REDUCTION: This exam was performed according to the departmental dose-optimization program which includes automated exposure control, adjustment of the mA and/or kV according to patient size and/or use of iterative  reconstruction technique. CONTRAST:  OMNIPAQUE  IOHEXOL  350 MG/ML SOLN COMPARISON:  08/14/2022 FINDINGS: Lower chest: No acute abnormality. Hepatobiliary: Hepatic steatosis. Cholelithiasis. No evidence of acute cholecystitis. Pancreas: Unremarkable. Spleen: Unremarkable. Adrenals/Urinary Tract: Stable adrenal glands. No urinary calculi or hydronephrosis. Unremarkable bladder. Stomach/Bowel: No bowel obstruction or bowel wall thickening. Normal appendix. Stomach is within normal limits. Vascular/Lymphatic: Aortic atherosclerotic calcification. No lymphadenopathy. Reproductive: Calcified uterine fibroids.  No adnexal mass. Other: No free intraperitoneal fluid or air. Musculoskeletal: No acute fracture. Similar advanced disc space height loss and degenerative endplate changes at L5-S1. Diffuse skin thickening and subcutaneous edema involving the pannus is similar to prior. This is incompletely imaged. Correlate for cellulitis. No abscess or soft tissue gas. IMPRESSION: 1. No acute abnormality in the abdomen or pelvis. 2. Diffuse skin thickening and subcutaneous edema involving the pannus is similar to prior. This is incompletely imaged. Correlate for cellulitis. 3. Hepatic steatosis. 4. Cholelithiasis. 5. Aortic Atherosclerosis (ICD10-I70.0). Electronically Signed  By: Norman Gatlin M.D.   On: 10/04/2023 02:43   CT HEAD WO CONTRAST ( ) Result Date: 10/04/2023 CLINICAL DATA:  Mental status change, unknown cause EXAM: CT HEAD WITHOUT CONTRAST TECHNIQUE: Contiguous axial images were obtained from the base of the skull through the vertex without intravenous contrast. RADIATION DOSE REDUCTION: This exam was performed according to the departmental dose-optimization program which includes automated exposure control, adjustment of the mA and/or kV according to patient size and/or use of iterative reconstruction technique. COMPARISON:  05/10/2022 FINDINGS: Brain: No acute intracranial abnormality. Specifically, no  hemorrhage, hydrocephalus, mass lesion, acute infarction, or significant intracranial injury. Vascular: No hyperdense vessel or unexpected calcification. Skull: No acute calvarial abnormality. Sinuses/Orbits: No acute findings Other: None IMPRESSION: No acute intracranial abnormality. Electronically Signed   By: Franky Crease M.D.   On: 10/04/2023 01:03      IMPRESSION AND PLAN:  Assessment and Plan: * Panniculitis - The patient will be admitted to a medical-surgical bed. - Will continue antibiotic therapy with IV vancomycin  and cefepime . - Pain management will be provided. - Warm compresses will be utilized.   Pulmonary vascular congestion - The patient has a BNP of 103. - She was given 4 mg of IV Lasix  and will continue her regular Lasix . - She can follow-up with cardiology on an outpatient basis.  Rheumatoid arthritis (HCC) - Will continue Plaquenil  and methotrexate .  Paroxysmal atrial fibrillation (HCC) - Will continue Coumadin  and amiodarone  as well as Lopressor .  Hypothyroidism - Will continue Synthroid .  Dyslipidemia - Will continue statin therapy.  Essential hypertension Will continue anti-hypertensive therapy.    DVT prophylaxis: Lovenox. Advanced Care Planning:  Code Status: full code. Family Communication:  The plan of care was discussed in details with the patient (and family). I answered all questions. The patient agreed to proceed with the above mentioned plan. Further management will depend upon hospital course. Disposition Plan: Back to previous home environment Consults called: none at All the records are reviewed and case discussed with ED provider.  Status is: Inpatient  At the time of the admission, it appears that the appropriate admission status for this patient is inpatient.  This is judged to be reasonable and necessary in order to provide the required intensity of service to ensure the patient's safety given the presenting symptoms, physical exam  findings and initial radiographic and laboratory data in the context of comorbid conditions.  The patient requires inpatient status due to high intensity of service, high risk of further deterioration and high frequency of surveillance required.  I certify that at the time of admission, it is my clinical judgment that the patient will require inpatient hospital care extending more than 2 midnights.                            Dispo: The patient is from: Home              Anticipated d/c is to: Home              Patient currently is not medically stable to d/c.              Difficult to place patient: No  Madison DELENA Peaches M.D on 10/04/2023 at 6:13 AM  [facility]   From 7 PM-7 AM, contact night-coverage [email or web address]  CC: Primary care physician; [place] HERO, MD

## 2023-10-04 NOTE — Assessment & Plan Note (Signed)
-   The patient has a BNP of 103. - She was given 4 mg of IV Lasix  and will continue her regular Lasix . - She can follow-up with cardiology on an outpatient basis.

## 2023-10-04 NOTE — ED Notes (Signed)
 Send Pharmacy Message about need for medication rec. Due medications not verified by pharmacy at this time.

## 2023-10-04 NOTE — ED Notes (Signed)
 Sat patient up in bed to eat breakfast.

## 2023-10-04 NOTE — Assessment & Plan Note (Addendum)
-   Will continue antihypertensive therapy.

## 2023-10-04 NOTE — ED Notes (Signed)
 Charge RN notified primary RN that floor rejected bed for patient due to Labs. MD notified.

## 2023-10-04 NOTE — Hospital Course (Addendum)
 Kayla Reyes is a 73 y.o. African-American female with medical history significant for type diabetes mellitus, hypertension, dyslipidemia, left AKA, hypothyroidism and paroxysmal atrial fibrillation on Coumadin  as well as rheumatoid arthritis, who presented to the emergency room with acute onset of lower abdominal pain with associated induration, swelling, warmth, and tenderness.  Patient was started on IV antibiotics with vancomycin  and Zosyn for panniculitis. Patient developed significant lactic acidosis, received fluid bolus in addition to antibiotics.  Then he developed high fever in the morning of 8/21, blood pressure was low.  Received additional fluid bolus.  Lactic acid has normalized on 8/21. Patient subsequently developed acute on chronic diastolic congestive heart failure.  She was diuresed with IV Lasix , volume status is better.  Now she is medically stable for discharge.

## 2023-10-04 NOTE — Assessment & Plan Note (Signed)
-   The patient will be admitted to a medical-surgical bed. - Will continue antibiotic therapy with IV vancomycin  and cefepime . - Pain management will be provided. - Warm compresses will be utilized.

## 2023-10-04 NOTE — ED Notes (Signed)
 Patient's 0800 medications not yet verified.

## 2023-10-04 NOTE — ED Notes (Signed)
 Medications still not verified. Med rec completed. Pharmacy was messaged about the need for medication verification.

## 2023-10-04 NOTE — Progress Notes (Signed)
 Pharmacy Antibiotic Note  Kayla Reyes is a 73 y.o. female admitted on 10/04/2023 with cellulitis.  Pharmacy has been consulted for Cefepime  & Vancomycin  dosing for 7 days.  Plan: Cefepime  2 gm q8hr per indication & renal fxn.  Pt given Vancomycin  2000 mg once. Vancomycin  1500 mg IV Q 24 hrs. Goal AUC 400-550. Expected AUC: 457.2 SCr used: 1.2, Vd used: 0.5, BMI: 57.8  Pharmacy will continue to follow and will adjust abx dosing whenever warranted.  Temp (24hrs), Avg:99.6 F (37.6 C), Min:99.4 F (37.4 C), Max:99.7 F (37.6 C)   Recent Labs  Lab 10/03/23 2344 10/04/23 0416  WBC 2.4*  --   CREATININE 1.20*  --   LATICACIDVEN  --  3.5*    Estimated Creatinine Clearance: 70.7 mL/min (A) (by C-G formula based on SCr of 1.2 mg/dL (H)).    No Known Allergies  Antimicrobials this admission: 8/20 Ceftriaxone  >> x 1 dose 8/20 Cefepime  >> x 7 days 8/20 Vancomycin  >> x 7 days  Microbiology results: 8/20 BCx: Pending 8/20 UCx: Pending   Thank you for allowing pharmacy to be a part of this patient's care.  Rankin CANDIE Dills, PharmD, [facility] 10/04/2023 5:47 AM

## 2023-10-04 NOTE — ED Provider Notes (Signed)
 [facility] Provider Note    Event Date/Time   First MD Initiated Contact with Patient 10/04/23 [phone]     (approximate)   History   Fatigue   HPI  Cameren Bielefeld is a 73 y.o. female with history of hypertension, diabetes, hyperlipidemia, morbid obesity, paroxysmal atrial fibrillation, rheumatoid arthritis on methotrexate  and prednisone  who presents to the emergency department with fevers, chills, generalized weakness, sleeping more than normal.  She reports similar presentation when she had a UTI.  She denies any dysuria or hematuria.  She is complaining of abdominal pain.  She has had vomiting without diarrhea.   History provided by patient, family.    Past Medical History:  Diagnosis Date   Arterial thrombosis (HCC)    a. 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 noted 12/19 in the setting of hypokalemia; b. CHADS2VASc 4 (HTN, age x 1, vascular disease, female); c. on chronic Coumadin  in the setting of prior arterial clot   Rheumatoid arthritis (HCC)     Past Surgical History:  Procedure Laterality Date   BELOW KNEE LEG AMPUTATION     CARDIOVERSION N/A 05/13/2022   Procedure: CARDIOVERSION;  Surgeon: Perla Evalene PARAS, MD;  Location: [facility] ORS;  Service: Cardiovascular;  Laterality: N/A;    MEDICATIONS:  Prior to Admission medications   Medication Sig Start Date End Date Taking? Authorizing Provider  albuterol  (PROVENTIL  HFA;VENTOLIN  HFA) 108 (90 Base) MCG/ACT inhaler Inhale 2 puffs into the lungs every 6 (six) hours as needed for wheezing or shortness of breath.    [provider]  amiodarone  (PACERONE ) 200 MG tablet TAKE 1 TABLET(200 MG) BY MOUTH DAILY 08/14/23   Darron Deatrice LABOR, MD  ascorbic acid  (VITAMIN C ) 500 MG tablet Take 1  tablet (500 mg total) by mouth 2 (two) times daily. 05/19/22   Patel, Sona, MD  docusate sodium  (COLACE) 100 MG capsule Take 1 capsule (100 mg total) by mouth 2 (two) times daily. 05/19/22   Patel, Sona, MD  feeding supplement (ENSURE ENLIVE / ENSURE PLUS) LIQD Take 237 mLs by mouth 3 (three) times daily between meals. 05/19/22   Patel, Sona, MD  fluticasone  (FLONASE ) 50 MCG/ACT nasal spray Place 1 spray into both nostrils daily as needed for allergies or rhinitis.    [provider]  folic acid  (FOLVITE ) 1 MG tablet Take 1 mg by mouth daily.    [provider]  furosemide  (LASIX ) 40 MG tablet Take 1 tablet (40 mg total) by mouth 2 (two) times daily for 7 days, THEN 1 tablet (40 mg total) daily. 08/16/22 09/29/22  Darci Pore, MD  gabapentin  (NEURONTIN ) 300 MG capsule Take 600 mg by mouth 3 (three) times daily. 06/23/22   [provider]  hydroxychloroquine  (PLAQUENIL ) 200 MG tablet Take 400 mg by mouth daily. 12/17/18   [provider]  JARDIANCE  10 MG TABS tablet Take 10 mg by mouth daily.    [provider]  leptospermum manuka honey (MEDIHONEY) PSTE paste Apply 1 Application topically daily. 05/19/22   Patel, Sona, MD  levothyroxine  (SYNTHROID ) 150 MCG tablet Take 150 mcg by mouth daily before breakfast.    [provider]  liver oil-zinc  oxide (DESITIN) 40 % ointment Apply topically 3 (three) times daily. 05/19/22  Patel, Sona, MD  lovastatin (MEVACOR) 20 MG tablet Take 20 mg by mouth every evening.    [provider]  methotrexate  (RHEUMATREX) 2.5 MG tablet Take 10 mg by mouth once a week.    [provider]  metoprolol  tartrate (LOPRESSOR ) 25 MG tablet Take 25 mg by mouth daily. 01/10/19   [provider]  polyethylene glycol (MIRALAX  / GLYCOLAX ) 17 g packet Take 17 g by mouth daily. 05/19/22   Patel, Sona, MD  potassium chloride  (KLOR-CON  M) 10 MEQ tablet Take 10 mEq by mouth daily. 03/08/22   [provider]   predniSONE  (DELTASONE ) 1 MG tablet Take 3 mg by mouth daily with breakfast.    [provider]  TRADJENTA  5 MG TABS tablet Take 5 mg by mouth daily. 05/04/22   [provider]  Vitamin D , Ergocalciferol , (DRISDOL ) 1.25 MG (50000 UNIT) CAPS capsule Take 50,000 Units by mouth once a week. 06/23/22   [provider]  warfarin (COUMADIN ) 5 MG tablet Take 1 tablet (5 mg total) by mouth daily at 4 PM. Take 10 mg once daily on Mon, Wed, Thurs, Fri. Take 5 mg once daily all other days. 08/16/22   Darci Pore, MD    Physical Exam   Triage Vital Signs: ED Triage Vitals  Encounter Vitals Group     BP 10/03/23 2343 (!) 115/52     Girls Systolic BP Percentile --      Girls Diastolic BP Percentile --      Boys Systolic BP Percentile --      Boys Diastolic BP Percentile --      Pulse Rate 10/03/23 2343 91     Resp 10/03/23 2343 20     Temp 10/03/23 2343 99.4 F (37.4 C)     Temp src --      SpO2 10/03/23 2336 100 %     Weight 10/03/23 2342 (!) 370 lb (167.8 kg)     Height 10/03/23 2342 5' 7 (1.702 m)     Head Circumference --      Peak Flow --      Pain Score 10/03/23 2342 0     Pain Loc --      Pain Education --      Exclude from Growth Chart --     Most recent vital signs: Vitals:   10/04/23 0755 10/04/23 0830  BP:  105/63  Pulse:  91  Resp:  19  Temp: 98.4 F (36.9 C)   SpO2:  95%    CONSTITUTIONAL: Alert, responds appropriately to questions.  Elderly, morbidly obese, nontoxic HEAD: Normocephalic, atraumatic EYES: Conjunctivae clear, pupils appear equal, sclera nonicteric ENT: normal nose; moist mucous membranes NECK: Supple, normal ROM CARD: RRR; S1 and S2 appreciated RESP: Normal chest excursion without splinting or tachypnea; breath sounds clear and equal bilaterally; no wheezes, no rhonchi, no rales, no hypoxia or respiratory distress, speaking full sentences ABD/GI: Exam limited secondary to body habitus.  She has induration, increased  warmth and tenderness underneath the pannus.  She is also diffusely tender throughout the abdomen without guarding or rebound. BACK: The back appears normal EXT: Normal ROM in all joints; no deformity noted, no edema SKIN: Normal color for age and race; warm; no rash on exposed skin NEURO: Moves all extremities equally, normal speech, no facial asymmetry PSYCH: The patient's mood and manner are appropriate.   ED Results / Procedures / Treatments   LABS: (all labs ordered are listed, but only abnormal results are displayed)  Labs Reviewed  CBC WITH DIFFERENTIAL/PLATELET - Abnormal; Notable for the following components:      Result Value   WBC 2.4 (*)    HCT 46.7 (*)    RDW 18.0 (*)    Lymphs Abs 0.5 (*)    All other components within normal limits  COMPREHENSIVE METABOLIC PANEL WITH GFR - Abnormal; Notable for the following components:   Glucose, Bld 146 (*)    Creatinine, Ser 1.20 (*)    Calcium 8.4 (*)    Albumin  3.1 (*)    Alkaline Phosphatase 30 (*)    GFR, Estimated 48 (*)    All other components within normal limits  URINALYSIS, ROUTINE W REFLEX MICROSCOPIC - Abnormal; Notable for the following components:   Color, Urine YELLOW (*)    APPearance CLEAR (*)    Glucose, UA >=500 (*)    All other components within normal limits  LACTIC ACID, PLASMA - Abnormal; Notable for the following components:   Lactic Acid, Venous 3.5 (*)    All other components within normal limits  LACTIC ACID, PLASMA - Abnormal; Notable for the following components:   Lactic Acid, Venous 2.2 (*)    All other components within normal limits  BRAIN NATRIURETIC PEPTIDE - Abnormal; Notable for the following components:   B Natriuretic Peptide 103.2 (*)    All other components within normal limits  PROTIME-INR - Abnormal; Notable for the following components:   Prothrombin Time 26.2 (*)    INR 2.3 (*)    All other components within normal limits  RESP PANEL BY RT-PCR (RSV, FLU A&B, COVID)  [id number]   CULTURE, BLOOD (ROUTINE X 2)  CULTURE, BLOOD (ROUTINE X 2)  URINE CULTURE  LIPASE, BLOOD  LACTIC ACID, PLASMA  TROPONIN I (HIGH SENSITIVITY)     EKG:  EKG Interpretation Date/Time:    Ventricular Rate:    PR Interval:    QRS Duration:    QT Interval:    QTC Calculation:   R Axis:      Text Interpretation:           RADIOLOGY: My personal review and interpretation of imaging: CT head unremarkable.  CT of the abdomen pelvis shows panniculitis.  I have personally reviewed all radiology reports.   DG Chest Portable 1 View Result Date: 10/04/2023 CLINICAL DATA:  Sepsis, wheezing EXAM: PORTABLE CHEST 1 VIEW COMPARISON:  08/09/2022 FINDINGS: Stable cardiomegaly. Aortic atherosclerosis. Mild vascular congestion. No confluent opacities or overt edema. No effusions. No acute bony abnormality. IMPRESSION: Cardiomegaly, vascular congestion. Electronically Signed   By: Franky Crease M.D.   On: 10/04/2023 03:49   CT ABDOMEN PELVIS W CONTRAST Result Date: 10/04/2023 CLINICAL DATA:  Fatigue, chills, nausea, vomiting, abdominal pain EXAM: CT ABDOMEN AND PELVIS WITH CONTRAST TECHNIQUE: Multidetector CT imaging of the abdomen and pelvis was performed using the standard protocol following bolus administration of intravenous contrast. RADIATION DOSE REDUCTION: This exam was performed according to the departmental dose-optimization program which includes automated exposure control, adjustment of the mA and/or kV according to patient size and/or use of iterative reconstruction technique. CONTRAST:  OMNIPAQUE  IOHEXOL  350 MG/ML SOLN COMPARISON:  08/14/2022 FINDINGS: Lower chest: No acute abnormality. Hepatobiliary: Hepatic steatosis. Cholelithiasis. No evidence of acute cholecystitis. Pancreas: Unremarkable. Spleen: Unremarkable. Adrenals/Urinary Tract: Stable adrenal glands. No urinary calculi or hydronephrosis. Unremarkable bladder. Stomach/Bowel: No bowel obstruction or bowel wall thickening.  Normal appendix. Stomach is within normal limits. Vascular/Lymphatic: Aortic atherosclerotic calcification. No lymphadenopathy. Reproductive: Calcified uterine fibroids.  No adnexal  mass. Other: No free intraperitoneal fluid or air. Musculoskeletal: No acute fracture. Similar advanced disc space height loss and degenerative endplate changes at L5-S1. Diffuse skin thickening and subcutaneous edema involving the pannus is similar to prior. This is incompletely imaged. Correlate for cellulitis. No abscess or soft tissue gas. IMPRESSION: 1. No acute abnormality in the abdomen or pelvis. 2. Diffuse skin thickening and subcutaneous edema involving the pannus is similar to prior. This is incompletely imaged. Correlate for cellulitis. 3. Hepatic steatosis. 4. Cholelithiasis. 5. Aortic Atherosclerosis (ICD10-I70.0). Electronically Signed   By: Norman Gatlin M.D.   On: 10/04/2023 02:43   CT HEAD WO CONTRAST ( ) Result Date: 10/04/2023 CLINICAL DATA:  Mental status change, unknown cause EXAM: CT HEAD WITHOUT CONTRAST TECHNIQUE: Contiguous axial images were obtained from the base of the skull through the vertex without intravenous contrast. RADIATION DOSE REDUCTION: This exam was performed according to the departmental dose-optimization program which includes automated exposure control, adjustment of the mA and/or kV according to patient size and/or use of iterative reconstruction technique. COMPARISON:  05/10/2022 FINDINGS: Brain: No acute intracranial abnormality. Specifically, no hemorrhage, hydrocephalus, mass lesion, acute infarction, or significant intracranial injury. Vascular: No hyperdense vessel or unexpected calcification. Skull: No acute calvarial abnormality. Sinuses/Orbits: No acute findings Other: None IMPRESSION: No acute intracranial abnormality. Electronically Signed   By: Franky Crease M.D.   On: 10/04/2023 01:03     PROCEDURES:  Critical Care performed: Yes, see critical care procedure  note(s)   CRITICAL CARE Performed by: Josette Lekeya Rollings   Total critical care time: 30 minutes  Critical care time was exclusive of separately billable procedures and treating other patients.  Critical care was necessary to treat or prevent imminent or life-threatening deterioration.  Critical care was time spent personally by me on the following activities: development of treatment plan with patient and/or surrogate as well as nursing, discussions with consultants, evaluation of patient's response to treatment, examination of patient, obtaining history from patient or surrogate, ordering and performing treatments and interventions, ordering and review of laboratory studies, ordering and review of radiographic studies, pulse oximetry and re-evaluation of patient's condition.   SABRA1-3 Lead EKG Interpretation  Performed by: Arine Foley, Josette SAILOR, DO Authorized by: Yeudiel Mateo, Josette SAILOR, DO     Interpretation: normal     ECG rate:  91   ECG rate assessment: normal     Rhythm: sinus rhythm     Ectopy: none     Conduction: normal       IMPRESSION / MDM / ASSESSMENT AND PLAN / ED COURSE  I reviewed the triage vital signs and the nursing notes.    Patient here with concerns for fevers, chills, generalized weakness.  Similar symptoms with history of UTI.  The patient is on the cardiac monitor to evaluate for evidence of arrhythmia and/or significant heart rate changes.   DIFFERENTIAL DIAGNOSIS (includes but not limited to):   UTI, bacteremia, sepsis, panniculitis, colitis, appendicitis, cholelithiasis, cholecystitis, pancreatitis, bowel obstruction, ACS, arrhythmia, electrolyte derangement, anemia, stroke, meningitis, encephalitis, intracranial hemorrhage   Patient's presentation is most consistent with acute presentation with potential threat to life or bodily function.   PLAN: Will obtain labs, cultures, CT head, CT of the abdomen pelvis, chest x-ray, COVID and flu swab.  Will give IV  antibiotics for panniculitis.  Patient is awake, alert but drowsy here.  Protecting her airway.  She declines any pain medicine.  Will give antiemetics.   MEDICATIONS GIVEN IN ED: Medications  hydroxychloroquine  (PLAQUENIL ) tablet 400 mg (  has no administration in time range)  methotrexate  (RHEUMATREX) tablet 10 mg (has no administration in time range)  amiodarone  (PACERONE ) tablet 200 mg (has no administration in time range)  furosemide  (LASIX ) tablet 40 mg (has no administration in time range)  pravastatin  (PRAVACHOL ) tablet 20 mg (has no administration in time range)  empagliflozin  (JARDIANCE ) tablet 10 mg (has no administration in time range)  levothyroxine  (SYNTHROID ) tablet 150 mcg (150 mcg Oral Not Given 10/04/23 9385)  predniSONE  (DELTASONE ) tablet 3 mg (has no administration in time range)  linagliptin  (TRADJENTA ) tablet 5 mg (has no administration in time range)  polyethylene glycol (MIRALAX  / GLYCOLAX ) packet 17 g (has no administration in time range)  docusate sodium  (COLACE) capsule 100 mg (has no administration in time range)  folic acid  (FOLVITE ) tablet 1 mg (has no administration in time range)  gabapentin  (NEURONTIN ) capsule 600 mg (has no administration in time range)  ascorbic acid  (VITAMIN C ) tablet 500 mg (has no administration in time range)  feeding supplement (ENSURE ENLIVE / ENSURE PLUS) liquid 237 mL (has no administration in time range)  potassium chloride  SA (KLOR-CON  M) CR tablet 10 mEq (has no administration in time range)  Vitamin D  (Ergocalciferol ) (DRISDOL ) 1.25 MG (50000 UNIT) capsule 50,000 Units (has no administration in time range)  fluticasone  (FLONASE ) 50 MCG/ACT nasal spray 1 spray (has no administration in time range)  acetaminophen  (TYLENOL ) tablet 650 mg (has no administration in time range)    Or  acetaminophen  (TYLENOL ) suppository 650 mg (has no administration in time range)  traZODone  (DESYREL ) tablet 25 mg (has no administration in time range)   magnesium  hydroxide (MILK OF MAGNESIA) suspension 30 mL (has no administration in time range)  ondansetron  (ZOFRAN ) tablet 4 mg (has no administration in time range)    Or  ondansetron  (ZOFRAN ) injection 4 mg (has no administration in time range)  morphine  (PF) 2 MG/ML injection 2 mg (has no administration in time range)  albuterol  (PROVENTIL ) (2.5 MG/3ML) 0.083% nebulizer solution 2.5 mg (has no administration in time range)  Warfarin - Pharmacist Dosing Inpatient (has no administration in time range)  ceFEPIme  (MAXIPIME ) 2 g in sodium chloride  0.9 % 100 mL IVPB (has no administration in time range)  vancomycin  (VANCOREADY) IVPB 1500 mg/300 mL (has no administration in time range)  ondansetron  (ZOFRAN ) injection 4 mg (4 mg Intravenous Given 10/04/23 0301)  lactated ringers  bolus 1,000 mL (0 mLs Intravenous Stopped 10/04/23 0410)  iohexol  (OMNIPAQUE ) 350 MG/ML injection 100 mL (100 mLs Intravenous Contrast Given 10/04/23 0227)  cefTRIAXone  (ROCEPHIN ) 2 g in sodium chloride  0.9 % 100 mL IVPB (0 g Intravenous Stopped 10/04/23 0447)  vancomycin  (VANCOREADY) IVPB 2000 mg/400 mL (0 mg Intravenous Stopped 10/04/23 0706)  methylPREDNISolone  sodium succinate  (SOLU-MEDROL ) 125 mg/2 mL injection 125 mg (125 mg Intravenous Given 10/04/23 0309)  ipratropium-albuterol  (DUONEB) 0.5-2.5 (3) MG/3ML nebulizer solution 3 mL (3 mLs Nebulization Given 10/04/23 0310)  sodium chloride  0.9 % bolus 500 mL (0 mLs Intravenous Stopped 10/04/23 0611)  furosemide  (LASIX ) injection 40 mg (40 mg Intravenous Given 10/04/23 0645)     ED COURSE: Patient's labs show leukopenia.  Minimally elevated creatinine of 1.2.  COVID, flu and RSV negative.  Normal LFTs, lipase.  Urine does not appear infected.  Troponin negative.  Chest x-ray reviewed and interpreted by myself and radiologist and shows vascular congestion but BNP is only minimally elevated at 103 although this could be falsely low due to morbid obesity.  She has had some  intermittent wheezing here  improving with breathing treatments but this also could be from vascular congestion.  No pneumonia appreciated.  CT of the abdomen pelvis reviewed and interpreted by myself and the radiologist and is consistent with panniculitis.  She is getting antibiotics.  No sign of any abscess but unfortunately area was incompletely visualized on CT scan.  Clinically I do not see any sign of abscess on my exam and no crepitus to suggest necrotizing fasciitis.  Blood cultures are pending.  Lactic pending.  Will discuss with the hospitalist for admission especially given patient is immunosuppressed on prednisone  and methotrexate .  Holding IV fluids at this time given patient is normotensive and appears volume overloaded on chest x-ray.   CONSULTS:  Consulted and discussed patient's case with hospitatlist, Dr. Lawence.  I have recommended admission and consulting physician agrees and will place admission orders.  Patient (and family if present) agree with this plan.   I reviewed all nursing notes, vitals, pertinent previous records.  All labs, EKGs, imaging ordered have been independently reviewed and interpreted by myself.    OUTSIDE RECORDS REVIEWED: Reviewed recent rheumatology notes.       FINAL CLINICAL IMPRESSION(S) / ED DIAGNOSES   Final diagnoses:  Panniculitis  Pulmonary vascular congestion     Rx / DC Orders   ED Discharge Orders     None        Note:  This document was prepared using Dragon voice recognition software and may include unintentional dictation errors.   Theophil Thivierge, Josette SAILOR, DO 10/04/23 [phone]

## 2023-10-04 NOTE — Assessment & Plan Note (Signed)
-   Will continue Plaquenil  and methotrexate .

## 2023-10-04 NOTE — Progress Notes (Signed)
 Message sent to pharmacy for 1600 dose of Coumadin .

## 2023-10-04 NOTE — Assessment & Plan Note (Signed)
 Will continue Synthroid .

## 2023-10-04 NOTE — Plan of Care (Signed)

## 2023-10-04 NOTE — Progress Notes (Signed)
 ED Pharmacy Antibiotic Sign Off An antibiotic consult was received from an ED provider for Vancomycin  per pharmacy dosing for Cellulitis. A chart review was completed to assess appropriateness.   The following one time order(s) were placed:  Vancomycin  2000 mg per pt wt: >100 kg  Further antibiotic and/or antibiotic pharmacy consults should be ordered by the admitting provider if indicated.   Thank you for allowing pharmacy to be a part of this patient's care.   Thank you, Rankin CANDIE Dills, PharmD, [facility] 10/04/2023 3:05 AM

## 2023-10-04 NOTE — ED Notes (Signed)
 MD notified of EKG completion

## 2023-10-04 NOTE — Assessment & Plan Note (Deleted)
-   The patient will be given 1 dose of IV Lasix  and will continue home Lasix . - Her Jardiance  will be continued. - Will continue with blocker therapy.

## 2023-10-04 NOTE — Assessment & Plan Note (Signed)
 Will continue statin therapy

## 2023-10-04 NOTE — Progress Notes (Addendum)
 PHARMACY - ANTICOAGULATION CONSULT NOTE  Pharmacy Consult for Warfarin Indication: atrial fibrillation and Hx of arterial embolism s/p L BKA  No Known Allergies  Patient Measurements: Height: 5' 7 (170.2 cm) Weight: (!) 167.8 kg (370 lb) IBW/kg (Calculated) : 61.6 HEPARIN  DW (KG): 104.2  Vital Signs: Temp: 98.4 F (36.9 C) (08/20 0755) Temp Source: Oral (08/20 0755) BP: 104/64 (08/20 0700) Pulse Rate: 87 (08/20 0700)  Labs: Recent Labs    10/03/23 0416 10/03/23 2344 10/04/23 0754  HGB  --  14.8  --   HCT  --  46.7*  --   PLT  --  179  --   LABPROT  --   --  26.2*  INR  --   --  2.3*  CREATININE  --  1.20*  --   TROPONINIHS 5  --   --     Estimated Creatinine Clearance: 70.7 mL/min (A) (by C-G formula based on SCr of 1.2 mg/dL (H)).   Medical History: Past Medical History:  Diagnosis Date   Arterial thrombosis (HCC)    a. 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 noted 12/19 in the setting of hypokalemia; b. CHADS2VASc 4 (HTN, age x 1, vascular disease, female); c. on chronic Coumadin  in the setting of prior arterial clot   Rheumatoid arthritis (HCC)     Assessment: Patient is a 73 year old female with a past medical history of diabetes, hypertension, dyslipidemia, left AKA, hypothyroidism and paroxysmal atrial fibrillation on Coumadin  as well as rheumatoid arthritis, who presented to the emergency room with acute onset of lower abdominal pain with associated induration, swelling, warmth, and tenderness. No diarrhea or melena or bright bleeding per rectum reported. Pharmacy has been consulted to initiate patient on their home warfarin while inpatient.   Home dose: Warfarin 10 mg PO Mon, Wed, Thurs, Fri, 5 mg PO all other days Last dose: 8/19 Goal INR:  2-3 Baseline INR: 2.3  Date INR Warfarin Dose  8/20 2.3 10 mg       Hgb 14.8. PLT 179. No signs/symptoms of bleeding noted in chart.  Drug interactions: amiodarone  (home medication)  Goal of Therapy:  INR 2-3 Monitor platelets by anticoagulation protocol: Yes   Plan:  - INR therapeutic at 2.3 - Give 10 mg PO x 1 (home dose) - Monitor INR daily  Lum VEAR Mania, PharmD, BCPS 10/04/2023,8:00 AM

## 2023-10-05 ENCOUNTER — Other Ambulatory Visit: Payer: Self-pay

## 2023-10-05 DIAGNOSIS — L0390 Cellulitis, unspecified: Secondary | ICD-10-CM | POA: Diagnosis not present

## 2023-10-05 DIAGNOSIS — A419 Sepsis, unspecified organism: Secondary | ICD-10-CM | POA: Diagnosis not present

## 2023-10-05 DIAGNOSIS — M793 Panniculitis, unspecified: Secondary | ICD-10-CM

## 2023-10-05 DIAGNOSIS — I480 Paroxysmal atrial fibrillation: Secondary | ICD-10-CM

## 2023-10-05 DIAGNOSIS — R6520 Severe sepsis without septic shock: Secondary | ICD-10-CM

## 2023-10-05 LAB — MRSA NEXT GEN BY PCR, NASAL: MRSA by PCR Next Gen: NOT DETECTED

## 2023-10-05 LAB — CBC
HCT: 37.6 % (ref 36.0–46.0)
Hemoglobin: 12.4 g/dL (ref 12.0–15.0)
MCH: 31.3 pg (ref 26.0–34.0)
MCHC: 33 g/dL (ref 30.0–36.0)
MCV: 94.9 fL (ref 80.0–100.0)
Platelets: 152 K/uL (ref 150–400)
RBC: 3.96 MIL/uL (ref 3.87–5.11)
RDW: 17.9 % — ABNORMAL HIGH (ref 11.5–15.5)
WBC: 11 K/uL — ABNORMAL HIGH (ref 4.0–10.5)
nRBC: 0 % (ref 0.0–0.2)

## 2023-10-05 LAB — LACTIC ACID, PLASMA: Lactic Acid, Venous: 1.8 mmol/L (ref 0.5–1.9)

## 2023-10-05 LAB — HEPATIC FUNCTION PANEL
ALT: 9 U/L (ref 0–44)
AST: 16 U/L (ref 15–41)
Albumin: 2.4 g/dL — ABNORMAL LOW (ref 3.5–5.0)
Alkaline Phosphatase: 33 U/L — ABNORMAL LOW (ref 38–126)
Bilirubin, Direct: <0.1 mg/dL (ref 0.0–0.2)
Total Bilirubin: 0.6 mg/dL (ref 0.0–1.2)
Total Protein: 5.7 g/dL — ABNORMAL LOW (ref 6.5–8.1)

## 2023-10-05 LAB — BASIC METABOLIC PANEL WITH GFR
Anion gap: 10 (ref 5–15)
BUN: 22 mg/dL (ref 8–23)
CO2: 23 mmol/L (ref 22–32)
Calcium: 8.4 mg/dL — ABNORMAL LOW (ref 8.9–10.3)
Chloride: 108 mmol/L (ref 98–111)
Creatinine, Ser: 1.25 mg/dL — ABNORMAL HIGH (ref 0.44–1.00)
GFR, Estimated: 46 mL/min — ABNORMAL LOW (ref >60)
Glucose, Bld: 142 mg/dL — ABNORMAL HIGH (ref 70–99)
Potassium: 3.7 mmol/L (ref 3.5–5.1)
Sodium: 141 mmol/L (ref 135–145)

## 2023-10-05 LAB — URINE CULTURE: Culture: NO GROWTH

## 2023-10-05 LAB — PROTIME-INR
INR: 3.9 — ABNORMAL HIGH (ref 0.8–1.2)
Prothrombin Time: 40 s — ABNORMAL HIGH (ref 11.4–15.2)

## 2023-10-05 LAB — GLUCOSE, CAPILLARY: Glucose-Capillary: 123 mg/dL — ABNORMAL HIGH (ref 70–99)

## 2023-10-05 LAB — MAGNESIUM: Magnesium: 2 mg/dL (ref 1.7–2.4)

## 2023-10-05 MED ORDER — ACETAMINOPHEN 500 MG PO TABS
1000.0000 mg | ORAL_TABLET | Freq: Once | ORAL | Status: AC
  Administered 2023-10-05: 1000 mg via ORAL
  Filled 2023-10-05: qty 2

## 2023-10-05 MED ORDER — CHLORHEXIDINE GLUCONATE CLOTH 2 % EX PADS
6.0000 | MEDICATED_PAD | Freq: Every day | CUTANEOUS | Status: DC
  Administered 2023-10-05 – 2023-10-07 (×3): 6 via TOPICAL

## 2023-10-05 MED ORDER — PREDNISONE 20 MG PO TABS
20.0000 mg | ORAL_TABLET | Freq: Every day | ORAL | Status: DC
  Administered 2023-10-05: 20 mg via ORAL
  Filled 2023-10-05: qty 1

## 2023-10-05 MED ORDER — SODIUM CHLORIDE 0.9 % IV BOLUS
1000.0000 mL | Freq: Once | INTRAVENOUS | Status: AC
  Administered 2023-10-05: 1000 mL via INTRAVENOUS

## 2023-10-05 MED ORDER — PREDNISONE 10 MG PO TABS
10.0000 mg | ORAL_TABLET | Freq: Two times a day (BID) | ORAL | Status: DC
  Administered 2023-10-05 – 2023-10-08 (×6): 10 mg via ORAL
  Filled 2023-10-05 (×6): qty 1

## 2023-10-05 MED ORDER — SODIUM CHLORIDE 0.9 % IV BOLUS
250.0000 mL | Freq: Once | INTRAVENOUS | Status: AC
  Administered 2023-10-05: 250 mL via INTRAVENOUS

## 2023-10-05 MED ORDER — DIGOXIN 0.25 MG/ML IJ SOLN
0.2500 mg | Freq: Once | INTRAMUSCULAR | Status: AC
  Administered 2023-10-05: 0.25 mg via INTRAVENOUS
  Filled 2023-10-05: qty 2

## 2023-10-05 MED ORDER — METOPROLOL TARTRATE 5 MG/5ML IV SOLN
2.5000 mg | Freq: Four times a day (QID) | INTRAVENOUS | Status: DC | PRN
  Administered 2023-10-05: 2.5 mg via INTRAVENOUS
  Filled 2023-10-05: qty 5

## 2023-10-05 MED ORDER — SODIUM CHLORIDE 0.9 % IV BOLUS
500.0000 mL | Freq: Once | INTRAVENOUS | Status: AC
  Administered 2023-10-05: 500 mL via INTRAVENOUS

## 2023-10-05 MED ORDER — DILTIAZEM HCL 25 MG/5ML IV SOLN
5.0000 mg | Freq: Once | INTRAVENOUS | Status: DC
  Filled 2023-10-05: qty 5

## 2023-10-05 MED ORDER — METOPROLOL TARTRATE 25 MG PO TABS
25.0000 mg | ORAL_TABLET | Freq: Two times a day (BID) | ORAL | Status: DC
  Administered 2023-10-05 – 2023-10-08 (×6): 25 mg via ORAL
  Filled 2023-10-05 (×6): qty 1

## 2023-10-05 NOTE — Progress Notes (Addendum)
 Critical care note:  Date of note: 10/05/2023.  Subjective: The patient has been having atrial fibrillation with RVR with rates in the 140s to 150s.  Blood pressure has been soft coming down to 84/46 with retention of one 1.4.  She was ordered 1 g p.o. Tylenol  and 500 mL of IV normal saline bolus.  She denied any chest pain or palpitations.  No cough or wheezing or dyspnea.  No paresthesias or focal muscle weakness.  Objective: Physical examination: Generally: Acutely ill elderly African-American female in no acute respiratory distress. Vital signs: As above, temperature was one 1.4, BP 84/46, heart rate 146 and pulse 70 was 99% on room air. Head - atraumatic, normocephalic.  Pupils - equal, round and reactive to light and accommodation. Extraocular movements are intact. No scleral icterus.  Oropharynx - moist mucous membranes and tongue. No pharyngeal erythema or exudate.  Neck - supple. No JVD. Carotid pulses 2+ bilaterally. No carotid bruits. No palpable thyromegaly or lymphadenopathy. Cardiovascular -irregularly irregular tachycardic rhythm. Normal S1 and S2. No murmurs, gallops or rubs.  Lungs - clear to auscultation bilaterally.  Abdomen - soft and nontender. Positive bowel sounds. No palpable organomegaly or masses.  Extremities - no pitting edema, clubbing or cyanosis.  Neuro - grossly non-focal. Skin - no rashes. Breast, pelvic and rectal - deferred.  Labs and notes were reviewed. Rhythm strip was atrial fibrillation with RVR. - Stat EKG is pending.  Assessment/plan: 1.  Atrial fibrillation with RVR. - The patient was given 500 mL IV normal saline bolus and ordered digoxin  0.25 mg. - If she is not responding to IV digoxin  we can utilize IV Cardizem  if blood pressure will tolerate. - With soft blood pressure and continued tachycardia rhythm she will be placed on IV amiodarone  bolus and drip. - She will be transferred to a progressive unit bed.  2.  Fever in the setting of  pancolitis concerning for sepsis especially given tachycardia. - We will continue antibiotic therapy with IV vancomycin  and cefepime . - She was ordered 1 g of p.o. Tylenol . - Temperature control should help with her heart rate.  Will continue the same current plan of care otherwise. Will continue to closely monitor her.Authorized and performed by: Madison Peaches, MD Total critical care time:   30     minutes. Due to a high probability of clinically significant, life-threatening deterioration, the patient required my highest level of preparedness to intervene emergently and I personally spent this critical care time directly and personally managing the patient.  This critical care time included obtaining a history, examining the patient, pulse oximetry, ordering and review of studies, arranging urgent treatment with development of management plan, evaluation of patient's response to treatment, frequent reassessment, and discussions with other providers. This critical care time was performed to assess and manage the high probability of imminent, life-threatening deterioration that could result in multiorgan failure.  It was exclusive of separately billable procedures and treating other patients and teaching time.

## 2023-10-05 NOTE — Progress Notes (Signed)
 RT responded to Rapid Response. Patient's heart rate  elevated. SpO2 within normal limits. ICU charge nurse at bedside. No respiratory interventions at this time.

## 2023-10-05 NOTE — Significant Event (Signed)
 Rapid Response Event Note   Reason for Call :  Afib RVR   Initial Focused Assessment:  Patient laying in bed complaining of slight palpitations HR 150s BP 177/69 RR 18 O2 100% on room air. No chest pain. Temp 102.4. EKG gotten showing Afib RVR.  Patient given 1g tylenol  and bolus of saline started. BP trending in the 80s. Patients HR decreased down to the 110s and patient stated she felt the palpitations less. PT transferred to the ICU due to no PCU beds being open. BP 88/52 bolus given. Dr zang contacted on day shift when patient was still hypotensive but asymptomatic. 1 liter bolus ordered Primary nurse aware. Patient alert and oriented following commands appropriate throughout rapid    Interventions:  -1gm tylenol  - saline bolus -EKG -Digoxin     MD Notified:  Dr Lawence Call Time:0606 Arrival [phone] End Upfz:[id number]  Lesley LOISE Shams, RN

## 2023-10-05 NOTE — Progress Notes (Addendum)
 Progress Note   Patient: Kayla Reyes FMW:[id number] DOB: [birth year] DOA: 10/04/2023     1 DOS: the patient was seen and examined on 10/05/2023   Brief hospital course: Kayla Reyes is a 73 y.o. African-American female with medical history significant for type diabetes mellitus, hypertension, dyslipidemia, left AKA, hypothyroidism and paroxysmal atrial fibrillation on Coumadin  as well as rheumatoid arthritis, who presented to the emergency room with acute onset of lower abdominal pain with associated induration, swelling, warmth, and tenderness.  Patient was started on IV antibiotics with vancomycin  and Zosyn for panniculitis. Patient developed significant lactic acidosis, received fluid bolus in addition to antibiotics.  Then he developed high fever in the morning of 8/21, blood pressure was low.  Received additional fluid bolus.  Lactic acid has normalized on 8/21.   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 Septic shock. Not POA Relative adrenal insufficiency No evidence of a candidal infection. Patient had leukopenia, significant tachycardia, severe elevation lactic acid level.  Subsequently developed hypotension.  This is secondary to panniculitis.  Patient has been treated with IV fluid bolus, IV antibiotics with vancomycin  and cefepime .  Lactic acid level has normalized today. Blood cultures so far has no growth. However, patient blood pressure still borderline low, given another liter of IV fluid bolus. Patient was chronically taking prednisone  1mg  daily,  relative adrenal insufficiency also playing a role.  Will increase prednisone  to 20 mg daily for now.  Paroxysmal atrial fibrillation (HCC) with RVR. Patient developed significant tachycardia earlier this morning, was given fluid bolus, heart rate much better.  Continue warfarin for pharmacy dosing.   Pulmonary vascular congestion - The patient has a BNP of 103. Deceived IV Lasix  initially, no additional dose needed   Rheumatoid arthritis (HCC) I have discontinued hydroxychloroquine  due to active infection, methotrexate  will not due for another few days.   Class III obesity with BMI 57.95. Status post left AKA. Severe debility with bedbound status. Long-term prognosis is poor.   Hypothyroidism - Will continue Synthroid .   Dyslipidemia - Will continue statin therapy.   Essential hypertension Blood pressure is borderline low, discontinued metoprolol  as patient heart rate is better.       Subjective:  Patient blood pressure is low this morning, however she did not have any complaints.  No short of breath.  No additional abdominal pain.  Physical Exam: Vitals:   10/05/23 0631 10/05/23 0700 10/05/23 0723 10/05/23 0800  BP: (!) 88/52 (!) 75/47 (!) 79/53   Pulse: (!) 112  (!) 108   Resp:  16 18   Temp:   100.3 F (37.9 C) 98.9 F (37.2 C)  TempSrc:   Oral   SpO2: 99%  93%   Weight:   (!) 181.8 kg   Height:   5' 7 (1.702 m)    General exam: Appears calm and comfortable, albeit obesity Respiratory system: Clear to auscultation. Respiratory effort normal. Cardiovascular system: Tachycardic. No JVD, murmurs, rubs, gallops or clicks. No pedal edema. Gastrointestinal system: Abdomen is nondistended, soft and nontender. No organomegaly or masses felt. Normal bowel sounds heard.  Severe abdominal obesity, with thickened skin, tenderness.  Skin fold does not have induration. Central nervous system: Alert and oriented. No focal neurological deficits. Extremities: Left AKA. Skin:  No rashes, lesions or ulcers Psychiatry: Judgement and insight appear normal. Mood & affect appropriate.    Data Reviewed:  Reviewed lab results.  Family Communication: Sister updated over the phone.  Disposition: Status is: Inpatient Remains inpatient appropriate because: Severity of disease, IV treatment     Time spent: 60 minutes  Author: Murvin Mana, MD 10/05/2023 10:55 AM  For on call review [email or web address].

## 2023-10-05 NOTE — Consult Note (Signed)
 PHARMACY - ANTICOAGULATION CONSULT NOTE  Pharmacy Consult for warfarin dosage adjustment  Indication: atrial fibrillation and history of arterial embolism s/p L AKA  No Known Allergies  Patient Measurements: Height: 5' 7 (170.2 cm) Weight: (!) 181.8 kg (400 lb 12.7 oz) IBW/kg (Calculated) : 61.6 HEPARIN  DW (KG): 108.4  Vital Signs: Temp: 98.9 F (37.2 C) (08/21 0800) Temp Source: Oral (08/21 0723) BP: 79/53 (08/21 0723) Pulse Rate: 108 (08/21 0723)  Labs: Recent Labs    10/03/23 0416 10/03/23 2344 10/04/23 0754 10/05/23 0622  HGB  --  14.8  --  12.4  HCT  --  46.7*  --  37.6  PLT  --  179  --  152  LABPROT  --   --  26.2* 40.0*  INR  --   --  2.3* 3.9*  CREATININE  --  1.20*  --  1.25*  TROPONINIHS 5  --   --   --     Estimated Creatinine Clearance: 71.5 mL/min (A) (by C-G formula based on SCr of 1.25 mg/dL (H)).   Medical History: Past Medical History:  Diagnosis Date   Arterial thrombosis (HCC)    a. 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 noted 12/19 in the setting of hypokalemia; b. CHADS2VASc 4 (HTN, age x 1, vascular disease, female); c. on chronic Coumadin  in the setting of prior arterial clot   Rheumatoid arthritis (HCC)     Medications:  Scheduled:   amiodarone   200 mg Oral Daily   ascorbic acid   500 mg Oral BID   Chlorhexidine  Gluconate Cloth  6 each Topical Daily   diltiazem   5 mg Intravenous Once   empagliflozin   10 mg Oral Daily   feeding supplement  237 mL Oral TID BM   folic acid   1 mg Oral Daily   gabapentin   600 mg Oral TID   levothyroxine   150 mcg Oral Q0600   linagliptin   5 mg Oral Daily   [START ON 10/09/2023] methotrexate   10 mg Oral Weekly   polyethylene glycol  17 g Oral Daily   potassium chloride   10 mEq Oral Daily    pravastatin   20 mg Oral q1800   predniSONE   1 mg Oral Q breakfast   senna-docusate  2 tablet Oral BID   [START ON 10/10/2023] Vitamin D  (Ergocalciferol )  50,000 Units Oral Weekly   Warfarin - Pharmacist Dosing Inpatient   Does not apply q1600   Infusions:   sodium chloride  100 mL/hr at 10/05/23 0509   ceFEPime  (MAXIPIME ) IV 2 g (10/05/23 0511)   vancomycin  1,500 mg (10/04/23 2320)   PRN: acetaminophen  **OR** acetaminophen , albuterol , magnesium  hydroxide, morphine  injection, ondansetron  **OR** ondansetron  (ZOFRAN ) IV, traZODone   Assessment: 73 year old female presenting with Afib in RVR and pancolitis. PMH hypothyroidism, RA, HTN, paroxysmal Afib on warfarin, L AKA. [place] VASc score of 5. Renal function worsening with Scr 1.25 mg/dL (1.2 yesterday) with baseline ~1 mg/dL. Lactic acid improved from 4.1 mmol/L to 1.8 mmol/L. CBC notable for WBC of 11 K/uL, hemoglobin of 12.4 g/dL, and platelet count of 152 K/uL.   At home, patient takes warfarin 10 mg on MWF, and 5 mg on all other days. Notable interaction with amiodarone  (increased INR), cefepime  (increased bleeding risk), and prednisone  (increased  bleeding risk). Patient takes amiodarone  at home.   Date INR Warfarin Dose  8/20 2.3 10 mg  8.21  3.9      Goal of Therapy:  INR 2-3 Monitor platelets by anticoagulation protocol: Yes   Plan:  - Hold warfarin today in setting of elevated INR. Consider giving home dose tomorrow based on INR.  - Continue to monitor INR daily and CBC daily for hemoglobin and platelet count.   Kastin Cerda [place] - PharmD Candidate 10/05/2023,8:18 AM

## 2023-10-05 NOTE — Plan of Care (Addendum)
 Patient AOx4. Nurse noted with morning VS tachycardia. Observed HR fluctuating from 54 to 100s  to sustaining rate of 140s -150s as patient was kept on dinamap. Contacted Hospitalist. Placed on telemetry. Sustained Afib with heart rate 140s to 150s, RVR. Patient did feel fluttering but no chest pain nor tightness. Rapid Response called. EKG completed. Bolus NS given. IV Abx and IVF administered during the night. Patient had large BM during the night, external catheter  patent. Patient had some wheezing during the night- 1x Albuterol  tx administered.Patient c/o phantom pain to left AKA. Family had been with patient during the evening- supportive. Patient had been transferred to bariatric bed during the night. Medications administered during rapid response. HR decreased, BP soft. Patient transferred to ICU bed 4. Report given in person at bedside. Safety measures in place throughout the night.  Problem: Safety: Goal: Ability to remain free from injury will improve Outcome: Progressing

## 2023-10-05 NOTE — Progress Notes (Signed)
   10/05/23 0600  Spiritual Encounters  Type of Visit Initial  Care provided to: Pt not available  Conversation partners present during encounter Nurse  Referral source Code page  Reason for visit Code  OnCall Visit Yes   Chaplain responded to rapid response. Several test were been ran by different nurses. The heart was a major concern, eventually patient was being moved to ICU for further observation. Chaplain did not get to speak directly to patient.

## 2023-10-06 DIAGNOSIS — L8990 Pressure ulcer of unspecified site, unspecified stage: Secondary | ICD-10-CM | POA: Insufficient documentation

## 2023-10-06 DIAGNOSIS — M793 Panniculitis, unspecified: Secondary | ICD-10-CM | POA: Diagnosis not present

## 2023-10-06 DIAGNOSIS — I480 Paroxysmal atrial fibrillation: Secondary | ICD-10-CM | POA: Diagnosis not present

## 2023-10-06 DIAGNOSIS — R6520 Severe sepsis without septic shock: Secondary | ICD-10-CM | POA: Diagnosis not present

## 2023-10-06 DIAGNOSIS — A419 Sepsis, unspecified organism: Secondary | ICD-10-CM | POA: Diagnosis not present

## 2023-10-06 DIAGNOSIS — D696 Thrombocytopenia, unspecified: Secondary | ICD-10-CM | POA: Insufficient documentation

## 2023-10-06 LAB — CBC
HCT: 36.2 % (ref 36.0–46.0)
Hemoglobin: 11.5 g/dL — ABNORMAL LOW (ref 12.0–15.0)
MCH: 30.9 pg (ref 26.0–34.0)
MCHC: 31.8 g/dL (ref 30.0–36.0)
MCV: 97.3 fL (ref 80.0–100.0)
Platelets: 145 K/uL — ABNORMAL LOW (ref 150–400)
RBC: 3.72 MIL/uL — ABNORMAL LOW (ref 3.87–5.11)
RDW: 18.1 % — ABNORMAL HIGH (ref 11.5–15.5)
WBC: 13.8 K/uL — ABNORMAL HIGH (ref 4.0–10.5)
nRBC: 0.1 % (ref 0.0–0.2)

## 2023-10-06 LAB — BASIC METABOLIC PANEL WITH GFR
Anion gap: 8 (ref 5–15)
BUN: 24 mg/dL — ABNORMAL HIGH (ref 8–23)
CO2: 24 mmol/L (ref 22–32)
Calcium: 8.9 mg/dL (ref 8.9–10.3)
Chloride: 109 mmol/L (ref 98–111)
Creatinine, Ser: 1.03 mg/dL — ABNORMAL HIGH (ref 0.44–1.00)
GFR, Estimated: 58 mL/min — ABNORMAL LOW (ref >60)
Glucose, Bld: 128 mg/dL — ABNORMAL HIGH (ref 70–99)
Potassium: 3.9 mmol/L (ref 3.5–5.1)
Sodium: 141 mmol/L (ref 135–145)

## 2023-10-06 LAB — PROTIME-INR
INR: 4.8 (ref 0.8–1.2)
Prothrombin Time: 47 s — ABNORMAL HIGH (ref 11.4–15.2)

## 2023-10-06 LAB — MAGNESIUM: Magnesium: 1.9 mg/dL (ref 1.7–2.4)

## 2023-10-06 LAB — PHOSPHORUS: Phosphorus: 2.5 mg/dL (ref 2.5–4.6)

## 2023-10-06 MED ORDER — LACTULOSE 10 GM/15ML PO SOLN
20.0000 g | Freq: Once | ORAL | Status: AC
  Administered 2023-10-06: 20 g via ORAL
  Filled 2023-10-06: qty 30

## 2023-10-06 MED ORDER — ALBUTEROL SULFATE HFA 108 (90 BASE) MCG/ACT IN AERS: 2.0000 | INHALATION_SPRAY | Freq: Four times a day (QID) | RESPIRATORY_TRACT | Status: DC

## 2023-10-06 MED ORDER — VANCOMYCIN HCL 1750 MG/350ML IV SOLN
1750.0000 mg | INTRAVENOUS | Status: DC
  Administered 2023-10-06: 1750 mg via INTRAVENOUS
  Filled 2023-10-06: qty 350

## 2023-10-06 MED ORDER — FUROSEMIDE 40 MG PO TABS
40.0000 mg | ORAL_TABLET | Freq: Two times a day (BID) | ORAL | Status: DC
  Administered 2023-10-06: 40 mg via ORAL
  Filled 2023-10-06: qty 1

## 2023-10-06 NOTE — Consult Note (Signed)
 PHARMACY - ANTICOAGULATION CONSULT NOTE  Pharmacy Consult for warfarin dosage adjustment  Indication: atrial fibrillation and history of arterial embolism s/p L AKA  No Known Allergies  Patient Measurements: Height: 5' 7 (170.2 cm) Weight: (!) 181.8 kg (400 lb 12.7 oz) IBW/kg (Calculated) : 61.6 HEPARIN  DW (KG): 108.4  Vital Signs: Temp: 99 F (37.2 C) (08/22 0400) Temp Source: Oral (08/22 0400) BP: 111/59 (08/22 0700) Pulse Rate: 78 (08/22 0700)  Labs: Recent Labs    10/03/23 2344 10/04/23 0754 10/05/23 0622 10/06/23 0413  HGB 14.8  --  12.4 11.5*  HCT 46.7*  --  37.6 36.2  PLT 179  --  152 145*  LABPROT  --  26.2* 40.0* 47.0*  INR  --  2.3* 3.9* 4.8*  CREATININE 1.20*  --  1.25* 1.03*    Estimated Creatinine Clearance: 86.8 mL/min (A) (by C-G formula based on SCr of 1.03 mg/dL (H)).   Medical History: Past Medical History:  Diagnosis Date   Arterial thrombosis (HCC)    a. 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 noted 12/19 in the setting of hypokalemia; b. CHADS2VASc 4 (HTN, age x 1, vascular disease, female); c. on chronic Coumadin  in the setting of prior arterial clot   Rheumatoid arthritis (HCC)     Medications:  Scheduled:   amiodarone   200 mg Oral Daily   ascorbic acid   500 mg Oral BID   Chlorhexidine  Gluconate Cloth  6 each Topical Daily   diltiazem   5 mg Intravenous Once   empagliflozin   10 mg Oral Daily   feeding supplement  237 mL Oral TID BM   folic acid   1 mg Oral Daily   gabapentin   600 mg Oral TID   levothyroxine   150 mcg Oral Q0600   linagliptin   5 mg Oral Daily   [START ON 10/09/2023] methotrexate   10 mg Oral Weekly   metoprolol  tartrate  25 mg Oral BID   polyethylene glycol  17 g Oral Daily   potassium chloride   10 mEq Oral Daily    pravastatin   20 mg Oral q1800   predniSONE   10 mg Oral BID WC   senna-docusate  2 tablet Oral BID   [START ON 10/10/2023] Vitamin D  (Ergocalciferol )  50,000 Units Oral Weekly   Warfarin - Pharmacist Dosing Inpatient   Does not apply q1600   Infusions:   ceFEPime  (MAXIPIME ) IV 2 g (10/06/23 0641)   vancomycin  1,500 mg (10/05/23 2203)   PRN: acetaminophen  **OR** acetaminophen , albuterol , magnesium  hydroxide, metoprolol  tartrate, ondansetron  **OR** ondansetron  (ZOFRAN ) IV, traZODone   Assessment: 73 year old female presenting with Afib in RVR and pancolitis. PMH hypothyroidism, RA, HTN, paroxysmal Afib on warfarin, L AKA. [place] VASc score of 5. Renal function improved and near baseline at Scr 1.03 mg/dL (8.74 on 1/78). Lactic acid improved from 4.1 mmol/L to 1.8 mmol/L (8/21). CBC notable for WBC of 13.8 K/uL, hemoglobin of 11.5 g/dL (87.5 on 1/78), and platelet count of 152 K/uL.   At home, patient takes warfarin 10 mg on MWF, and 5 mg on all other days. Notable interaction with amiodarone  (increased INR), cefepime  (increased bleeding risk), and prednisone  (increased bleeding risk). Patient takes amiodarone  at home.   Date INR Warfarin Dose  8/20 2.3 10 mg  8/21  3.9 Held   8/22 4.8      Goal of Therapy:  INR 2-3 Monitor platelets by anticoagulation protocol: Yes   Plan:  - Hold warfarin today in setting of elevated INR. Would expect INR to decrease tomorrow given last dose of warfarin on 8/20.  - Continue to monitor INR daily and CBC daily for hemoglobin and platelet count.   Eldredge Veldhuizen [place] - PharmD Candidate 10/06/2023,7:51 AM

## 2023-10-06 NOTE — TOC Progression Note (Signed)
 Transition of Care [facility]) - Progression Note    Patient Details  Name: Kayla Reyes MRN: [id number] Date of Birth: May 12, 1950  [facility] CM/SW Contact  K'La JINNY Ruts, LCSW Phone Number: 10/06/2023, 2:42 PM  Clinical Narrative:    Chart reviewed. I have attempted to complete readmission preventative screen twice. The patient has been sleeping both times. Will follow up with the patient when she is alert.                      Expected Discharge Plan and Services                                               Social Drivers of Health (SDOH) Interventions SDOH Screenings   Food Insecurity: No Food Insecurity (10/04/2023)  Housing: Low Risk  (10/04/2023)  Transportation Needs: No Transportation Needs (10/04/2023)  Utilities: Not At Risk (10/04/2023)  Social Connections: Patient Declined (10/04/2023)  Tobacco Use: Medium Risk (10/03/2023)    Readmission Risk Interventions     No data to display

## 2023-10-06 NOTE — Care Management Important Message (Signed)
 Important Message  Patient Details  Name: Kayla Reyes MRN: [id number] Date of Birth: [birth year]   Important Message Given:  Yes - Medicare IM     [name] 10/06/2023, 1:12 PM

## 2023-10-06 NOTE — Progress Notes (Addendum)
 Progress Note   Patient: Kayla Reyes FMW:[id number] DOB: [birth year] DOA: 10/04/2023     2 DOS: the patient was seen and examined on 10/06/2023   Brief hospital course: Kayla Reyes is a 73 y.o. African-American female with medical history significant for type diabetes mellitus, hypertension, dyslipidemia, left AKA, hypothyroidism and paroxysmal atrial fibrillation on Coumadin  as well as rheumatoid arthritis, who presented to the emergency room with acute onset of lower abdominal pain with associated induration, swelling, warmth, and tenderness.  Patient was started on IV antibiotics with vancomycin  and Zosyn for panniculitis. Patient developed significant lactic acidosis, received fluid bolus in addition to antibiotics.  Then he developed high fever in the morning of 8/21, blood pressure was low.  Received additional fluid bolus.  Lactic acid has normalized on 8/21.   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 Septic shock. Not POA Relative adrenal insufficiency No evidence of a candidal infection. Patient had leukopenia, significant tachycardia, severe elevation lactic acid level.  Subsequently developed hypotension.  This is secondary to panniculitis.  Patient has been treated with IV fluid bolus, IV antibiotics with vancomycin  and cefepime .  Lactic acid level has normalized. Blood cultures so far has no growth. Patient was transferred to stepdown unit on 8/21 for hypotension.  She received  IV fluid bolus, she was also restarted on steroids at prednisone  20 mg daily.  She was taking 1 mg prednisone  daily home, she has relative adrenal insufficiency. Condition finally improved after above treatment.  Patient no longer has any additional hypotension.  Patient be transferred back to regular medical bed.  Continue current antibiotics, probably can go home tomorrow.   Paroxysmal atrial fibrillation (HCC) with RVR. Patient received beta-blocker, has converted to sinus rhythm today.  Continue warfarin per pharmacy dose.   Pulmonary vascular congestion - The patient has a BNP of 103. Deceived IV Lasix  initially, no additional dose needed   Rheumatoid arthritis (HCC) I have discontinued hydroxychloroquine  due to active infection, methotrexate  will not due for another few days. Will restart hydroxychloroquine  at time of discharge.   Class III obesity with BMI 57.95. Status post left AKA. Severe debility with bedbound status. Long-term prognosis is poor.   Hypothyroidism - Will continue Synthroid .   Dyslipidemia - Will continue statin therapy.   Essential hypertension Beta-blocker.  Blood pressure more stabilized   Stage II pressure ulcer in the buttock. POA Followed by RN         Subjective:  Doing much better today, no additional fever.  No shortness of breath.  Has some constipation, will give lactulose   Physical Exam: Vitals:   10/06/23 0700 10/06/23 0800 10/06/23 0900 10/06/23 1000  BP: (!) 111/59 119/60 120/63 108/67  Pulse: 78 80 81 81  Resp: 12 17 15 17   Temp:  98.4 F (36.9 C)    TempSrc:  Oral    SpO2: 98% 94% 97% 97%  Weight:      Height:       General exam: Appears calm and  comfortable  Respiratory system: Clear to auscultation. Respiratory effort normal. Cardiovascular system: S1 & S2 heard, RRR. No JVD, murmurs, rubs, gallops or clicks. No pedal edema. Gastrointestinal system: Abdomen is nondistended, soft and nontender. No organomegaly or  masses felt. Normal bowel sounds heard.  Severe abdominal obesity, skin of the abdominal wall has less swelling, no significant tenderness. Central nervous system: Alert and oriented. No focal neurological deficits. Extremities: Left AKA. Skin: No rashes, lesions or ulcers Psychiatry: Judgement and insight appear normal. Mood & affect appropriate.    Data Reviewed:  Lab results reviewed.  Family Communication: None  Disposition: Status is: Inpatient Remains inpatient appropriate because: Severity of disease, IV treatment     Time spent: 50 minutes  Author: Murvin Mana, MD 10/06/2023 10:56 AM  For on call review [email or web address].

## 2023-10-06 NOTE — Consult Note (Signed)
 Pharmacy Antibiotic Note  Kayla Reyes is a 73 y.o. female admitted on 10/04/2023 with panniculitis. Pharmacy has been consulted for vancomycin  dosing. Renal function today has improved with Scr of 1.03 mg/dL (8.74 mg/dL on 1/78). WBC increasing to 13.8 K/uL likely in setting of increasing prednisone  dose. Patient is afebrile for past 24 hours.  Plan: - Increase vancomycin  dose to 1,750 mg IV q24h. AUC goal 400 - 600.  - Monitor Scr and CBC daily.   Height: 5' 7 (170.2 cm) Weight: (!) 181.8 kg (400 lb 12.7 oz) IBW/kg (Calculated) : 61.6  Temp (24hrs), Avg:99 F (37.2 C), Min:99 F (37.2 C), Max:99 F (37.2 C)  Recent Labs  Lab 10/03/23 2344 10/04/23 0416 10/04/23 0648 10/04/23 1216 10/04/23 1442 10/04/23 1742 10/05/23 0622 10/06/23 0413  WBC 2.4*  --   --   --   --   --  11.0* 13.8*  CREATININE 1.20*  --   --   --   --   --  1.25* 1.03*  LATICACIDVEN  --    < > 2.2* 4.0* 3.2* 4.1* 1.8  --    < > = values in this interval not displayed.    Estimated Creatinine Clearance: 86.8 mL/min (A) (by C-G formula based on SCr of 1.03 mg/dL (H)).    No Known Allergies  Antimicrobials this admission: Vancomycin  vancomycin  8/20 >>  Cefepime  8/20 2 g IV q8h >>  Ceftriaxone  2 g IV x 1 (8/20)   Dose adjustments this admission: Increased vancomycin  from 1,500 mg to 1,750 mg IV q24h on 8/22  Microbiology results: 8/20 BCx: no growth to date 8/20 UCx: no growth 8/21 MRSA PCR: negative  Thank you for allowing pharmacy to be a part of this patient's care.  Dontrel Smethers [place] - PharmD Candidate  10/06/2023 8:14 AM

## 2023-10-06 NOTE — Plan of Care (Signed)

## 2023-10-06 NOTE — Progress Notes (Signed)
 Pt to be transferred to [id number]. Report given to April RN. All questions answered, belongings sent with pt. Vital signs stable at this time.

## 2023-10-07 DIAGNOSIS — I480 Paroxysmal atrial fibrillation: Secondary | ICD-10-CM | POA: Diagnosis not present

## 2023-10-07 DIAGNOSIS — R6520 Severe sepsis without septic shock: Secondary | ICD-10-CM | POA: Diagnosis not present

## 2023-10-07 DIAGNOSIS — A419 Sepsis, unspecified organism: Secondary | ICD-10-CM | POA: Diagnosis not present

## 2023-10-07 DIAGNOSIS — M793 Panniculitis, unspecified: Secondary | ICD-10-CM | POA: Diagnosis not present

## 2023-10-07 LAB — BASIC METABOLIC PANEL WITH GFR
Anion gap: 6 (ref 5–15)
BUN: 22 mg/dL (ref 8–23)
CO2: 26 mmol/L (ref 22–32)
Calcium: 8.7 mg/dL — ABNORMAL LOW (ref 8.9–10.3)
Chloride: 109 mmol/L (ref 98–111)
Creatinine, Ser: 1.04 mg/dL — ABNORMAL HIGH (ref 0.44–1.00)
GFR, Estimated: 57 mL/min — ABNORMAL LOW (ref >60)
Glucose, Bld: 115 mg/dL — ABNORMAL HIGH (ref 70–99)
Potassium: 3.9 mmol/L (ref 3.5–5.1)
Sodium: 141 mmol/L (ref 135–145)

## 2023-10-07 LAB — CBC
HCT: 33.6 % — ABNORMAL LOW (ref 36.0–46.0)
Hemoglobin: 11.2 g/dL — ABNORMAL LOW (ref 12.0–15.0)
MCH: 31.5 pg (ref 26.0–34.0)
MCHC: 33.3 g/dL (ref 30.0–36.0)
MCV: 94.4 fL (ref 80.0–100.0)
Platelets: 156 K/uL (ref 150–400)
RBC: 3.56 MIL/uL — ABNORMAL LOW (ref 3.87–5.11)
RDW: 18 % — ABNORMAL HIGH (ref 11.5–15.5)
WBC: 13.3 K/uL — ABNORMAL HIGH (ref 4.0–10.5)
nRBC: 0.4 % — ABNORMAL HIGH (ref 0.0–0.2)

## 2023-10-07 LAB — PROTIME-INR
INR: 5.6 (ref 0.8–1.2)
Prothrombin Time: 53.3 s — ABNORMAL HIGH (ref 11.4–15.2)

## 2023-10-07 MED ORDER — AMOXICILLIN-POT CLAVULANATE 875-125 MG PO TABS
1.0000 | ORAL_TABLET | Freq: Two times a day (BID) | ORAL | Status: DC
  Administered 2023-10-07 – 2023-10-08 (×3): 1 via ORAL
  Filled 2023-10-07 (×3): qty 1

## 2023-10-07 MED ORDER — DOXYCYCLINE HYCLATE 100 MG PO TABS
100.0000 mg | ORAL_TABLET | Freq: Two times a day (BID) | ORAL | Status: DC
  Administered 2023-10-07 – 2023-10-08 (×3): 100 mg via ORAL
  Filled 2023-10-07 (×3): qty 1

## 2023-10-07 MED ORDER — FUROSEMIDE 10 MG/ML IJ SOLN
40.0000 mg | Freq: Two times a day (BID) | INTRAMUSCULAR | Status: DC
  Administered 2023-10-07: 40 mg via INTRAVENOUS
  Filled 2023-10-07: qty 4

## 2023-10-07 MED ORDER — FLUTICASONE FUROATE-VILANTEROL 200-25 MCG/ACT IN AEPB
1.0000 | INHALATION_SPRAY | Freq: Every day | RESPIRATORY_TRACT | Status: DC
  Administered 2023-10-07 – 2023-10-08 (×2): 1 via RESPIRATORY_TRACT
  Filled 2023-10-07: qty 28

## 2023-10-07 MED ORDER — FUROSEMIDE 40 MG PO TABS
40.0000 mg | ORAL_TABLET | Freq: Two times a day (BID) | ORAL | Status: DC
  Administered 2023-10-07 – 2023-10-08 (×2): 40 mg via ORAL
  Filled 2023-10-07 (×2): qty 1

## 2023-10-07 NOTE — Consult Note (Signed)
 PHARMACY - ANTICOAGULATION CONSULT NOTE  Pharmacy Consult for warfarin dosage adjustment  Indication: atrial fibrillation and history of arterial embolism s/p L AKA  No Known Allergies  Patient Measurements: Height: 5' 7 (170.2 cm) Weight: (!) 181.8 kg (400 lb 12.7 oz) IBW/kg (Calculated) : 61.6 HEPARIN  DW (KG): 108.4  Vital Signs: Temp: 97.9 F (36.6 C) (08/23 0411) BP: 121/56 (08/23 0411) Pulse Rate: 74 (08/23 0411)  Labs: Recent Labs    10/05/23 0622 10/06/23 0413 10/07/23 0604  HGB 12.4 11.5* 11.2*  HCT 37.6 36.2 33.6*  PLT 152 145* 156  LABPROT 40.0* 47.0* 53.3*  INR 3.9* 4.8* 5.6*  CREATININE 1.25* 1.03* 1.04*    Estimated Creatinine Clearance: 85.9 mL/min (A) (by C-G formula based on SCr of 1.04 mg/dL (H)).   Medical History: Past Medical History:  Diagnosis Date   Arterial thrombosis (HCC)    a. 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 noted 12/19 in the setting of hypokalemia; b. CHADS2VASc 4 (HTN, age x 1, vascular disease, female); c. on chronic Coumadin  in the setting of prior arterial clot   Rheumatoid arthritis (HCC)     Medications:  Scheduled:   amiodarone   200 mg Oral Daily   ascorbic acid   500 mg Oral BID   Chlorhexidine  Gluconate Cloth  6 each Topical Daily   empagliflozin   10 mg Oral Daily   feeding supplement  237 mL Oral TID BM   folic acid   1 mg Oral Daily   furosemide   40 mg Oral BID   gabapentin   600 mg Oral TID   levothyroxine   150 mcg Oral Q0600   linagliptin   5 mg Oral Daily   [START ON 10/09/2023] methotrexate   10 mg Oral Weekly   metoprolol  tartrate  25 mg Oral BID   polyethylene glycol  17 g Oral Daily   potassium chloride   10 mEq Oral Daily   pravastatin   20 mg Oral q1800   predniSONE   10 mg Oral BID WC    senna-docusate  2 tablet Oral BID   [START ON 10/10/2023] Vitamin D  (Ergocalciferol )  50,000 Units Oral Weekly   Warfarin - Pharmacist Dosing Inpatient   Does not apply q1600   Infusions:   ceFEPime  (MAXIPIME ) IV 2 g (10/07/23 0609)   vancomycin  1,750 mg (10/06/23 2316)   PRN: acetaminophen  **OR** acetaminophen , albuterol , magnesium  hydroxide, metoprolol  tartrate, ondansetron  **OR** ondansetron  (ZOFRAN ) IV, traZODone   Assessment: 73 year old female presenting with Afib in RVR and pancolitis. PMH hypothyroidism, RA, HTN, paroxysmal Afib on warfarin, L AKA. [place] VASc score of 5. Renal function improved and near baseline at Scr 1.03 mg/dL (8.74 on 1/78). Lactic acid improved from 4.1 mmol/L to 1.8 mmol/L (8/21). CBC notable for WBC of 13.8 K/uL, hemoglobin of 11.5 g/dL (87.5 on 1/78), and platelet count of 152 K/uL.   At home, patient takes warfarin 10 mg on MWF, and 5 mg on all other days. Notable interaction with amiodarone  (increased INR), cefepime  (increased bleeding risk), and prednisone  (increased bleeding risk). Patient takes amiodarone  at home.   Date INR Warfarin Dose  8/20 2.3 10 mg  8/21  3.9 Held   8/22 4.8 Held     Goal of Therapy:  INR 2-3 Monitor platelets by anticoagulation protocol: Yes  Plan:  - Hold warfarin today in setting of elevated INR. Would expect INR to decrease tomorrow given last dose of warfarin on 8/20.  - Continue to monitor INR daily and CBC daily for hemoglobin and platelet count.   Yeilyn Gent A Maykel Reitter, PharmD Clinical Pharmacist 10/07/2023 8:07 AM

## 2023-10-07 NOTE — Plan of Care (Signed)

## 2023-10-07 NOTE — Plan of Care (Signed)
  Problem: Clinical Measurements: Goal: Ability to maintain clinical measurements within normal limits will improve Outcome: Progressing Goal: Diagnostic test results will improve Outcome: Progressing Goal: Respiratory complications will improve Outcome: Progressing   Problem: Activity: Goal: Risk for activity intolerance will decrease Outcome: Progressing   Problem: Nutrition: Goal: Adequate nutrition will be maintained Outcome: Progressing   Problem: Elimination: Goal: Will not experience complications related to bowel motility Outcome: Progressing Goal: Will not experience complications related to urinary retention Outcome: Progressing   Problem: Pain Managment: Goal: General experience of comfort will improve and/or be controlled Outcome: Progressing   Problem: Safety: Goal: Ability to remain free from injury will improve Outcome: Progressing   Problem: Skin Integrity: Goal: Skin integrity will improve Outcome: Progressing

## 2023-10-07 NOTE — Progress Notes (Signed)
 Progress Note   Patient: Kayla Reyes FMW:[id number] DOB: 12-30-1950 DOA: 10/04/2023     3 DOS: the patient was seen and examined on 10/07/2023   Brief hospital course: Kayla Reyes is a 73 y.o. African-American female with medical history significant for type diabetes mellitus, hypertension, dyslipidemia, left AKA, hypothyroidism and paroxysmal atrial fibrillation on Coumadin  as well as rheumatoid arthritis, who presented to the emergency room with acute onset of lower abdominal pain with associated induration, swelling, warmth, and tenderness.  Patient was started on IV antibiotics with vancomycin  and Zosyn for panniculitis. Patient developed significant lactic acidosis, received fluid bolus in addition to antibiotics.  Then he developed high fever in the morning of 8/21, blood pressure was low.  Received additional fluid bolus.  Lactic acid has normalized on 8/21.   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 Septic shock. Not POA Relative adrenal insufficiency No evidence of a candidal infection. Patient had leukopenia, significant tachycardia, severe elevation lactic acid level.  Subsequently developed hypotension.  This is secondary to panniculitis.  Patient has been treated with IV fluid bolus, IV antibiotics with vancomycin  and cefepime .  Lactic acid level has normalized. Blood cultures so far has no growth. Patient was transferred to stepdown unit on 8/21 for hypotension.  She received IV  fluid bolus, she was also restarted on steroids at prednisone  20 mg daily.  She was taking 1 mg prednisone  daily home, she has relative adrenal insufficiency. Condition finally improved after above treatment.  Patient no longer has any additional hypotension.  Patient be transferred back to regular medical bed 8/22. Condition improved, change antibiotic to oral Augmentin  and doxycycline .  Likely acute on chronic diastolic congestive heart failure. Patient started have short of breath with minimal exertion, wheezing, started have crackles in the bases. This appears to be secondary to IV fluid received during resuscitation of septic shock.  Since he is more hemodynamic stable today, I will start IV Lasix .  Her most recent echo was performed in 4/24, ejection fraction was normal, at that time, LV diastolic function was also normal. She was chronically on oral Lasix , which was started yesterday.  I will change to IV Lasix  today.  She probably can go home tomorrow if short of breath improves.  Obtain another echocardiogram.    Paroxysmal atrial fibrillation (HCC) with RVR. Coagulopathy. Patient received beta-blocker, has converted to sinus rhythm today.  Continue warfarin per pharmacy dose. INR elevated, hold warfarin for now.   Rheumatoid arthritis (HCC) I have discontinued hydroxychloroquine  due to active infection, methotrexate  will not due for another few days. Will restart hydroxychloroquine  at time of discharge.   Class III obesity with BMI 57.95. Status post left AKA. Severe debility with bedbound status. Long-term prognosis is poor.   Hypothyroidism - Will continue Synthroid .   Dyslipidemia - Will continue statin therapy.   Essential hypertension Beta-blocker.  Blood pressure more stabilized   Stage II pressure ulcer in the buttock. POA Followed by RN  Subjective:  Chief complaint Short of breath and wheezing with minimal exertion.  No cough.  No abdominal  pain  Physical Exam: Vitals:   10/06/23 1942 10/07/23 0015 10/07/23 0411 10/07/23 0820  BP: 127/60 (!) 123/59 (!) 121/56 (!) 117/59  Pulse: 81 77 74 84  Resp: 18 18 17 16   Temp: 99.1 F (37.3 C) 98.2 F (36.8 C) 97.9 F (36.6 C) 98.3 F (36.8 C)  TempSrc:      SpO2: 100% 100% 98% 100%  Weight:      Height:       General exam: Appears calm and comfortable, obese obesity Respiratory system: Crackles in the bases.SABRA Respiratory effort normal. Cardiovascular system: S1 & S2 heard, RRR. No JVD, murmurs, rubs, gallops or clicks.  Gastrointestinal system: Abdomen is nondistended, soft and nontender. No organomegaly or masses felt. Normal bowel sounds heard.  Significant abdominal obesity, abdominal wall swelling much better. Central nervous system: Alert and oriented. No focal neurological deficits. Extremities: Post left AKA. Skin: No rashes, lesions or ulcers Psychiatry: Judgement and insight appear normal. Mood & affect appropriate.    Data Reviewed:  Lab results reviewed.  Family Communication: Sister updated over the phone  Disposition: Status is: Inpatient Remains inpatient appropriate because: Severity of disease, IV treatment.     Time spent: 50 minutes  Author: Murvin Mana, MD 10/07/2023 10:01 AM  For on call review [email or web address].

## 2023-10-08 ENCOUNTER — Inpatient Hospital Stay
Admit: 2023-10-08 | Discharge: 2023-10-08 | Disposition: A | Discharge status: Home / Self Care | Attending: Internal Medicine | Admitting: Internal Medicine

## 2023-10-08 DIAGNOSIS — R6520 Severe sepsis without septic shock: Secondary | ICD-10-CM | POA: Diagnosis not present

## 2023-10-08 DIAGNOSIS — A419 Sepsis, unspecified organism: Secondary | ICD-10-CM | POA: Diagnosis not present

## 2023-10-08 DIAGNOSIS — M793 Panniculitis, unspecified: Secondary | ICD-10-CM | POA: Diagnosis not present

## 2023-10-08 DIAGNOSIS — I480 Paroxysmal atrial fibrillation: Secondary | ICD-10-CM | POA: Diagnosis not present

## 2023-10-08 LAB — PROTIME-INR
INR: 3.6 — ABNORMAL HIGH (ref 0.8–1.2)
Prothrombin Time: 37.2 s — ABNORMAL HIGH (ref 11.4–15.2)

## 2023-10-08 LAB — BASIC METABOLIC PANEL WITH GFR
Anion gap: 6 (ref 5–15)
BUN: 25 mg/dL — ABNORMAL HIGH (ref 8–23)
CO2: 27 mmol/L (ref 22–32)
Calcium: 8.6 mg/dL — ABNORMAL LOW (ref 8.9–10.3)
Chloride: 108 mmol/L (ref 98–111)
Creatinine, Ser: 0.87 mg/dL (ref 0.44–1.00)
GFR, Estimated: >60 mL/min (ref >60)
Glucose, Bld: 146 mg/dL — ABNORMAL HIGH (ref 70–99)
Potassium: 3.8 mmol/L (ref 3.5–5.1)
Sodium: 141 mmol/L (ref 135–145)

## 2023-10-08 LAB — MAGNESIUM: Magnesium: 2.2 mg/dL (ref 1.7–2.4)

## 2023-10-08 MED ORDER — FUROSEMIDE 40 MG PO TABS: 40.0000 mg | ORAL_TABLET | Freq: Every day | ORAL | Status: AC

## 2023-10-08 MED ORDER — AMOXICILLIN-POT CLAVULANATE 875-125 MG PO TABS: 1.0000 | ORAL_TABLET | Freq: Two times a day (BID) | ORAL | 0 refills | Status: AC

## 2023-10-08 MED ORDER — FLUTICASONE FUROATE-VILANTEROL 200-25 MCG/ACT IN AEPB: 1.0000 | INHALATION_SPRAY | Freq: Every day | RESPIRATORY_TRACT | 0 refills | Status: AC

## 2023-10-08 MED ORDER — PREDNISONE 1 MG PO TABS: ORAL_TABLET | ORAL | 0 refills | Status: AC

## 2023-10-08 MED ORDER — DOXYCYCLINE HYCLATE 100 MG PO TABS: 100.0000 mg | ORAL_TABLET | Freq: Two times a day (BID) | ORAL | 0 refills | Status: AC

## 2023-10-08 NOTE — Discharge Instructions (Signed)
 Follow-up with PCP in 1 week. Complete antibiotics. Do not take warfarin today, restart at the regular dose tomorrow.

## 2023-10-08 NOTE — Plan of Care (Signed)

## 2023-10-08 NOTE — TOC Transition Note (Signed)
 Transition of Care [facility]) - Discharge Note   Patient Details  Name: Adalei Novell MRN: [id number] Date of Birth: [birth year]  [facility] CM/SW Contact:  Racheal LITTIE Schimke, RN Phone Number: 10/08/2023, 10:04 AM   Final next level of care: Home/Self Care Barriers to Discharge: Barriers Resolved   Patient Goals and CMS Choice    Discharge Placement             Patient and family notified of of transfer: 10/08/23  Discharge Plan and Services Additional resources added to the After Visit Summary for                  DME Arranged: N/A DME Agency: NA       HH Arranged: NA HH Agency: NA        Social Drivers of Health (SDOH) Interventions SDOH Screenings   Food Insecurity: No Food Insecurity (10/04/2023)  Housing: Low Risk  (10/04/2023)  Transportation Needs: No Transportation Needs (10/04/2023)  Utilities: Not At Risk (10/04/2023)  Social Connections: Patient Declined (10/04/2023)  Tobacco Use: Medium Risk (10/03/2023)     Readmission Risk Interventions     No data to display

## 2023-10-08 NOTE — Discharge Summary (Signed)
 Physician Discharge Summary   Patient: Kayla Reyes MRN: [id number] DOB: 01-Jun-1950  Admit date:     10/04/2023  Discharge date: 10/08/23  Discharge Physician: Murvin Mana   PCP: Buren Rock HERO, MD   Recommendations at discharge:   Follow-up with PCP in 1 week. Complete antibiotics. Do not take warfarin today, restart at the regular dose tomorrow.  Follow-up echocardiogram results with PCP.  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 African-American female with medical history significant for type diabetes mellitus, hypertension, dyslipidemia, left AKA, hypothyroidism and paroxysmal atrial fibrillation on Coumadin  as well as rheumatoid arthritis, who presented to the emergency room with acute onset of lower abdominal pain with associated induration, swelling, warmth, and tenderness.  Patient was started on IV antibiotics with vancomycin  and Zosyn for panniculitis. Patient developed significant lactic acidosis, received fluid bolus in addition to antibiotics.  Then he developed high fever in the morning of 8/21, blood pressure was low.  Received additional fluid bolus.  Lactic acid has normalized on 8/21. Patient subsequently developed acute on chronic diastolic congestive heart failure.  She was diuresed with IV Lasix , volume status is better.  Now she is medically stable for discharge.  Assessment and Plan: Panniculitis Severe sepsis secondary to  panniculitis. Septic shock. Not POA Relative adrenal insufficiency No evidence of a candidal infection. Patient had leukopenia, significant tachycardia, severe elevation lactic acid level.  Subsequently developed hypotension.  This is secondary to panniculitis.  Patient has been treated with IV fluid bolus, IV antibiotics with vancomycin  and cefepime .  Lactic acid level has normalized. Blood cultures so far has no growth. Patient was transferred to stepdown unit on 8/21 for hypotension.  She received IV fluid bolus, she was also restarted on steroids at prednisone  20 mg daily.  She was taking 1 mg prednisone  daily home, she has relative adrenal insufficiency. Condition finally improved after above treatment.  Patient no longer has any additional hypotension.  Patient be transferred back to regular medical bed 8/22. Condition improved, change antibiotic to oral Augmentin  and doxycycline . Will continue oral antibiotics for additional 3 days to complete 7-day dose. I will also taper down prednisone  gradually to 1 mg.  Patient medically stable for discharge.   Likely acute on chronic diastolic congestive heart failure.  Ruled in, not POA Patient started have short of breath with minimal exertion, wheezing, started have crackles in the bases. This appears to be secondary to IV fluid received during resuscitation of septic shock.  Since he is more hemodynamic stable today, I will start IV Lasix .  Her most recent echo was performed in 4/24, ejection fraction was normal, at that time, LV diastolic function was also normal. Echocardiogram will be performed today, not able to get results before discharge.  Please follow-up on the results with PCP. Patient was given IV Lasix , she diuresed well.  Short of breath much improved.  Lung sounds better except some wheezes.  Patient was chronically on  as needed albuterol , I have added Breo to the regimen.  Condition improved, no hypoxemia.     Paroxysmal atrial  fibrillation (HCC) with RVR. Coagulopathy. Patient received beta-blocker, has converted to sinus rhythm today.  Continue warfarin per pharmacy dose. Continue hold warfarin today, resume normal dose tomorrow.   Rheumatoid arthritis (HCC) Resume home treatment at discharge.   Class III obesity with BMI 57.95. Status post left AKA. Severe debility with bedbound status. Long-term prognosis is poor.   Hypothyroidism - Will continue Synthroid .   Dyslipidemia - Will continue statin therapy.   Essential hypertension Beta-blocker.  Blood pressure more stabilized   Stage II pressure ulcer in the buttock. POA Follow-up with PCP as outpatient.       Consultants: None Procedures performed: None  Disposition: Home Diet recommendation:  Discharge Diet Orders (From admission, onward)     Start     Ordered   10/08/23 0000  Diet - low sodium heart healthy        10/08/23 0958           Cardiac diet DISCHARGE MEDICATION: Allergies as of 10/08/2023   No Known Allergies      Medication List     STOP taking these medications    Flonase  50 MCG/ACT nasal spray Generic drug: fluticasone    leptospermum manuka honey Pste paste   liver oil-zinc  oxide 40 % ointment Commonly known as: DESITIN   Ozempic (0.25 or 0.5 MG/DOSE) 2 MG/3ML Sopn Generic drug: Semaglutide(0.25 or 0.5MG /DOS)       TAKE these medications    albuterol  108 (90 Base) MCG/ACT inhaler Commonly known as: VENTOLIN  HFA Inhale 2 puffs into the lungs every 6 (six) hours as needed for wheezing or shortness of breath.   amiodarone  200 MG tablet Commonly known as: PACERONE  TAKE 1 TABLET(200 MG) BY MOUTH DAILY   amoxicillin -clavulanate 875-125 MG tablet Commonly known as: AUGMENTIN  Take 1 tablet by mouth every 12 (twelve) hours for 3 days.   ascorbic acid  500 MG tablet Commonly known as: VITAMIN C  Take 1 tablet (500 mg total) by mouth 2 (two) times daily.   docusate sodium  100 MG capsule Commonly  known as: COLACE Take 1 capsule (100 mg total) by mouth 2 (two) times daily.   doxycycline  100 MG tablet Commonly known as: VIBRA -TABS Take 1 tablet (100 mg total) by mouth every 12 (twelve) hours for 3 days.   feeding supplement Liqd Take 237 mLs by mouth 3 (three) times daily between meals.   fluticasone  furoate-vilanterol 200-25 MCG/ACT Aepb Commonly known as: BREO ELLIPTA  Inhale 1 puff into the lungs daily. Start taking on: October 09, 2023   folic acid  1 MG tablet Commonly known as: FOLVITE  Take 1 mg by mouth daily.   furosemide  40 MG tablet Commonly known as: LASIX  Take 1 tablet (40 mg total) by mouth daily for 7 days. What changed: See the new instructions.   gabapentin  300 MG capsule Commonly known as: NEURONTIN  Take 600 mg by mouth 3 (three) times daily.   hydroxychloroquine  200 MG tablet Commonly known as: PLAQUENIL  Take 400 mg by mouth daily.   Jardiance  10 MG Tabs tablet Generic drug: empagliflozin  Take 10 mg by mouth daily.   levothyroxine  150 MCG tablet Commonly known as: SYNTHROID  Take 150 mcg by mouth daily before breakfast.   lovastatin 20 MG tablet Commonly known as: MEVACOR Take 20 mg by mouth every evening.   methotrexate  2.5 MG tablet Commonly known as: RHEUMATREX Take 15 mg by mouth once a week.  metoprolol  tartrate 25 MG tablet Commonly known as: LOPRESSOR  Take 25 mg by mouth 2 (two) times daily. Pt stated she takes half in the morning amd half in the evening   polyethylene glycol 17 g packet Commonly known as: MIRALAX  / GLYCOLAX  Take 17 g by mouth daily.   potassium chloride  10 MEQ tablet Commonly known as: KLOR-CON  M Take 10 mEq by mouth daily.   predniSONE  1 MG tablet Commonly known as: DELTASONE  Take 5 tablets (5 mg total) by mouth 2 (two) times daily with a meal for 3 days, THEN 5 tablets (5 mg total) daily with breakfast for 3 days, THEN 1 tablet (1 mg total) daily with breakfast. Start taking on: October 08, 2023 What  changed: See the new instructions.   Tradjenta  5 MG Tabs tablet Generic drug: linagliptin  Take 5 mg by mouth daily.   Vitamin D  (Ergocalciferol ) 1.25 MG (50000 UNIT) Caps capsule Commonly known as: DRISDOL  Take 50,000 Units by mouth once a week.   warfarin 5 MG tablet Commonly known as: COUMADIN  Take 1 tablet (5 mg total) by mouth daily at 4 PM. Take 10 mg once daily on Mon, Wed, Thurs, Fri. Take 5 mg once daily all other days.               Discharge Care Instructions  (From admission, onward)           Start     Ordered   10/08/23 0000  Discharge wound care:       Comments: Follow with pcp   10/08/23 9041            Follow-up Information     Buren Rock HERO, MD Follow up in 1 week(s).   Specialty: Family Medicine Why: hospital follow up Contact information: [address] [place] [address] [phone]                Discharge Exam: Fredricka Weights   10/03/23 2342 10/05/23 0723  Weight: (!) 167.8 kg (!) 181.8 kg   General exam: Appears calm and comfortable, morbid obesity. Respiratory system: Clear to auscultation, occasional wheezes. Respiratory effort normal. Cardiovascular system: S1 & S2 heard, RRR. No JVD, murmurs, rubs, gallops or clicks.  Gastrointestinal system: Abdomen is nondistended, soft and nontender. No organomegaly or masses felt. Normal bowel sounds heard.  Severe abdominal obesity, abdominal skin edema improved.  No tenderness. Central nervous system: Alert and oriented. No focal neurological deficits. Extremities: Post left AKA, right leg has no edema. Skin: No rashes, lesions or ulcers Psychiatry: Judgement and insight appear normal. Mood & affect appropriate.    Condition at discharge: fair  The results of significant diagnostics from this hospitalization (including imaging, microbiology, ancillary and laboratory) are listed below for reference.   Imaging Studies: DG Chest Portable 1 View Result Date:  10/04/2023 CLINICAL DATA:  Sepsis, wheezing EXAM: PORTABLE CHEST 1 VIEW COMPARISON:  08/09/2022 FINDINGS: Stable cardiomegaly. Aortic atherosclerosis. Mild vascular congestion. No confluent opacities or overt edema. No effusions. No acute bony abnormality. IMPRESSION: Cardiomegaly, vascular congestion. Electronically Signed   By: Franky Crease M.D.   On: 10/04/2023 03:49   CT ABDOMEN PELVIS W CONTRAST Result Date: 10/04/2023 CLINICAL DATA:  Fatigue, chills, nausea, vomiting, abdominal pain EXAM: CT ABDOMEN AND PELVIS WITH CONTRAST TECHNIQUE: Multidetector CT imaging of the abdomen and pelvis was performed using the standard protocol following bolus administration of intravenous contrast. RADIATION DOSE REDUCTION: This exam was performed according to the departmental dose-optimization program which includes automated exposure control,  adjustment of the mA and/or kV according to patient size and/or use of iterative reconstruction technique. CONTRAST:  OMNIPAQUE  IOHEXOL  350 MG/ML SOLN COMPARISON:  08/14/2022 FINDINGS: Lower chest: No acute abnormality. Hepatobiliary: Hepatic steatosis. Cholelithiasis. No evidence of acute cholecystitis. Pancreas: Unremarkable. Spleen: Unremarkable. Adrenals/Urinary Tract: Stable adrenal glands. No urinary calculi or hydronephrosis. Unremarkable bladder. Stomach/Bowel: No bowel obstruction or bowel wall thickening. Normal appendix. Stomach is within normal limits. Vascular/Lymphatic: Aortic atherosclerotic calcification. No lymphadenopathy. Reproductive: Calcified uterine fibroids.  No adnexal mass. Other: No free intraperitoneal fluid or air. Musculoskeletal: No acute fracture. Similar advanced disc space height loss and degenerative endplate changes at L5-S1. Diffuse skin thickening and subcutaneous edema involving the pannus is similar to prior. This is incompletely imaged. Correlate for cellulitis. No abscess or soft tissue gas. IMPRESSION: 1. No acute abnormality in the  abdomen or pelvis. 2. Diffuse skin thickening and subcutaneous edema involving the pannus is similar to prior. This is incompletely imaged. Correlate for cellulitis. 3. Hepatic steatosis. 4. Cholelithiasis. 5. Aortic Atherosclerosis (ICD10-I70.0). Electronically Signed   By: Norman Gatlin M.D.   On: 10/04/2023 02:43   CT HEAD WO CONTRAST ( ) Result Date: 10/04/2023 CLINICAL DATA:  Mental status change, unknown cause EXAM: CT HEAD WITHOUT CONTRAST TECHNIQUE: Contiguous axial images were obtained from the base of the skull through the vertex without intravenous contrast. RADIATION DOSE REDUCTION: This exam was performed according to the departmental dose-optimization program which includes automated exposure control, adjustment of the mA and/or kV according to patient size and/or use of iterative reconstruction technique. COMPARISON:  05/10/2022 FINDINGS: Brain: No acute intracranial abnormality. Specifically, no hemorrhage, hydrocephalus, mass lesion, acute infarction, or significant intracranial injury. Vascular: No hyperdense vessel or unexpected calcification. Skull: No acute calvarial abnormality. Sinuses/Orbits: No acute findings Other: None IMPRESSION: No acute intracranial abnormality. Electronically Signed   By: Franky Crease M.D.   On: 10/04/2023 01:03    Microbiology: Results for orders placed or performed during the hospital encounter of 10/04/23  Resp panel by RT-PCR (RSV, Flu A&B, Covid) Anterior Nasal Swab     Status: None   Collection Time: 10/03/23 11:44 PM   Specimen: Anterior Nasal Swab  Result Value Ref Range Status   SARS Coronavirus 2 by RT PCR NEGATIVE NEGATIVE Final    Comment: (NOTE) SARS-CoV-2 target nucleic acids are NOT DETECTED.  The SARS-CoV-2 RNA is generally detectable in upper respiratory specimens during the acute phase of infection. The lowest concentration of SARS-CoV-2 viral copies this assay can detect is 138 copies/mL. A negative result does not preclude  SARS-Cov-2 infection and should not be used as the sole basis for treatment or other patient management decisions. A negative result may occur with  improper specimen collection/handling, submission of specimen other than nasopharyngeal swab, presence of viral mutation(s) within the areas targeted by this assay, and inadequate number of viral copies(<138 copies/mL). A negative result must be combined with clinical observations, patient history, and epidemiological information. The expected result is Negative.  Fact Sheet for Patients:  BloggerCourse.com  Fact Sheet for Healthcare Providers:  SeriousBroker.it  This test is no t yet approved or cleared by the [place]  FDA and  has been authorized for detection and/or diagnosis of SARS-CoV-2 by FDA under an Emergency Use Authorization (EUA). This EUA will remain  in effect (meaning this test can be used) for the duration of the COVID-19 declaration under Section 564(b)(1) of the Act, 21 U.S.C.section 360bbb-3(b)(1), unless the authorization is terminated  or revoked sooner.  Influenza A by PCR NEGATIVE NEGATIVE Final   Influenza B by PCR NEGATIVE NEGATIVE Final    Comment: (NOTE) The Xpert Xpress SARS-CoV-2/FLU/RSV plus assay is intended as an aid in the diagnosis of influenza from Nasopharyngeal swab specimens and should not be used as a sole basis for treatment. Nasal washings and aspirates are unacceptable for Xpert Xpress SARS-CoV-2/FLU/RSV testing.  Fact Sheet for Patients: BloggerCourse.com  Fact Sheet for Healthcare Providers: SeriousBroker.it  This test is not yet approved or cleared by the [place]  FDA and has been authorized for detection and/or diagnosis of SARS-CoV-2 by FDA under an Emergency Use Authorization (EUA). This EUA will remain in effect (meaning this test can be used) for the duration of  the COVID-19 declaration under Section 564(b)(1) of the Act, 21 U.S.C. section 360bbb-3(b)(1), unless the authorization is terminated or revoked.     Resp Syncytial Virus by PCR NEGATIVE NEGATIVE Final    Comment: (NOTE) Fact Sheet for Patients: BloggerCourse.com  Fact Sheet for Healthcare Providers: SeriousBroker.it  This test is not yet approved or cleared by the [place]  FDA and has been authorized for detection and/or diagnosis of SARS-CoV-2 by FDA under an Emergency Use Authorization (EUA). This EUA will remain in effect (meaning this test can be used) for the duration of the COVID-19 declaration under Section 564(b)(1) of the Act, 21 U.S.C. section 360bbb-3(b)(1), unless the authorization is terminated or revoked.  Performed at [facility], [address] [place], [place] [address]   Urine Culture     Status: None   Collection Time: 10/04/23  1:52 AM   Specimen: Urine, Clean Catch  Result Value Ref Range Status   Specimen Description   Final    URINE, CLEAN CATCH Performed at [facility], [address] [place], [place] [address]    Special Requests   Final    NONE Performed at [facility], [address] [place], [place] [address]    Culture   Final    NO GROWTH Performed at [facility], [address], [place], [place] [address]    Report Status 10/05/2023 FINAL  Final  Blood culture (routine x 2)     Status: None (Preliminary result)   Collection Time: 10/04/23  1:52 AM   Specimen: BLOOD  Result Value Ref Range Status   Specimen Description BLOOD RIGHT ARM  Final   Special Requests   Final    BOTTLES DRAWN AEROBIC AND ANAEROBIC Blood Culture results may not be optimal due to an inadequate volume of blood received in culture bottles   Culture   Final    NO GROWTH 4 DAYS Performed at [facility], [address], [place], [place] [address]    Report  Status PENDING  Incomplete  Blood culture (routine x 2)     Status: None (Preliminary result)   Collection Time: 10/04/23  1:52 AM   Specimen: BLOOD  Result Value Ref Range Status   Specimen Description BLOOD RIGHT ARM  Final   Special Requests   Final    BOTTLES DRAWN AEROBIC AND ANAEROBIC Blood Culture results may not be optimal due to an inadequate volume of blood received in culture bottles   Culture   Final    NO GROWTH 4 DAYS Performed at [facility], [address] [place], [place] [address]    Report Status PENDING  Incomplete  MRSA Next Gen by PCR, Nasal     Status: None   Collection Time:  10/05/23  7:17 AM   Specimen: Nasal Mucosa; Nasal Swab  Result Value Ref Range Status   MRSA by PCR Next Gen NOT DETECTED NOT DETECTED Final    Comment: (NOTE) The GeneXpert MRSA Assay (FDA approved for NASAL specimens only), is one component of a comprehensive MRSA colonization surveillance program. It is not intended to diagnose MRSA infection nor to guide or monitor treatment for MRSA infections. Test performance is not FDA approved in patients less than 59 years old. Performed at [facility], [address] [place], [place] [address]     Labs: CBC: Recent Labs  Lab 10/03/23 2344 10/05/23 0622 10/06/23 0413 10/07/23 0604  WBC 2.4* 11.0* 13.8* 13.3*  NEUTROABS 1.8  --   --   --   HGB 14.8 12.4 11.5* 11.2*  HCT 46.7* 37.6 36.2 33.6*  MCV 97.1 94.9 97.3 94.4  PLT 179 152 145* 156   Basic Metabolic Panel: Recent Labs  Lab 10/03/23 2344 10/05/23 0622 10/06/23 0413 10/07/23 0604 10/08/23 0555  NA 141 141 141 141 141  K 3.9 3.7 3.9 3.9 3.8  CL 105 108 109 109 108  CO2 23 23 24 26 27   GLUCOSE 146* 142* 128* 115* 146*  BUN 20 22 24* 22 25*  CREATININE 1.20* 1.25* 1.03* 1.04* 0.87  CALCIUM 8.4* 8.4* 8.9 8.7* 8.6*  MG  --  2.0 1.9  --  2.2  PHOS  --   --  2.5  --   --    Liver Function Tests: Recent Labs  Lab 10/03/23 2344 10/05/23 0622   AST 22 16  ALT 11 9  ALKPHOS 30* 33*  BILITOT 1.0 0.6  PROT 6.5 5.7*  ALBUMIN  3.1* 2.4*   CBG: Recent Labs  Lab 10/05/23 0653  GLUCAP 123*    Discharge time spent: 35 minutes.  Signed: Murvin Mana, MD [facility] 10/08/2023

## 2023-10-08 NOTE — Progress Notes (Signed)
  Echocardiogram 2D Echocardiogram has NOT been performed. Upon arrival to the patient's room, I was informed the patient will be getting transferred out.   Kayla Reyes Louder 10/08/2023, 11:42 AM

## 2023-10-08 NOTE — Consult Note (Signed)
 PHARMACY - ANTICOAGULATION CONSULT NOTE  Pharmacy Consult for warfarin dosage adjustment  Indication: atrial fibrillation and history of arterial embolism s/p L AKA  No Known Allergies  Patient Measurements: Height: 5' 7 (170.2 cm) Weight: (!) 181.8 kg (400 lb 12.7 oz) IBW/kg (Calculated) : 61.6 HEPARIN  DW (KG): 108.4  Vital Signs: Temp: 98.5 F (36.9 C) (08/24 0414) Temp Source: Oral (08/24 0414) BP: 99/57 (08/24 0414) Pulse Rate: 71 (08/24 0414)  Labs: Recent Labs    10/06/23 0413 10/07/23 0604 10/08/23 0555  HGB 11.5* 11.2*  --   HCT 36.2 33.6*  --   PLT 145* 156  --   LABPROT 47.0* 53.3* 37.2*  INR 4.8* 5.6* 3.6*  CREATININE 1.03* 1.04* 0.87    Estimated Creatinine Clearance: 102.7 mL/min (by C-G formula based on SCr of 0.87 mg/dL).   Medical History: Past Medical History:  Diagnosis Date   Arterial thrombosis (HCC)    a. 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 noted 12/19 in the setting of hypokalemia; b. CHADS2VASc 4 (HTN, age x 1, vascular disease, female); c. on chronic Coumadin  in the setting of prior arterial clot   Rheumatoid arthritis (HCC)     Medications:  Scheduled:   amiodarone   200 mg Oral Daily   amoxicillin -clavulanate  1 tablet Oral Q12H   ascorbic acid   500 mg Oral BID   Chlorhexidine  Gluconate Cloth  6 each Topical Daily   doxycycline   100 mg Oral Q12H   empagliflozin   10 mg Oral Daily   feeding supplement  237 mL Oral TID BM   fluticasone  furoate-vilanterol  1 puff Inhalation Daily   folic acid   1 mg Oral Daily   furosemide   40 mg Oral BID   gabapentin   600 mg Oral TID   levothyroxine   150 mcg Oral Q0600   linagliptin   5 mg Oral Daily   [START ON 10/09/2023] methotrexate   10 mg Oral Weekly   metoprolol  tartrate  25 mg Oral  BID   polyethylene glycol  17 g Oral Daily   potassium chloride   10 mEq Oral Daily   pravastatin   20 mg Oral q1800   predniSONE   10 mg Oral BID WC   senna-docusate  2 tablet Oral BID   [START ON 10/10/2023] Vitamin D  (Ergocalciferol )  50,000 Units Oral Weekly   Warfarin - Pharmacist Dosing Inpatient   Does not apply q1600   Infusions:    PRN: acetaminophen  **OR** acetaminophen , albuterol , magnesium  hydroxide, metoprolol  tartrate, ondansetron  **OR** ondansetron  (ZOFRAN ) IV, traZODone   Assessment: 73 year old female presenting with Afib in RVR and pancolitis. PMH hypothyroidism, RA, HTN, paroxysmal Afib on warfarin, L AKA. [place] VASc score of 5. Renal function improved and near baseline at Scr 1.03 mg/dL (8.74 on 1/78). Lactic acid improved from 4.1 mmol/L to 1.8 mmol/L (8/21). CBC notable for WBC of 13.8 K/uL, hemoglobin of 11.5 g/dL (87.5 on 1/78), and platelet count of 152 K/uL.   At home, patient takes warfarin 10 mg on MWF, and 5 mg on all other days. Notable interaction with amiodarone  (increased INR), cefepime  (increased bleeding risk), and prednisone  (increased bleeding risk). Patient takes amiodarone  at home.   Date INR Warfarin Dose  8/20 2.3 10 mg  8/21  3.9 Held   8/22  4.8 Held  8/23 5.6 Held  8/24 3.6      Goal of Therapy:  INR 2-3 Monitor platelets by anticoagulation protocol: Yes   Plan:  - Hold warfarin today in setting of elevated INR. Would expect INR to decrease tomorrow given last dose of warfarin on 8/20.  - Continue to monitor INR daily and CBC daily for hemoglobin and platelet count.   Bartow Zylstra A Surafel Hilleary, PharmD Clinical Pharmacist 10/08/2023 7:32 AM

## 2023-10-09 LAB — CULTURE, BLOOD (ROUTINE X 2)
Culture: NO GROWTH
Culture: NO GROWTH

## 2023-11-06 ENCOUNTER — Other Ambulatory Visit: Payer: Self-pay

## 2023-11-07 MED ORDER — AMIODARONE HCL 200 MG PO TABS: 200.0000 mg | ORAL_TABLET | Freq: Every day | ORAL | 2 refills | Status: AC

## 2023-12-12 ENCOUNTER — Encounter: Payer: Self-pay | Admitting: Cardiology

## 2023-12-12 ENCOUNTER — Ambulatory Visit: Attending: Cardiology | Admitting: Cardiology

## 2023-12-12 VITALS — BP 110/62 | HR 58 | Ht 67.0 in | Wt 377.4 lb

## 2023-12-12 DIAGNOSIS — I10 Essential (primary) hypertension: Secondary | ICD-10-CM | POA: Diagnosis not present

## 2023-12-12 DIAGNOSIS — I5032 Chronic diastolic (congestive) heart failure: Secondary | ICD-10-CM | POA: Diagnosis not present

## 2023-12-12 DIAGNOSIS — E782 Mixed hyperlipidemia: Secondary | ICD-10-CM

## 2023-12-12 DIAGNOSIS — I872 Venous insufficiency (chronic) (peripheral): Secondary | ICD-10-CM

## 2023-12-12 DIAGNOSIS — Z79899 Other long term (current) drug therapy: Secondary | ICD-10-CM | POA: Diagnosis not present

## 2023-12-12 DIAGNOSIS — I4819 Other persistent atrial fibrillation: Secondary | ICD-10-CM

## 2023-12-12 DIAGNOSIS — Z8739 Personal history of other diseases of the musculoskeletal system and connective tissue: Secondary | ICD-10-CM

## 2023-12-12 DIAGNOSIS — Z86718 Personal history of other venous thrombosis and embolism: Secondary | ICD-10-CM

## 2023-12-12 NOTE — Patient Instructions (Signed)
 Medication Instructions:  Your physician recommends that you continue on your current medications as directed. Please refer to the Current Medication list given to you today.   *If you need a refill on your cardiac medications before your next appointment, please call your pharmacy*  Lab Work: No labs ordered today  If you have labs (blood work) drawn today and your tests are completely normal, you will receive your results only by: MyChart Message (if you have MyChart) OR A paper copy in the mail If you have any lab test that is abnormal or we need to change your treatment, we will call you to review the results.  Testing/Procedures: No test ordered today   Follow-Up: At [facility], you and your health needs are our priority.  As part of our continuing mission to provide you with exceptional heart care, our providers are all part of one team.  This team includes your primary Cardiologist (physician) and Advanced Practice Providers or APPs (Physician Assistants and Nurse Practitioners) who all work together to provide you with the care you need, when you need it.  Your next appointment:   6 month(s)  Provider:   You may see Deatrice Cage, MD or one of the following Advanced Practice Providers on your designated Care Team:   Tylene Lunch, NP

## 2023-12-12 NOTE — Progress Notes (Signed)
 Cardiology Office Note   Date:  12/12/2023  ID:  Kayla Reyes, DOB Jun 22, 1950, MRN [id number] PCP: Kayla Reyes HERO, MD  [facility] Providers Cardiologist:  Kayla Cage, MD     History of Present Illness Kayla Reyes is a 73 y.o. 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   Prior echocardiogram done 12/23 showed an EF of 55 to 60%.  Emergency department visit in 04/2022 for unresponsiveness.  Per EMS patient was oriented to person and event which was different from her baseline.  She was experiencing chest pain, shortness of breath, and abdominal pain with nausea and vomiting.  She had audible wheezing and appeared mildly dyspneic with baseline.  Additionally requiring admission to the ICU for severe sepsis requiring pressors.  She was found to be in atrial fibrillation with RVR.  She underwent DCCV 05/13/2022.  Unfortunately she reverted back into atrial fibrillation.  She was started on amiodarone  with subsequent conversion to sinus rhythm.  Workup was overall unrevealing aside from infection under the patient's large pannus.  She was treated with broad-spectrum antibiotics, weaned off pressors, discharged 05/19/2022.  She was seen in follow-up 06/22/2022 and overall was doing well with cardiac perspective.  She was maintaining sinus rhythm.  No further testing or medication changes were indicated at that time.  She was admitted again 08/09/2022 - 08/16/2022 for sepsis secondary to infective panniculitis.  She was treated With broad-spectrum antibiotics.  Noted to have abdominal  wall edema which was treated with IV furosemide .  She was evaluated in clinic 09/29/2022 and was overall doing well with cardiac perspective without chest pain, dyspnea or palpitations.  She was maintaining sinus rhythm.  No further testing or medication changes were indicated.  She was evaluated in clinic 06/16/2023 stating that she was doing well but did have rare episodes of heart fluttering.  She noted that her lower legs were swelling intermittently which resolved by elevating limbs.  She was also wearing compression stockings up with chronic venous insufficiency.  She denied any chest pain, lightheadedness or dizziness.  She was sent for updated labs today amiodarone  was continued on her current medication regimen.   Patient was hospitalized at [facility] 8/20 - 10/04/2023 for panniculitis.  She presented with lower abdominal pain with associated induration, swelling, warmth, tenderness.  She was started on IV antibiotics with vancomycin  and Zosyn for panniculitis.  She developed significant lactic acidosis, received fluid bolus and additional antibiotics.  She developed a fever in the morning 08/21, blood pressure was low.  She received fluid resuscitation and her lactic acid normalized on 8/21.  She subsequently developed acute on chronic diastolic heart failure and was diuresed with IV furosemide  volume she was also given steroids.  IV antibiotics were changed to oral Augmentin  doxycycline .  Echocardiogram was completed which revealed an LVEF of 55 to 60%, no RWMA, and no valvular abnormalities were noted.  She was considered stable and was discharged home on 10/04/2023.  She returns to clinic today accompanied by her son.  She states she has been doing well from a cardiac perspective.  She denies any chest pain or worsening shortness of breath.  She states that she occasionally continues to have wheezing  that is unchanged.  She continues to report rare bouts of palpitations.  States that her medications have  remained the same but she is requesting refills of her warfarin today as during hospitalization warfarin dosing was decreased during her recent hospitalization but then she was put back on her regular dosing and she is having to take additional pills which she says is confusing.  She has been compliant with the remainder of her medications without any further issues.  ROS: 10 point review of systems has been reviewed and considered negative with exception was been listed in the HPI  Studies Reviewed EKG Interpretation Date/Time:  Tuesday December 12 2023 15:10:38 EDT Ventricular Rate:  58 PR Interval:  148 QRS Duration:  90 QT Interval:  524 QTC Calculation: 514 R Axis:   78  Text Interpretation: Sinus bradycardia Nonspecific T wave abnormality When compared with ECG of 05-Oct-2023 07:14, Confirmed by Kayla Reyes ([id number]) on 12/12/2023 3:19:17 PM    Cardiac Studies & Procedures   ______________________________________________________________________________________________     ECHOCARDIOGRAM  ECHOCARDIOGRAM COMPLETE 05/10/2022  Narrative ECHOCARDIOGRAM REPORT    Patient Name:   Kayla Reyes Date of Exam: 05/10/2022 Medical Rec #:  [id number]      Height:       67.0 in Accession #:    [id number]     Weight:       368.4 lb Date of Birth:  1951/01/25       BSA:          2.622 m Patient Age:    71 years       BP:           90/65 mmHg Patient Gender: F              HR:           90 bpm. Exam Location:  [facility]  Procedure: 2D Echo, Cardiac Doppler and Color Doppler  Indications:     R94.31 Abnormal EKG  History:         Patient has prior history of Echocardiogram examinations, most recent 02/08/2018. Arrythmias:Paroxysmal Atrial Fibrillation; Risk Factors:Dyslipidemia and Hypertension.  Sonographer:     Kayla Reyes RDCS Referring Phys:  [id number] Kayla Reyes Diagnosing Phys: Kayla Cage MD  IMPRESSIONS   1. Left ventricular ejection fraction, by estimation, is  55 to 60%. The left ventricle has normal function. The left ventricle has no regional wall motion abnormalities. Left ventricular diastolic parameters were normal. 2. Right ventricular systolic function is normal. The right ventricular size is normal. There is normal pulmonary artery systolic pressure. 3. The mitral valve is normal in structure. No evidence of mitral valve regurgitation. No evidence of mitral stenosis. 4. The aortic valve is normal in structure. Aortic valve regurgitation is not visualized. Aortic valve sclerosis is present, with no evidence of aortic valve stenosis.  FINDINGS Left Ventricle: Left ventricular ejection fraction, by estimation, is 55 to 60%. The left ventricle has normal function. The left ventricle has no regional wall motion abnormalities. The left ventricular internal cavity size was normal in size. There is no left ventricular hypertrophy. Left ventricular diastolic parameters were normal.  Right Ventricle: The right ventricular size is normal. No increase in right ventricular wall thickness. Right ventricular systolic function is normal. There is normal pulmonary artery systolic pressure. The tricuspid regurgitant velocity is 2.01 m/s, and with an assumed right atrial pressure of 5 mmHg, the estimated right ventricular systolic pressure is 21.2 mmHg.  Left Atrium: Left atrial size was  normal in size.  Right Atrium: Right atrial size was normal in size.  Pericardium: There is no evidence of pericardial effusion.  Mitral Valve: The mitral valve is normal in structure. No evidence of mitral valve regurgitation. No evidence of mitral valve stenosis.  Tricuspid Valve: The tricuspid valve is normal in structure. Tricuspid valve regurgitation is mild . No evidence of tricuspid stenosis.  Aortic Valve: The aortic valve is normal in structure. Aortic valve regurgitation is not visualized. Aortic valve sclerosis is present, with no evidence of aortic valve  stenosis.  Pulmonic Valve: The pulmonic valve was normal in structure. Pulmonic valve regurgitation is not visualized. No evidence of pulmonic stenosis.  Aorta: The aortic root is normal in size and structure.  Venous: The inferior vena cava was not well visualized.  IAS/Shunts: No atrial level shunt detected by color flow Doppler.   LEFT VENTRICLE PLAX 2D LVIDd:         4.50 cm Diastology LVIDs:         3.00 cm LV e' medial:    9.68 cm/s LV PW:         0.90 cm LV E/e' medial:  7.8 LV IVS:        0.90 cm LV e' lateral:   6.31 cm/s LV E/e' lateral: 12.0   RIGHT VENTRICLE RV Basal diam:  3.50 cm RV S prime:     13.73 cm/s TAPSE (M-mode): 2.6 cm  LEFT ATRIUM             Index        RIGHT ATRIUM           Index LA diam:        4.00 cm 1.53 cm/m   RA Area:     13.70 cm LA Vol (A2C):   56.4 ml 21.51 ml/m  RA Volume:   38.50 ml  14.69 ml/m LA Vol (A4C):   62.2 ml 23.73 ml/m LA Biplane Vol: 62.7 ml 23.92 ml/m AORTIC VALVE LVOT Vmax:   101.10 cm/s LVOT Vmean:  67.533 cm/s LVOT VTI:    0.153 m  AORTA Ao Root diam: 2.70 cm Ao Asc diam:  3.00 cm  MITRAL VALVE               TRICUSPID VALVE MV Area (PHT): 4.49 cm    TR Peak grad:   16.2 mmHg MV Decel Time: 169 msec    TR Vmax:        201.00 cm/s MV E velocity: 75.50 cm/s MV A velocity: 88.05 cm/s  SHUNTS MV E/A ratio:  0.86        Systemic VTI: 0.15 m  Kayla Cage MD Electronically signed by Kayla Cage MD Signature Date/Time: 05/11/2022/12:08:53 PM    Final          ______________________________________________________________________________________________      Risk Assessment/Calculations  CHA2DS2-VASc Score =     This indicates a  % annual risk of stroke. The patient's score is based upon:             Physical Exam VS:  BP 110/62 (BP Location: Left Arm, Patient Position: Sitting, Cuff Size: Large)   Pulse (!) 58   Ht 5' 7 (1.702 m)   Wt (!) 377 lb 6 oz (171.2 kg)   SpO2 98%   BMI  59.11 kg/m        Wt Readings from Last 3 Encounters:  12/12/23 (!) 377 lb 6 oz (171.2 kg)  10/05/23 (!) 400 lb 12.7  oz (181.8 kg)  06/16/23 (!) 375 lb (170.1 kg)    GEN: Well nourished, well developed in no acute distress, wheelchair-bound NECK: No JVD; No carotid bruits CARDIAC: RRR, no murmurs, rubs, gallops RESPIRATORY:  Clear to auscultation without rales, wheezing or rhonchi  ABDOMEN: Soft, non-tender, obese non-distended EXTREMITIES:  No edema; left BKA no deformity   ASSESSMENT AND PLAN Persistent atrial fibrillation which she is currently maintaining sinus bradycardia on EKG today with nonspecific T wave abnormalities with no acute ischemic changes noted.  She is continued on amiodarone  200 mg daily, she is also continued on warfarin for CHA2DS2-VASc score of at least 5 for stroke prophylaxis with her PT/INR being managed by her PCP.  We are reaching out for her PCP today at request of the patient to have new warfarin prescription sent to the pharmacy of choice for the updated milligram dosage of Coumadin  to allow for ease of dosing at home.  She denies any bleeding with no blood noted in her urine or stool.  Chronic HFpEF she denies any change in her shortness of breath today.  LVEF the last echocardiogram was 55 to 60% without RWMA and no valvular abnormalities were noted.  She is continued on Jardiance  10 mg daily and furosemide  40 mg daily and metoprolol  tartrate 12.5 mg twice daily.  It is difficult to determine volume status given morbid obesity.  Essential hypertension with blood pressure today 110/62.  Blood pressure remains stable on current medication regimen.  Patient has been advised to continue to monitor pressures 1 to 2 hours postmedication ministration at home as well.  Mixed hyperlipidemia which she is continued on lovastatin.  LDL 74.  Ongoing management per PCP.  Chronic venous insufficiency continues to remain stable.  Encouraged to continue with compression  stocking on remaining extremity.  History of arterial embolism status post L BKA on long-term anticoagulation with warfarin therapy managed by PCP.  Morbid obesity with inability to be active due to previous amputation.  She continues to be wheelchair-bound.  History of recurrent panniculitis and sepsis with recurrent hospitalizations and just recently discharged in August 2024.  She has not had recurrent hospitalizations since that time.  She required IV antibiotics and IV fluid and then IV diuretics.  Has done well since her last hospital discharge.       Dispo: Patient to return to clinic with MD/APP in 6 months or sooner if needed for further evaluation  Signed, Maksim Peregoy, NP

## 2023-12-18 ENCOUNTER — Other Ambulatory Visit: Payer: Self-pay | Admitting: Otolaryngology

## 2023-12-18 DIAGNOSIS — H90A21 Sensorineural hearing loss, unilateral, right ear, with restricted hearing on the contralateral side: Secondary | ICD-10-CM

## 2023-12-20 ENCOUNTER — Encounter: Payer: Self-pay | Admitting: Otolaryngology

## 2023-12-20 ENCOUNTER — Other Ambulatory Visit: Payer: Self-pay | Admitting: Otolaryngology

## 2023-12-20 DIAGNOSIS — H90A21 Sensorineural hearing loss, unilateral, right ear, with restricted hearing on the contralateral side: Secondary | ICD-10-CM

## 2023-12-28 ENCOUNTER — Ambulatory Visit

## 2024-01-24 ENCOUNTER — Ambulatory Visit: Admission: RE | Admit: 2024-01-24 | Discharge: 2024-01-24 | Attending: Otolaryngology | Admitting: Otolaryngology

## 2024-01-24 DIAGNOSIS — H90A21 Sensorineural hearing loss, unilateral, right ear, with restricted hearing on the contralateral side: Secondary | ICD-10-CM | POA: Diagnosis present

## 2024-01-24 MED ORDER — GADOBUTROL 1 MMOL/ML IV SOLN
10.0000 mL | Freq: Once | INTRAVENOUS | Status: AC | PRN
  Administered 2024-01-24: 10 mL via INTRAVENOUS
